# Patient Record
Sex: Female | Born: 1950 | Race: White | Hispanic: No | Marital: Married | Smoking: Never smoker
Health system: Southern US, Community
[De-identification: ages and names within clinical notes are randomized; demographics above are authoritative.]

## PROBLEM LIST (undated history)

## (undated) DIAGNOSIS — N1370 Vesicoureteral-reflux, unspecified: Secondary | ICD-10-CM

## (undated) DIAGNOSIS — N189 Chronic kidney disease, unspecified: Secondary | ICD-10-CM

## (undated) DIAGNOSIS — N12 Tubulo-interstitial nephritis, not specified as acute or chronic: Secondary | ICD-10-CM

## (undated) DIAGNOSIS — D473 Essential (hemorrhagic) thrombocythemia: Secondary | ICD-10-CM

## (undated) DIAGNOSIS — F419 Anxiety disorder, unspecified: Secondary | ICD-10-CM

## (undated) DIAGNOSIS — M8580 Other specified disorders of bone density and structure, unspecified site: Secondary | ICD-10-CM

## (undated) DIAGNOSIS — N2889 Other specified disorders of kidney and ureter: Secondary | ICD-10-CM

## (undated) DIAGNOSIS — D509 Iron deficiency anemia, unspecified: Secondary | ICD-10-CM

## (undated) DIAGNOSIS — G4730 Sleep apnea, unspecified: Secondary | ICD-10-CM

## (undated) DIAGNOSIS — M7541 Impingement syndrome of right shoulder: Secondary | ICD-10-CM

## (undated) DIAGNOSIS — D72829 Elevated white blood cell count, unspecified: Secondary | ICD-10-CM

## (undated) HISTORY — DX: Essential (hemorrhagic) thrombocythemia: D47.3

## (undated) HISTORY — PX: APPENDECTOMY: SHX54

## (undated) HISTORY — DX: Tubulo-interstitial nephritis, not specified as acute or chronic: N12

## (undated) HISTORY — DX: Other specified disorders of kidney and ureter: N28.89

## (undated) HISTORY — PX: TONSILLECTOMY: SUR1361

## (undated) HISTORY — DX: Vesicoureteral-reflux, unspecified: N13.70

## (undated) HISTORY — DX: Other specified disorders of bone density and structure, unspecified site: M85.80

## (undated) HISTORY — DX: Iron deficiency anemia, unspecified: D50.9

## (undated) HISTORY — DX: Elevated white blood cell count, unspecified: D72.829

## (undated) HISTORY — PX: KIDNEY SURGERY: SHX687

## (undated) HISTORY — PX: CHOLECYSTECTOMY: SHX55

## (undated) HISTORY — PX: ABDOMINAL HYSTERECTOMY: SHX81

## (undated) HISTORY — PX: KNEE SURGERY: SHX244

## (undated) HISTORY — PX: GASTRIC BYPASS: SHX52

---

## 1998-03-08 ENCOUNTER — Ambulatory Visit (HOSPITAL_COMMUNITY): Admission: RE | Admit: 1998-03-08 | Discharge: 1998-03-08 | Payer: Self-pay | Admitting: Specialist

## 1998-06-24 ENCOUNTER — Inpatient Hospital Stay (HOSPITAL_COMMUNITY): Admission: EM | Admit: 1998-06-24 | Discharge: 1998-06-29 | Payer: Self-pay | Admitting: Emergency Medicine

## 1998-06-24 ENCOUNTER — Encounter: Payer: Self-pay | Admitting: Emergency Medicine

## 1998-06-25 ENCOUNTER — Encounter: Payer: Self-pay | Admitting: Internal Medicine

## 1998-11-22 ENCOUNTER — Emergency Department (HOSPITAL_COMMUNITY): Admission: EM | Admit: 1998-11-22 | Discharge: 1998-11-22 | Payer: Self-pay | Admitting: Emergency Medicine

## 1998-11-22 ENCOUNTER — Encounter: Payer: Self-pay | Admitting: Emergency Medicine

## 2000-04-09 ENCOUNTER — Inpatient Hospital Stay (HOSPITAL_COMMUNITY): Admission: AD | Admit: 2000-04-09 | Discharge: 2000-04-09 | Payer: Self-pay | Admitting: Obstetrics

## 2000-04-11 ENCOUNTER — Inpatient Hospital Stay (HOSPITAL_COMMUNITY): Admission: AD | Admit: 2000-04-11 | Discharge: 2000-04-11 | Payer: Self-pay | Admitting: Obstetrics

## 2000-04-16 ENCOUNTER — Inpatient Hospital Stay (HOSPITAL_COMMUNITY): Admission: AD | Admit: 2000-04-16 | Discharge: 2000-04-16 | Payer: Self-pay | Admitting: Obstetrics

## 2000-04-16 ENCOUNTER — Encounter: Payer: Self-pay | Admitting: Obstetrics

## 2000-04-26 ENCOUNTER — Ambulatory Visit (HOSPITAL_COMMUNITY): Admission: RE | Admit: 2000-04-26 | Discharge: 2000-04-26 | Payer: Self-pay | Admitting: Urology

## 2000-04-26 ENCOUNTER — Encounter: Payer: Self-pay | Admitting: Urology

## 2001-01-07 ENCOUNTER — Inpatient Hospital Stay (HOSPITAL_COMMUNITY): Admission: AD | Admit: 2001-01-07 | Discharge: 2001-01-07 | Payer: Self-pay | Admitting: Urology

## 2001-12-02 ENCOUNTER — Emergency Department (HOSPITAL_COMMUNITY): Admission: EM | Admit: 2001-12-02 | Discharge: 2001-12-02 | Payer: Self-pay | Admitting: Emergency Medicine

## 2002-03-27 ENCOUNTER — Emergency Department (HOSPITAL_COMMUNITY): Admission: EM | Admit: 2002-03-27 | Discharge: 2002-03-27 | Payer: Self-pay | Admitting: Emergency Medicine

## 2003-07-11 ENCOUNTER — Emergency Department (HOSPITAL_COMMUNITY): Admission: EM | Admit: 2003-07-11 | Discharge: 2003-07-12 | Payer: Self-pay | Admitting: Emergency Medicine

## 2005-01-30 ENCOUNTER — Encounter: Admission: RE | Admit: 2005-01-30 | Discharge: 2005-04-02 | Payer: Self-pay

## 2005-08-07 ENCOUNTER — Encounter: Admission: RE | Admit: 2005-08-07 | Discharge: 2005-09-04 | Payer: Self-pay | Admitting: Psychiatry

## 2005-11-01 ENCOUNTER — Encounter: Admission: RE | Admit: 2005-11-01 | Discharge: 2005-11-01 | Payer: Self-pay | Admitting: Internal Medicine

## 2005-11-24 ENCOUNTER — Ambulatory Visit (HOSPITAL_COMMUNITY): Admission: RE | Admit: 2005-11-24 | Discharge: 2005-11-24 | Payer: Self-pay | Admitting: Gastroenterology

## 2005-11-24 ENCOUNTER — Encounter (INDEPENDENT_AMBULATORY_CARE_PROVIDER_SITE_OTHER): Payer: Self-pay | Admitting: *Deleted

## 2005-12-22 ENCOUNTER — Ambulatory Visit (HOSPITAL_COMMUNITY): Admission: RE | Admit: 2005-12-22 | Discharge: 2005-12-22 | Payer: Self-pay | Admitting: General Surgery

## 2005-12-27 ENCOUNTER — Ambulatory Visit (HOSPITAL_COMMUNITY): Admission: RE | Admit: 2005-12-27 | Discharge: 2005-12-27 | Payer: Self-pay | Admitting: General Surgery

## 2006-01-10 ENCOUNTER — Encounter: Admission: RE | Admit: 2006-01-10 | Discharge: 2006-04-10 | Payer: Self-pay | Admitting: General Surgery

## 2006-04-16 ENCOUNTER — Inpatient Hospital Stay (HOSPITAL_COMMUNITY): Admission: RE | Admit: 2006-04-16 | Discharge: 2006-04-18 | Payer: Self-pay | Admitting: General Surgery

## 2006-04-17 ENCOUNTER — Encounter: Payer: Self-pay | Admitting: Vascular Surgery

## 2006-04-23 ENCOUNTER — Encounter: Admission: RE | Admit: 2006-04-23 | Discharge: 2006-07-22 | Payer: Self-pay | Admitting: General Surgery

## 2006-07-07 ENCOUNTER — Emergency Department (HOSPITAL_COMMUNITY): Admission: EM | Admit: 2006-07-07 | Discharge: 2006-07-07 | Payer: Self-pay | Admitting: Emergency Medicine

## 2006-07-23 ENCOUNTER — Encounter: Admission: RE | Admit: 2006-07-23 | Discharge: 2006-10-21 | Payer: Self-pay | Admitting: General Surgery

## 2006-09-08 ENCOUNTER — Emergency Department (HOSPITAL_COMMUNITY): Admission: EM | Admit: 2006-09-08 | Discharge: 2006-09-08 | Payer: Self-pay | Admitting: Emergency Medicine

## 2007-02-14 ENCOUNTER — Encounter: Admission: RE | Admit: 2007-02-14 | Discharge: 2007-02-14 | Payer: Self-pay | Admitting: General Surgery

## 2007-05-16 ENCOUNTER — Encounter: Admission: RE | Admit: 2007-05-16 | Discharge: 2007-05-16 | Payer: Self-pay | Admitting: General Surgery

## 2010-08-19 NOTE — Op Note (Signed)
NAMEDECLYN, OFFIELD               ACCOUNT NO.:  [id number]   MEDICAL RECORD NO.:  [id number]          PATIENT TYPE:  INP   LOCATION:  0002                         FACILITY:  [facility]   PHYSICIAN:  Sharlet Salina T. Hoxworth, M.D.DATE OF BIRTH:  1950/12/23   DATE OF PROCEDURE:  04/16/2006  DATE OF DISCHARGE:                               OPERATIVE REPORT   PRE AND POSTOPERATIVE DIAGNOSIS:  Morbid obesity.   SURGICAL PROCEDURES:  Laparoscopic Roux-en-Y gastric bypass.   SURGEON:  Lorne Skeens. Hoxworth, M.D.   ASSISTANT:  Dr. Baruch Merl   ANESTHESIA:  General.   BRIEF HISTORY:  Selena May is a 60 year old female who presents with  longstanding morbid obesity unresponsive to medical management and  multiple comorbidities including sleep apnea and hypertension.  Following an extensive preoperative workup and discussion detailed  elsewhere, she is brought to the operating room for laparoscopic Roux-en-  Y gastric bypass.   DESCRIPTION OF OPERATION:  The patient brought to operating room and  placed in supine position on the operating table and general orotracheal  anesthesia was induced.  Foley catheter was placed.  She had received  preoperative IV antibiotics.  Lovenox of 40 mg had been given  subcutaneously.  The abdomen was widely sterilely prepped and draped.  Correct patient and procedure were verified.  The trocar sites were  infiltrated with local anesthesia prior to the incisions.  A 1 cm  incision was made in the left subcostal space and abdominal access  obtained with a 12 mm OptiVu trocar without difficulty and  pneumoperitoneum established.  The patient had previous open  cholecystectomy but there were no significant adhesions.  Under direct  vision, a 12 mm trocar was placed in the right subxiphoid area through  the falciform ligament another 12 mm trocar in the right upper abdomen.  A 12 mm trocar placed just to the left and above the umbilicus for the  camera port and  additional 5 mm trocar in the left flank.  The omentum  was elevated.  The transverse colon and mesocolon identified and the  ligament of Treitz clearly identified.  A 40-50 cm afferent limb was  carefully measured which allowed mesentery to be mobile enough for this  to go up over the transverse colon toward the edge of the liver.  The  small bowel was then divided at this point a single firing of the white  load 45 mm stapler.  The mesentery was mobilized a small distance  further with the harmonic scalpel.  A Penrose drain was sutured to the  end of the efferent limb for identification.  A 100 cm efferent limb was  then carefully measured.  At this point the jejunojejunostomy was  performed in side-to-side fashion with single firing of the Endo GIA  white load stapler through two enterotomies created with the harmonic  scalpel.  The staple line was inspected was intact without bleeding.  The common enterotomy was then closed from either end with running 2-0  Vicryl.  The mesenteric defect was then closed with a running 2-0 silk  suture.  The sutures and  staple lines were coated with Tisseel tissue  sealant.  The patient was then placed in reverse Trendelenburg and left  lobe of liver retracted through a 5 mm subxiphoid site using the  Nathanson's retractor with excellent exposure of the hiatus and stomach.  The angle of His was mobilized down along the left crus using the  harmonic scalpel.  A point along the lesser curve was then chosen for a  4-5 cm pouch.  The mesentery was dissected away from the lesser curve  using the harmonic scalpel and dissection carried at right angles to the  stomach toward the lesser sac.  We dissected for several centimeters  along the gastric wall without entering the free lesser sac and at this  point an Echelon 60 mm gold stapler was used to fire at right angles to  the lesser curve.  Little further dissection up beyond the staple line  did enter the  free lesser sac and the finger tractor was used to dissect  through the previously dissected area of the angle of His completely  freeing this.  Following this, two further firings of the Echelon 60 mm  blue load stapler were fired up through the previously dissected area of  the angle of His creating a nice small tubular pouch.  The last firing  was performed with the Ewald tube through the EG junction and was seen  not to impinge upon this.  The gastric remnant staple line was oversewn  with 2-0 silk.  The Roux limb was then brought up in anticolic fashion  and the candy cane facing to the patient's left was seen to come up to  the pouch without undue tension.  Then the anastomosis and created with  initial running posterior row of 2-0 Vicryl.  Following this,  enterotomies were made in the pouch and the jejunal limb with harmonic  scalpel and an approximately 2 cm anastomosis was created with the 45 mm  blue load stapler.  Staple lines were seen to be intact without  bleeding.  The common enterotomy was then closed with running 2-0 Vicryl  beginning at either end of the enterotomy and tied centrally.  The  anastomosis was then completed with an anterior row of seromuscular  running 2-0 Vicryl.  This last row was placed with the Ewald tube  advanced down through the anastomosis.  Following this, Peterson's  defect was closed suturing the edges of the Roux limb mesentery to the  transverse mesocolon and up over the transverse colon toward the gastric  remnant with 2-0 silk.  The abdomen was carefully inspected for  hemostasis and irrigated.  The Roux limb was clamped just distal to the  gastric pouch and the patient flattened and endoscopy was performed by  Dr. Colin Benton as dictated separately.  With the gastric pouch tightly  distended with air in the pouch under saline irrigation, there was no  evidence of leak.  The air was suctioned and saline suctioned. Hemostasis assured.  Tisseel was  used to coat the suture staple lines of  the gastrojejunostomy.  The Nathanson's retractor was carefully removed  all CO2 evacuated.  Trocars removed.  Sponge, needle and instrument  counts were correct.  Skin incisions were closed with staples.  Dry  sterile dressings were applied and the patient taken to recovery in good  condition.      Lorne Skeens. Hoxworth, M.D.  Electronically Signed     BTH/MEDQ  D:  04/16/2006  T:  04/16/2006  Job:  [id number]

## 2011-01-19 LAB — CBC
Hemoglobin: 12.6
RBC: 4.64
RDW: 15.1 — ABNORMAL HIGH
WBC: 11.8 — ABNORMAL HIGH

## 2011-01-19 LAB — DIFFERENTIAL
Basophils Absolute: 0.1
Lymphocytes Relative: 28
Lymphs Abs: 3.3
Monocytes Absolute: 0.7
Neutro Abs: 7.7

## 2011-11-30 ENCOUNTER — Encounter: Payer: Self-pay | Admitting: Family Medicine

## 2011-11-30 ENCOUNTER — Ambulatory Visit (INDEPENDENT_AMBULATORY_CARE_PROVIDER_SITE_OTHER): Payer: Managed Care, Other (non HMO) | Admitting: Family Medicine

## 2011-11-30 VITALS — BP 120/68 | HR 87 | Temp 98.4°F | Resp 17 | Ht 60.75 in | Wt 208.0 lb

## 2011-11-30 DIAGNOSIS — R0602 Shortness of breath: Secondary | ICD-10-CM

## 2011-11-30 DIAGNOSIS — Z0000 Encounter for general adult medical examination without abnormal findings: Secondary | ICD-10-CM

## 2011-11-30 DIAGNOSIS — N1370 Vesicoureteral-reflux, unspecified: Secondary | ICD-10-CM | POA: Insufficient documentation

## 2011-11-30 DIAGNOSIS — N2889 Other specified disorders of kidney and ureter: Secondary | ICD-10-CM

## 2011-11-30 DIAGNOSIS — R002 Palpitations: Secondary | ICD-10-CM

## 2011-11-30 HISTORY — DX: Other specified disorders of kidney and ureter: N28.89

## 2011-11-30 HISTORY — DX: Vesicoureteral-reflux, unspecified: N13.70

## 2011-11-30 NOTE — Addendum Note (Signed)
Addended by: Laren Boom on: 11/30/2011 03:55 PM   Modules accepted: Orders

## 2011-11-30 NOTE — Progress Notes (Signed)
CC: Selena May is a 61 y.o. female is here for Establish Care   Subjective: HPI:  Pleasant 61 year old who is here to establish care, has not had primary care physician for many years.  Acute complaint of a chest vibration sensation has been present for years however is been more noticeable and going for the last few weeks. It occurs every few minutes regardless of what she is doing, it can occur if she's active or just as frequently if she's resting. There has never been pain associated with it, she denies any motor or sensory disturbances when it is occurring, and denies shortness of breath surrounding these episodes. She denies any cardiac history other than being told she has a murmur. There's been no workup for this prior. Her caffeine consumption is mild to moderate. She's unsure about orthopnea she sits up in a chair because it helps her sleep for psychological reasons, regarding the passing of her husband years ago. She denies peripheral edema. She does endorse shortness of breath when walking distances such as the distance from store to her car. She also endorses what she believes to be chest tightness rarely interesting this can occur both at rest and with exertion. Denies fevers, chills, cough, wheezing, back pain, abdominal pain, GERD symptoms, bowel irregularities, nor hot flashes.  Most of her medical care has been provided by Dr. Lindley Magnus who is her nephrologist in [facility]. She tells me that there is a "spot" on her kidney it sounds like she may be admitted to the day hospital next week to have what I presume is a cystoscopy and possible biopsy. She tells me she carries a long-standing history of recurrent urinary tract infections.  History of what sounds to the gastric bypass surgery and she still is frustrated with inability to lose weight but there is no formal exercise routine.   Review Of Systems Outlined In HPI  Past Medical History  Diagnosis Date  . Renal mass 11/30/2011   . VUR (vesicoureteric reflux) 11/30/2011     Family History  Problem Relation Age of Onset  . Alzheimer's disease Mother   . COPD Mother   . Hypertension Mother   . Heart attack Mother   . Cancer Father     Lung  . Heart attack Sister   . Diabetes Sister   . Heart failure Sister   . Heart attack Brother   . Cancer Brother     Lung     History  Substance Use Topics  . Smoking status: Never Smoker   . Smokeless tobacco: Never Used  . Alcohol Use: No     Objective: Filed Vitals:   11/30/11 1505  BP: 120/68  Pulse: 87  Temp: 98.4 F (36.9 C)  Resp: 17    General: Alert and Oriented, No Acute Distress HEENT: Pupils equal, round, reactive to light. Conjunctivae clear.  External ears unremarkable, canals clear with intact TMs with appropriate landmarks.  Middle ear appears open without effusion. Pink inferior turbinates.  Moist mucous membranes, pharynx without inflammation nor lesions.  Neck supple without palpable lymphadenopathy nor abnormal masses. Lungs: Clear to auscultation bilaterally, no wheezing/ronchi/rales.  Comfortable work of breathing. Good air movement. Cardiac: Regular rate and rhythm. Normal S1/S2.  No murmurs, rubs, nor gallops.  Chest symptoms occurred while palpating radial pulse and no abnormal rhythm was noted. Bowel sounds noted while listening to her left second intercostal space just the sternum. Abdomen: soft and non tender without palpable masses. Extremities: No peripheral edema.  Strong peripheral pulses.  Mental Status: No depression, anxiety, nor agitation. Skin: Warm and dry.  Assessment & Plan: Selena May was seen today for establish care.  Diagnoses and associated orders for this visit:  Shortness of breath - CBC - B Nat Peptide  Palpitations - TSH - CBC - Basic Metabolic Panel (BMET) - B Nat Peptide - EKG 12-Lead  Analysis thyroid dysfunction, anemia, congestive heart failure, electrolyte dysfunction with the above labs. EKG was  performed interpreted by myself showing normal sinus rhythm normal rate a left axis normal intervals no pathologic Q waves or ST segment elevation or depression. Of note patient was experiencing vibration symptoms during the EKG tracing. Patient recognized the need for complete physical exam prefers to have blood work performed just prior to that as she is due for a lipid screening. Feel that this is appropriate given her normal EKG. She reports her last mammogram in 2005 she reports her last Pap smear 2005 both of which had no abnormalities for what she knows. Keep in mind possibility of hiatal hernia causing chest symptoms.   Return for CPE 2-4 weeks.  Requested Prescriptions    No prescriptions requested or ordered in this encounter

## 2011-12-06 ENCOUNTER — Telehealth: Payer: Self-pay | Admitting: *Deleted

## 2011-12-06 DIAGNOSIS — R002 Palpitations: Secondary | ICD-10-CM

## 2011-12-06 DIAGNOSIS — R0602 Shortness of breath: Secondary | ICD-10-CM

## 2011-12-15 ENCOUNTER — Telehealth: Payer: Self-pay | Admitting: Family Medicine

## 2011-12-15 ENCOUNTER — Encounter: Payer: Self-pay | Admitting: Family Medicine

## 2011-12-15 DIAGNOSIS — D473 Essential (hemorrhagic) thrombocythemia: Secondary | ICD-10-CM

## 2011-12-15 DIAGNOSIS — D509 Iron deficiency anemia, unspecified: Secondary | ICD-10-CM

## 2011-12-15 DIAGNOSIS — D75839 Thrombocytosis, unspecified: Secondary | ICD-10-CM | POA: Insufficient documentation

## 2011-12-15 DIAGNOSIS — D72829 Elevated white blood cell count, unspecified: Secondary | ICD-10-CM

## 2011-12-15 HISTORY — DX: Elevated white blood cell count, unspecified: D72.829

## 2011-12-15 HISTORY — DX: Iron deficiency anemia, unspecified: D50.9

## 2011-12-15 HISTORY — DX: Thrombocytosis, unspecified: D75.839

## 2011-12-15 LAB — BASIC METABOLIC PANEL
CO2: 23 mEq/L (ref 19–32)
Calcium: 9.1 mg/dL (ref 8.4–10.5)
Glucose, Bld: 90 mg/dL (ref 70–99)
Potassium: 5 mEq/L (ref 3.5–5.3)
Sodium: 141 mEq/L (ref 135–145)

## 2011-12-15 LAB — LIPID PANEL
HDL: 60 mg/dL (ref >39)
LDL Cholesterol: 83 mg/dL (ref 0–99)
Total CHOL/HDL Ratio: 2.6 Ratio

## 2011-12-15 LAB — CBC
Hemoglobin: 8.6 g/dL — ABNORMAL LOW (ref 12.0–15.0)
MCH: 20.1 pg — ABNORMAL LOW (ref 26.0–34.0)
RBC: 4.28 MIL/uL (ref 3.87–5.11)

## 2011-12-15 MED ORDER — FERROUS SULFATE 325 (65 FE) MG PO TABS: 325.0000 mg | ORAL_TABLET | Freq: Two times a day (BID) | ORAL | Status: DC

## 2011-12-15 NOTE — Telephone Encounter (Signed)
Discussed anemia with patient, MCV suggestive of iron deficiency. Review patient's colonoscopy history, 2007 without any suspicious lesions. Patient denies any uterine bleeding or rectal bleeding. Does admit to shortness of breath and sluggishness and palpitations. Starting iron supplementation and will repeat blood work in about 1 month we'll likely need differential at that visit.

## 2011-12-18 ENCOUNTER — Encounter: Payer: Managed Care, Other (non HMO) | Admitting: Family Medicine

## 2011-12-21 ENCOUNTER — Encounter: Payer: Self-pay | Admitting: Family Medicine

## 2011-12-21 ENCOUNTER — Ambulatory Visit (INDEPENDENT_AMBULATORY_CARE_PROVIDER_SITE_OTHER): Payer: Managed Care, Other (non HMO) | Admitting: Family Medicine

## 2011-12-21 VITALS — BP 141/79 | HR 69 | Temp 98.6°F | Wt 211.0 lb

## 2011-12-21 DIAGNOSIS — D72829 Elevated white blood cell count, unspecified: Secondary | ICD-10-CM

## 2011-12-21 DIAGNOSIS — Z0000 Encounter for general adult medical examination without abnormal findings: Secondary | ICD-10-CM

## 2011-12-21 DIAGNOSIS — D473 Essential (hemorrhagic) thrombocythemia: Secondary | ICD-10-CM

## 2011-12-21 DIAGNOSIS — Z1231 Encounter for screening mammogram for malignant neoplasm of breast: Secondary | ICD-10-CM

## 2011-12-21 DIAGNOSIS — D509 Iron deficiency anemia, unspecified: Secondary | ICD-10-CM

## 2011-12-21 DIAGNOSIS — E669 Obesity, unspecified: Secondary | ICD-10-CM

## 2011-12-21 MED ORDER — PHENTERMINE-TOPIRAMATE ER 3.75-23 MG PO CP24: 1.0000 | ORAL_CAPSULE | Freq: Every day | ORAL | Status: DC

## 2011-12-21 MED ORDER — PHENTERMINE-TOPIRAMATE ER 7.5-46 MG PO CP24: 1.0000 | ORAL_CAPSULE | Freq: Every day | ORAL | Status: DC

## 2011-12-21 NOTE — Progress Notes (Addendum)
CC: Selena May is a 61 y.o. female is here for Follow-up   Subjective: HPI:  Patient presents after missing an apartment earlier this week for CPE would like to spend time today discussing specifics from lab results and also weight loss medication.  Results obtained earlier this month were significant for a leukocytosis, anemia, and thrombocytosis. I suspect that she has an iron deficiency based on her MCV and her report that she's had high deficiency anemia in the past ever since bariatric surgery. She admits that she stopped taking iron pills approximately 3-4 months ago ever since and has felt fatigued and somewhat short of breath. She's back on taking iron 325 mg 3-4 times a day. She denies any history of bleeding, easy bruisibility, swollen lymph nodes, rectal bleeding. Her last colonoscopy was in 2005 she tells me that was normal she was given a 10 year for health. She denies feeling ill from any viral illnesses when she had her last CBC drawn with me.  She stresses interest in weight loss medication. She tries to exercise but feels that her motivation is lacking in his not optimistic that an exercise would ever be enticing. She tries to watch her she eats but has never been successful in cutting back on calories in versus calories spent. She's never been on weight loss medication before. Has no personal history of cardiac disease. She denies chest pain with exertion, palpitations(resolved since last visit), abdominal pain, nausea, vomiting, diarrhea, constipation, motor sensory disturbances, nor history of syncope.   Review Of Systems Outlined In HPI  Past Medical History  Diagnosis Date  . 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 VUR (vesicoureteric reflux) 11/30/2011  . Iron deficiency anemia 12/15/2011  . 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 Leukocytosis 12/15/2011     Family History  Problem Relation Age of Onset  . Alzheimer's disease Mother   . COPD Mother   . Hypertension  Mother   . Heart attack Mother   . Cancer Father     Lung  . Heart attack Sister   . Diabetes Sister   . Heart failure Sister   . Heart attack Brother   . Cancer Brother     Lung     History  Substance Use Topics  . Smoking status: Never Smoker   . Smokeless tobacco: Never Used  . Alcohol Use: No     Objective: Filed Vitals:   12/21/11 1458  BP: 141/79  Pulse: 69  Temp: 98.6 F (37 C)    General: Alert and Oriented, No Acute Distress HEENT: Pupils equal, round, reactive to light. Conjunctivae clear.  Moist mucous membranes, pharynx without inflammation nor lesions.  Neck supple without palpable lymphadenopathy nor abnormal masses. Lungs: Clear to auscultation bilaterally, no wheezing/ronchi/rales.  Comfortable work of breathing. Good air movement. Cardiac: Regular rate and rhythm. Normal S1/S2.  No murmurs, rubs, nor gallops.   Abdomen: Obese soft nontender Extremities: No peripheral edema.  Strong peripheral pulses.  Mental Status: No depression, anxiety, nor agitation. Skin: Warm and dry.  Assessment & Plan: Lyan was seen today for follow-up.  Diagnoses and associated orders for this visit:  Thrombocytosis (? 2/2 fe deficiency) - CBC w/Diff  Iron deficiency anemia - Iron Binding Cap (TIBC) - Ferritin - Iron  Leukocytosis - CBC w/Diff  Obesity - Phentermine-Topiramate (QSYMIA) 3.75-23 MG CP24; Take 1 capsule by mouth daily. One daily for the first 14 days. - Phentermine-Topiramate (QSYMIA) 7.5-46 MG CP24; Take 1 capsule by mouth daily.  Routine health maintenance - MM Digital Screening; Future  Other screening mammogram    I like to confirm her from her psychosis and get a better idea of what can shooting to her leukocytosis with a repeat CBC with differential add. We'll do iron studies to confirm that her anemia is secondary to an iron deficiency. She'll continue her current iron supplementation should drink more juice with this to help absorption.  We discussed the use of phentermine, topiramate and combination formulation the use of weight loss, given her BMI over 30 she's a candidate for qsymia. She's given information how to get financial [employer] medication and directed to their website. If she is tostart  this medication should back in 3 months to check her response. Regardless return within 3 months for a complete physical exam, she is due for a Pap smear, and to recheck her hemoglobin. In order for mammogram for she's not had one in decades.  Return in about 3 months (around 03/21/2012) for CPE.  Requested Prescriptions   Signed Prescriptions Disp Refills  . Phentermine-Topiramate (QSYMIA) 3.75-23 MG CP24 14 capsule 0    Sig: Take 1 capsule by mouth daily. One daily for the first 14 days.  . Phentermine-Topiramate (QSYMIA) 7.5-46 MG CP24 30 capsule 2    Sig: Take 1 capsule by mouth daily.

## 2011-12-22 ENCOUNTER — Encounter: Payer: Self-pay | Admitting: Family Medicine

## 2011-12-22 LAB — CBC WITH DIFFERENTIAL/PLATELET
Basophils Relative: 0 % (ref 0–1)
Eosinophils Absolute: 0.1 10*3/uL (ref 0.0–0.7)
HCT: 26 % — ABNORMAL LOW (ref 36.0–46.0)
Hemoglobin: 7.6 g/dL — ABNORMAL LOW (ref 12.0–15.0)
MCH: 20.1 pg — ABNORMAL LOW (ref 26.0–34.0)
MCHC: 29.2 g/dL — ABNORMAL LOW (ref 30.0–36.0)
MCV: 68.8 fL — ABNORMAL LOW (ref 78.0–100.0)
Monocytes Absolute: 0.5 10*3/uL (ref 0.1–1.0)
Monocytes Relative: 4 % (ref 3–12)

## 2011-12-22 LAB — IRON AND TIBC: Iron: 39 ug/dL — ABNORMAL LOW (ref 42–145)

## 2011-12-26 ENCOUNTER — Ambulatory Visit (INDEPENDENT_AMBULATORY_CARE_PROVIDER_SITE_OTHER): Payer: Managed Care, Other (non HMO)

## 2011-12-26 DIAGNOSIS — Z0000 Encounter for general adult medical examination without abnormal findings: Secondary | ICD-10-CM

## 2011-12-26 DIAGNOSIS — Z1231 Encounter for screening mammogram for malignant neoplasm of breast: Secondary | ICD-10-CM

## 2012-01-08 NOTE — Progress Notes (Signed)
Correction, patient needs to be seen 3-4 weeks after starting Qsymia.  This was clarified with her on 01/08/2012

## 2012-01-10 ENCOUNTER — Encounter: Payer: Self-pay | Admitting: Family Medicine

## 2012-01-10 ENCOUNTER — Ambulatory Visit (INDEPENDENT_AMBULATORY_CARE_PROVIDER_SITE_OTHER): Payer: Managed Care, Other (non HMO) | Admitting: Family Medicine

## 2012-01-10 VITALS — BP 109/63 | HR 84 | Wt 209.0 lb

## 2012-01-10 DIAGNOSIS — E669 Obesity, unspecified: Secondary | ICD-10-CM

## 2012-01-10 DIAGNOSIS — F329 Major depressive disorder, single episode, unspecified: Secondary | ICD-10-CM

## 2012-01-10 DIAGNOSIS — F325 Major depressive disorder, single episode, in full remission: Secondary | ICD-10-CM | POA: Insufficient documentation

## 2012-01-10 DIAGNOSIS — H00019 Hordeolum externum unspecified eye, unspecified eyelid: Secondary | ICD-10-CM

## 2012-01-10 MED ORDER — SERTRALINE HCL 50 MG PO TABS: 50.0000 mg | ORAL_TABLET | Freq: Every day | ORAL | Status: DC

## 2012-01-10 MED ORDER — ERYTHROMYCIN 5 MG/GM OP OINT: TOPICAL_OINTMENT | OPHTHALMIC | Status: DC

## 2012-01-10 NOTE — Progress Notes (Signed)
CC: Selena May is a 61 y.o. female is here for Follow-up   Subjective: HPI:  Patient presents for followup of weight loss treatment for her obesity. Approximately 3 weeks ago she started Qsymia has noticed a decrease desire for snacks between meals and notes that her cravings for junk food have been curbed. She denies any irregular heartbeat, palpitations, rapid heart beat, shortness of breath, peripheral edema, anxiety, paranoia, nor change in sleep habits. Unfortunately she is having today $75 each month even with some insurance coverage.  Soon encounter she begins crying stating that for the past 4-6 weeks she's had waxing and waning sadness to the point where there are days when she doesn't leave the house.  She treats these feelings to the anniversary of her sister's death, husband's death, and mother's death. Additionally she's recently informed that her daughter will be moving a hour a way. She denies suicidal thoughts or wanting to harm herself or others. She endorses a inability to fall asleep until early hours of the morning and often only sleeps for brief hours at a time. She also notes that she feels psychologically fatigued and overall just feels down. She denies periods of mania nor hallucinations. She tells me that she was on Zoloft years ago and it helped tremendously with the symptoms that she's experiencing now.  She also points out a small bump on her right lower eyelid is been present for 3 days. She's been treating this with warm compresses 4 times a day without much improvement. It is itchy and somewhat painful. She denies loss of vision nor eye pain. She denies fevers or chills but has been battling nasal and sinus congestion for 2-3 days. She denies wheezing, cough, shortness of breath. .  Review Of Systems Outlined In HPI  Past Medical History  Diagnosis Date  . 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 VUR (vesicoureteric reflux) 11/30/2011  . Iron deficiency anemia 12/15/2011  .  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 Leukocytosis 12/15/2011     Family History  Problem Relation Age of Onset  . Alzheimer's disease Mother   . COPD Mother   . Hypertension Mother   . Heart attack Mother   . Cancer Father     Lung  . Heart attack Sister   . Diabetes Sister   . Heart failure Sister   . Heart attack Brother   . Cancer Brother     Lung     History  Substance Use Topics  . Smoking status: Never Smoker   . Smokeless tobacco: Never Used  . Alcohol Use: No     Objective: Filed Vitals:   01/10/12 1453  BP: 109/63  Pulse: 84    General: Alert and Oriented, No Acute Distress HEENT: Pupils equal, round, reactive to light. Conjunctivae clear.  External ears unremarkable, canals clear with intact TMs with appropriate landmarks.  Middle ear appears open without effusion. Pink inferior turbinates.  Moist mucous membranes, pharynx without inflammation nor lesions.  Neck supple without palpable lymphadenopathy nor abnormal masses. 2 mm diameter white pustule on the right lower eyelid lid just slightly proximal to the line of eyelash follicles. Lungs: Clear to auscultation bilaterally, no wheezing/ronchi/rales.  Comfortable work of breathing. Good air movement. Cardiac: Regular rate and rhythm. Normal S1/S2.  No murmurs, rubs, nor gallops.   Extremities: No peripheral edema.  Strong peripheral pulses.    Assessment & Plan: Ally was seen today for follow-up.  Diagnoses and associated orders for this visit:  Stye - erythromycin ophthalmic ointment; Apply to lower lid up to six times a day for 10 days.  Depression  Obesity  Other Orders - sertraline (ZOLOFT) 50 MG tablet; Take 1 tablet (50 mg total) by mouth daily.    Patient contracted for safety and would like to restart Zoloft for mood stabilization. I've asked her to stay social and take advantage of the psychological support provided by her daughter and son. She'll return in 2 weeks if she's  not on any benefit from the Zoloft. She'll return sooner if symptoms deteriorate. She'll continue on Qsymia as showing no ill effects from it has even lost weight. I have asked her to return in 4 weeks for nurse visit to recheck vitals. As the stye does not get any better with warm compresses she'll continue to now and erythromycin ointment.  Will need to recheck her hemoglobin at her upcoming CPE in December she still using iron twice a day  25 minutes spent in face-to-face visit today of which 100 % was counseling or coordinating care.   Return in about 4 weeks (around 02/07/2012) for nurse visit BP/Weight/HTN.

## 2012-02-07 ENCOUNTER — Ambulatory Visit: Payer: Managed Care, Other (non HMO)

## 2012-03-20 ENCOUNTER — Encounter: Payer: Managed Care, Other (non HMO) | Admitting: Family Medicine

## 2012-03-20 DIAGNOSIS — Z0289 Encounter for other administrative examinations: Secondary | ICD-10-CM

## 2012-03-21 ENCOUNTER — Encounter: Payer: Managed Care, Other (non HMO) | Admitting: Family Medicine

## 2013-02-26 ENCOUNTER — Ambulatory Visit (INDEPENDENT_AMBULATORY_CARE_PROVIDER_SITE_OTHER): Payer: Managed Care, Other (non HMO) | Admitting: Family Medicine

## 2013-02-26 ENCOUNTER — Encounter: Payer: Self-pay | Admitting: Family Medicine

## 2013-02-26 VITALS — BP 134/68 | HR 70 | Wt 210.0 lb

## 2013-02-26 DIAGNOSIS — F329 Major depressive disorder, single episode, unspecified: Secondary | ICD-10-CM

## 2013-02-26 DIAGNOSIS — M7631 Iliotibial band syndrome, right leg: Secondary | ICD-10-CM

## 2013-02-26 DIAGNOSIS — M629 Disorder of muscle, unspecified: Secondary | ICD-10-CM

## 2013-02-26 MED ORDER — MELOXICAM 15 MG PO TABS: 15.0000 mg | ORAL_TABLET | Freq: Every day | ORAL | Status: DC | PRN

## 2013-02-26 MED ORDER — PREDNISONE 20 MG PO TABS: ORAL_TABLET | ORAL | Status: AC

## 2013-02-26 MED ORDER — SERTRALINE HCL 50 MG PO TABS: 50.0000 mg | ORAL_TABLET | Freq: Every day | ORAL | Status: DC

## 2013-02-26 NOTE — Progress Notes (Signed)
CC: Selena May is a 62 y.o. female is here for right hip pain   Subjective: HPI:  Right hip pain localized on the lateral hip radiating down to the lateral right knee. Symptoms have been present for 4 weeks on a daily basis not getting better or worsens onset. No improvement with Motrin no other intervention. Symptoms are worse with walking improves with lying down. Symptoms overall are moderate in severity a absent when sleeping she has never awoken because of discomfort. Present on a daily basis can occur any hour of the day, described only has pain. Denies weakness, motor or sensory disturbances in either lower extremity nor low back pain. She's never had this before. She reports an electric sensation that radiates from her right posterior pelvis down the lateral thigh stopping at the knee if she bends forward  Requesting refills on Zoloft. Since starting this year ago she reports drastic improvement of subjective depression, and mood swings. She stopped taking this over the summer and had return of both of the above symptoms to a moderate degree on a daily basis. Currently this is getting in the way of her quality of life. Denies anxiety  or any other  other mental disturbance   Review Of Systems Outlined In HPI  Past Medical History  Diagnosis Date  . 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 VUR (vesicoureteric reflux) 11/30/2011  . Iron deficiency anemia 12/15/2011  . 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 Leukocytosis 12/15/2011     Family History  Problem Relation Age of Onset  . Alzheimer's disease Mother   . COPD Mother   . Hypertension Mother   . Heart attack Mother   . Cancer Father     Lung  . Heart attack Sister   . Diabetes Sister   . Heart failure Sister   . Heart attack Brother   . Cancer Brother     Lung     History  Substance Use Topics  . Smoking status: Never Smoker   . Smokeless tobacco: Never Used  . Alcohol Use: No     Objective: Filed Vitals:   02/26/13 1404  BP: 134/68  Pulse: 70    General: Alert and Oriented, No Acute Distress HEENT: Pupils equal, round, reactive to light. Conjunctivae clear.   moist mucous membranes  Lungs: Clear to auscultation bilaterally, no wheezing/ronchi/rales.  Comfortable work of breathing. Good air movement. Cardiac: Regular rate and rhythm. Normal S1/S2.  No murmurs, rubs, nor gallops.   Abdomen: soft nontender  Extremities: No peripheral edema.  Strong peripheral pulses.  full range of motion strength in both lower extremities with L4 and S1 DTRs two over four bilaterally. Exam of the right leg shows negative straight leg raise, negative log roll, FABER reproduces "tightness" over greater trochanter, no SI joint tenderness with palpation on the right. Pain is completely reproduced with palpation of right greater trochanter and or distal right femoral condyle Back: No midline spinous process tenderness to palpation  Mental Status: No depression, anxiety, nor agitation. Skin: Warm and dry.  Assessment & Plan: Selena May was seen today for right hip pain.  Diagnoses and associated orders for this visit:  Iliotibial band syndrome, right - predniSONE (DELTASONE) 20 MG tablet; Three tabs at once daily for five days. - meloxicam (MOBIC) 15 MG tablet; Take 1 tablet (15 mg total) by mouth daily as needed for pain.  Depression - sertraline (ZOLOFT) 50 MG tablet; Take 1 tablet (50 mg total) by mouth daily.  iliotibial band syndrome: Patient would prefer to avoid injection therefore will try prednisone burst and meloxicam. Given handout on iliotibial band home exercise regimen to be performed on a daily basis for the next 3 weeks. If absolutely no improvement in one week return to see Dr. Karie Schwalbe. for sports medicine referral for confirmation of diagnosis Depression: Uncontrolled chronic condition due to stopping Zoloft, restart Zoloft return in 4 weeks for reassessment of depression   Return in about 4 weeks  (around 03/26/2013).

## 2013-04-13 DIAGNOSIS — R55 Syncope and collapse: Secondary | ICD-10-CM | POA: Insufficient documentation

## 2013-04-18 ENCOUNTER — Encounter: Payer: Self-pay | Admitting: Family Medicine

## 2013-04-18 ENCOUNTER — Ambulatory Visit (INDEPENDENT_AMBULATORY_CARE_PROVIDER_SITE_OTHER): Payer: Managed Care, Other (non HMO) | Admitting: Family Medicine

## 2013-04-18 VITALS — BP 121/72 | HR 77 | Wt 204.0 lb

## 2013-04-18 DIAGNOSIS — Z4802 Encounter for removal of sutures: Secondary | ICD-10-CM

## 2013-04-18 DIAGNOSIS — R0789 Other chest pain: Secondary | ICD-10-CM

## 2013-04-18 DIAGNOSIS — R071 Chest pain on breathing: Secondary | ICD-10-CM

## 2013-04-18 DIAGNOSIS — G4700 Insomnia, unspecified: Secondary | ICD-10-CM

## 2013-04-18 DIAGNOSIS — R55 Syncope and collapse: Secondary | ICD-10-CM

## 2013-04-18 MED ORDER — ESZOPICLONE 2 MG PO TABS: 2.0000 mg | ORAL_TABLET | Freq: Every evening | ORAL | Status: DC | PRN

## 2013-04-18 MED ORDER — CYCLOBENZAPRINE HCL 10 MG PO TABS: ORAL_TABLET | ORAL | Status: DC

## 2013-04-18 MED ORDER — TRAMADOL HCL 50 MG PO TABS: 50.0000 mg | ORAL_TABLET | Freq: Four times a day (QID) | ORAL | Status: DC | PRN

## 2013-04-18 NOTE — Progress Notes (Signed)
CC: Selena May is a 63 y.o. female is here for Suture / Staple Removal   Subjective: HPI:  Patient presents for hospital followup last week she had episode where she was taking her trash out and the next thing she knew she was sitting in her living room with blood coming down her right forehead. On the porch outside there was a small puddle of blood, she's not sure, time passed between taking up the trash and when she came to in her living room. She was admitted at [facility] on review of her workup she had a normal CT of the brain, normal CT of the facial bones, unremarkable chest x-ray with rib views on the right, a normal EEG, unremarkable echocardiogram, negative serial troponins and no significant abnormalities. There is no conclusion as to what caused her amnesia.  She was left with 4 small sutures above her right eye that had been there for 6 days without any pain or discharge at the site of the laceration.  She denies any lightheadedness, presyncopal episodes, nor amnesia ever since discharge earlier this week.  She complains today of right chest wall pain that is described as moderate pain that radiates from the right axilla radiating into the right breast that occurs with coughing or any sudden movement of her right upper extremity. She's tried ibuprofen without much benefit no other interventions as of yet. Denies any breast complaints with respect to anatomy architecture or overlying skin changes.  She also states that her sleep pattern has been disrupted ever since being discharged she has been taking leftover Lunesta that was prescribed many months ago by another provider. Provided she takes this medication before she goes to bed she reports sleep disturbance as well managed.   Review Of Systems Outlined In HPI  Past Medical History  Diagnosis Date  . 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 VUR (vesicoureteric reflux) 11/30/2011  . Iron deficiency anemia 12/15/2011  .  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 Leukocytosis 12/15/2011     Family History  Problem Relation Age of Onset  . Alzheimer's disease Mother   . COPD Mother   . Hypertension Mother   . Heart attack Mother   . Cancer Father     Lung  . Heart attack Sister   . Diabetes Sister   . Heart failure Sister   . Heart attack Brother   . Cancer Brother     Lung     History  Substance Use Topics  . Smoking status: Never Smoker   . Smokeless tobacco: Never Used  . Alcohol Use: No     Objective: Filed Vitals:   04/18/13 0908  BP: 121/72  Pulse: 77    General: Alert and Oriented, No Acute Distress HEENT: Pupils equal, round, reactive to light. Conjunctivae clear.  External ears unremarkable, canals clear with intact TMs with appropriate landmarks.  Middle ear appears open without effusion. Pink inferior turbinates.  Moist mucous membranes, pharynx without inflammation nor lesions.  Neck supple without palpable lymphadenopathy nor abnormal masses. Mild ecchymosis underneath the right eye overlying the maxilla just below the lateral eyebrow there is a well-healed well approximated laceration with 4 sutures Lungs: Clear to auscultation bilaterally, no wheezing/ronchi/rales.  Comfortable work of breathing. Good air movement. Cardiac: Regular rate and rhythm. Normal S1/S2.  No murmurs, rubs, nor gallops.   MSK: Pain is reproduced with activation of right pectoralis, right shoulder exam shows a negative Hawkins/Neer/crossarm/empty can Extremities: No peripheral edema.  Strong peripheral pulses.  Mental Status: No depression, anxiety, nor agitation. Skin: Warm and dry.  Assessment & Plan: Tyera was seen today for suture / staple removal.  Diagnoses and associated orders for this visit:  Chest wall pain - cyclobenzaprine (FLEXERIL) 10 MG tablet; Take a half to a full tab every 8-12 hours only as needed for chest discomfort, may cause sedation. - traMADol (ULTRAM) 50 MG tablet;  Take 1 tablet (50 mg total) by mouth every 6 (six) hours as needed for moderate pain.  Visit for suture removal  Syncope  Insomnia - eszopiclone (LUNESTA) 2 MG TABS tablet; Take 1 tablet (2 mg total) by mouth at bedtime as needed for sleep. Take immediately before bedtime    Chest wall pain: Discussed the patient I suspect this is due to pectoralis strain thankfully an x-ray has already been obtained which was unremarkable Suture removal: 4 sutures beneath the right eyebrow were fully removed without difficulty Syncope: The only thing I can add which she will consider would be a Holter monitor however suspect low yield on this since she was on telemetry without any reported abnormalities Insomnia: Uncontrolled likely due to recent inpatient stay, refilled as needed Lunesta  40 minutes spent face-to-face during visit today of which at least 50% was counseling or coordinating care regarding: 1. Chest wall pain   2. Visit for suture removal   3. Syncope   4. Insomnia      Return if symptoms worsen or fail to improve.

## 2013-07-14 ENCOUNTER — Ambulatory Visit (INDEPENDENT_AMBULATORY_CARE_PROVIDER_SITE_OTHER): Payer: Managed Care, Other (non HMO) | Admitting: Family Medicine

## 2013-07-14 ENCOUNTER — Encounter: Payer: Self-pay | Admitting: Family Medicine

## 2013-07-14 VITALS — BP 118/67 | HR 73 | Temp 98.1°F | Wt 206.0 lb

## 2013-07-14 DIAGNOSIS — L309 Dermatitis, unspecified: Secondary | ICD-10-CM

## 2013-07-14 DIAGNOSIS — L259 Unspecified contact dermatitis, unspecified cause: Secondary | ICD-10-CM

## 2013-07-14 MED ORDER — HYDROXYZINE HCL 50 MG PO TABS: 50.0000 mg | ORAL_TABLET | Freq: Three times a day (TID) | ORAL | Status: DC | PRN

## 2013-07-14 MED ORDER — METHYLPREDNISOLONE SODIUM SUCC 125 MG IJ SOLR
125.0000 mg | Freq: Once | INTRAMUSCULAR | Status: AC
  Administered 2013-07-14: 125 mg via INTRAMUSCULAR

## 2013-07-14 MED ORDER — PREDNISONE 20 MG PO TABS: ORAL_TABLET | ORAL | Status: AC

## 2013-07-14 NOTE — Progress Notes (Signed)
CC: Selena May is a 63 y.o. female is here for Rash   Subjective: HPI:  Patient complains of a rash that began on her arms about a week ago that has now been spreading to involve her trunk, back, scalp, ears, and proximal legs. It is moderately itchy, slightly improved with antihistamines. No other interventions as of yet. No new personal care products, exposure to new environments, nor has she noticed anybody else in the home with a similar rash.  Has had similar rash over 10 years ago. She denies any new medications, fevers, chills, flushing, shortness of breath, GI disturbance.  Review Of Systems Outlined In HPI  Past Medical History  Diagnosis Date  . 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 VUR (vesicoureteric reflux) 11/30/2011  . Iron deficiency anemia 12/15/2011  . 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 Leukocytosis 12/15/2011    Past Surgical History  Procedure Laterality Date  . Cholecystectomy    . Abdominal hysterectomy    . Appendectomy    . Tonsillectomy    . Gastric bypass    . Knee surgery      left x 2 right x 1  . Kidney surgery     Family History  Problem Relation Age of Onset  . Alzheimer's disease Mother   . COPD Mother   . Hypertension Mother   . Heart attack Mother   . Cancer Father     Lung  . Heart attack Sister   . Diabetes Sister   . Heart failure Sister   . Heart attack Brother   . Cancer Brother     Lung    History   Social History  . Marital Status: Widowed    Spouse Name: N/A    Number of Children: N/A  . Years of Education: N/A   Occupational History  . Not on file.   Social History Main Topics  . Smoking status: Never Smoker   . Smokeless tobacco: Never Used  . Alcohol Use: No  . Drug Use: No  . Sexual Activity: No   Other Topics Concern  . Not on file   Social History Narrative  . No narrative on file     Objective: BP 118/67  Pulse 73  Temp(Src) 98.1 F (36.7 C) (Oral)  Wt 206 lb (93.441 kg)  General: Alert and  Oriented, No Acute Distress HEENT: Pupils equal, round, reactive to light. Conjunctivae clear.  Moist membranes aren't unremarkable Lungs: Clear to auscultation bilaterally, no wheezing/ronchi/rales.  Comfortable work of breathing. Good air movement. Cardiac: Regular rate and rhythm. Normal S1/S2.  No murmurs, rubs, nor gallops.   Extremities: No peripheral edema.  Strong peripheral pulses.  Mental Status: No depression, anxiety, nor agitation. Skin: Warm and dry. Involving the trunk, axilla, scalp, external ears, back and mildly on the proximal forearms there is a erythematous macular papular rash with overlying scaling with mild excoriations.  Assessment & Plan: Selena May was seen today for rash.  Diagnoses and associated orders for this visit:  Dermatitis - predniSONE (DELTASONE) 20 MG tablet; Three tabs daily days 1-3, two tabs daily days 4-6, one tab daily days 7-9, half tab daily days 10-13. - hydrOXYzine (ATARAX/VISTARIL) 50 MG tablet; Take 1 tablet (50 mg total) by mouth 3 (three) times daily as needed for itching.    Dermatitis: Suspect psoriasis, start prednisone taper and as needed hydroxyzine she was given 125 mg of Solu-Medrol here in the office  Return if symptoms worsen or fail to improve.

## 2013-07-14 NOTE — Addendum Note (Signed)
Addended by: Terance Hart on: 07/14/2013 04:09 PM   Modules accepted: Orders

## 2013-07-22 ENCOUNTER — Telehealth: Payer: Self-pay | Admitting: *Deleted

## 2013-07-22 DIAGNOSIS — L309 Dermatitis, unspecified: Secondary | ICD-10-CM

## 2013-07-22 MED ORDER — PREDNISONE 20 MG PO TABS: ORAL_TABLET | ORAL | Status: AC

## 2013-07-22 MED ORDER — TRIAMCINOLONE ACETONIDE 0.1 % EX CREA: TOPICAL_CREAM | CUTANEOUS | Status: DC

## 2013-07-22 NOTE — Telephone Encounter (Signed)
Selena May, I'd recommend she restart a prednisone taper and use topical triamcinolone cream as well only on affected areas avoiding the face.  At this point it warrants a referral to dermatology so I've put in a urgent referral for further management.  Please let me know if not contacted about an appointment by the end of this week.  (Rx sent to her pharm)

## 2013-07-22 NOTE — Telephone Encounter (Signed)
LMOM to return call. Clemetine Marker, LPN

## 2013-07-22 NOTE — Telephone Encounter (Signed)
Pt notified of instructions. Clemetine Marker, LPN

## 2013-07-22 NOTE — Telephone Encounter (Signed)
Patient calls and states that the rash she had is still there and did not get much better with the medicine.  Wants to know if you can give her a different medication to help rash go away?

## 2013-08-01 ENCOUNTER — Encounter: Payer: Self-pay | Admitting: Family Medicine

## 2013-08-01 ENCOUNTER — Ambulatory Visit (INDEPENDENT_AMBULATORY_CARE_PROVIDER_SITE_OTHER): Payer: Managed Care, Other (non HMO) | Admitting: Family Medicine

## 2013-08-01 VITALS — BP 113/61 | HR 60 | Temp 97.5°F | Wt 208.0 lb

## 2013-08-01 DIAGNOSIS — J189 Pneumonia, unspecified organism: Secondary | ICD-10-CM

## 2013-08-01 MED ORDER — AMOXICILLIN 500 MG PO TABS: 1000.0000 mg | ORAL_TABLET | Freq: Three times a day (TID) | ORAL | Status: DC

## 2013-08-01 MED ORDER — AZITHROMYCIN 250 MG PO TABS: ORAL_TABLET | ORAL | Status: AC

## 2013-08-01 NOTE — Progress Notes (Signed)
CC: IMBERLY TROXLER is a 63 y.o. female is here for hospital f/u   Subjective: HPI:  Patient complains of fatigue, fever with maximum temperature 103.0, productive cough, shortness of breath with rest and exertion that has been present for the past week. She was seen at a local emergency room on Monday given a gram of Rocephin and started on clindamycin however she doesn't feel like she is improved whatsoever. She had a white count that was mildly elevated but chest x-ray, troponins, metabolic panel, EKG was all normal. She reports chest pressure that has been present for the past week but no other chest pain. Review of systems is positive for chills, decreased appetite. Nothing particularly makes above symptoms worse other than exertional shortness of breath. Symptoms are present all hours of the day and interfere with sleep. She does describe symptoms of moderate in severity.  Denies confusion, vomiting, nausea, diarrhea, abdominal pain, nor new skin complaints. No genitourinary complaints   Review Of Systems Outlined In HPI  Past Medical History  Diagnosis Date  . 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 VUR (vesicoureteric reflux) 11/30/2011  . Iron deficiency anemia 12/15/2011  . 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 Leukocytosis 12/15/2011    Past Surgical History  Procedure Laterality Date  . Cholecystectomy    . Abdominal hysterectomy    . Appendectomy    . Tonsillectomy    . Gastric bypass    . Knee surgery      left x 2 right x 1  . Kidney surgery     Family History  Problem Relation Age of Onset  . Alzheimer's disease Mother   . COPD Mother   . Hypertension Mother   . Heart attack Mother   . Cancer Father     Lung  . Heart attack Sister   . Diabetes Sister   . Heart failure Sister   . Heart attack Brother   . Cancer Brother     Lung    History   Social History  . Marital Status: Widowed    Spouse Name: N/A    Number of Children: N/A  . Years of Education: N/A    Occupational History  . Not on file.   Social History Main Topics  . Smoking status: Never Smoker   . Smokeless tobacco: Never Used  . Alcohol Use: No  . Drug Use: No  . Sexual Activity: No   Other Topics Concern  . Not on file   Social History Narrative  . No narrative on file     Objective: BP 113/61  Pulse 60  Temp(Src) 97.5 F (36.4 C) (Oral)  Wt 208 lb (94.348 kg)  SpO2 98%  General: Alert and Oriented, No Acute Distress however appears mildly fatigued HEENT: Pupils equal, round, reactive to light. Conjunctivae clear.  External ears unremarkable, canals clear with intact TMs with appropriate landmarks.  Middle ear appears open without effusion. Pink inferior turbinates.  Moist mucous membranes, pharynx without inflammation nor lesions.  Neck supple without palpable lymphadenopathy nor abnormal masses. Lungs: Comfortable work of breathing with Rales in left lower lung lobe, no rhonchi or wheeze. Cardiac: Regular rate and rhythm. Normal S1/S2.  No murmurs, rubs, nor gallops.   Extremities: No peripheral edema.  Strong peripheral pulses.  Mental Status: No depression, anxiety, nor agitation. Skin: Warm and dry.  Assessment & Plan: Selena May was seen today for hospital f/u.  Diagnoses and associated orders for this visit:  CAP (community acquired pneumonia) -  azithromycin (ZITHROMAX) 250 MG tablet; Take two tabs at once on day 1, then one tab daily on days 2-5. - amoxicillin (AMOXIL) 500 MG tablet; Take 2 tablets (1,000 mg total) by mouth 3 (three) times daily.    Highly suspect community-acquired pneumonia, she has seizures with Cipro therefore instead of levofloxacin using the above combination. 1 g of Rocephin given today an office. Contact me if no better by Monday. Signs and symptoms requring emergent/urgent reevaluation were discussed with the patient.   Return if symptoms worsen or fail to improve.

## 2013-09-04 ENCOUNTER — Ambulatory Visit: Payer: Managed Care, Other (non HMO) | Admitting: Family Medicine

## 2013-09-04 ENCOUNTER — Telehealth: Payer: Self-pay | Admitting: Family Medicine

## 2013-09-04 DIAGNOSIS — Z0289 Encounter for other administrative examinations: Secondary | ICD-10-CM

## 2013-09-04 NOTE — Telephone Encounter (Addendum)
Mardene Celeste, Can you please ask the patient if she is interested in re-scheduling her missed visit on Thursday?    Patient has been deemed a "no-show" for today's scheduled appointment.  Based on chief complaint and my chart review:  -Follow-up advised.  Contacting patient and urged to schedule visit in 1-2 weeks PRN.  If necessary this was sent to my support staff to be communicated to the patient via phone or mail on: 7:50am @TODAY @

## 2013-09-05 ENCOUNTER — Other Ambulatory Visit: Payer: Self-pay | Admitting: Family Medicine

## 2013-09-05 NOTE — Telephone Encounter (Signed)
Left vm for pt

## 2013-09-09 NOTE — Telephone Encounter (Signed)
Left vm msg

## 2013-09-11 NOTE — Telephone Encounter (Signed)
So f

## 2013-09-15 ENCOUNTER — Encounter: Payer: Self-pay | Admitting: Family Medicine

## 2013-09-15 ENCOUNTER — Ambulatory Visit (INDEPENDENT_AMBULATORY_CARE_PROVIDER_SITE_OTHER): Payer: Managed Care, Other (non HMO)

## 2013-09-15 ENCOUNTER — Ambulatory Visit (INDEPENDENT_AMBULATORY_CARE_PROVIDER_SITE_OTHER): Payer: Managed Care, Other (non HMO) | Admitting: Family Medicine

## 2013-09-15 VITALS — BP 117/60 | HR 65 | Wt 207.0 lb

## 2013-09-15 DIAGNOSIS — M25561 Pain in right knee: Secondary | ICD-10-CM

## 2013-09-15 DIAGNOSIS — M25569 Pain in unspecified knee: Secondary | ICD-10-CM

## 2013-09-15 DIAGNOSIS — G4700 Insomnia, unspecified: Secondary | ICD-10-CM

## 2013-09-15 MED ORDER — MELOXICAM 15 MG PO TABS: 15.0000 mg | ORAL_TABLET | Freq: Every day | ORAL | Status: DC

## 2013-09-15 MED ORDER — ESZOPICLONE 2 MG PO TABS: 2.0000 mg | ORAL_TABLET | Freq: Every evening | ORAL | Status: DC | PRN

## 2013-09-15 NOTE — Progress Notes (Signed)
CC: Selena May is a 63 y.o. female is here for Knee Pain right   Subjective: HPI:  Right knee pain that has been present on a daily basis for the past month. Worse first thing in the morning and after periods of inactivity. Swelling is present when pain is present and also occurs the longer she uses it while walking or standing for long periods of time. Pain is localized anterior and medially with radiation into the back of the knee. It has locked up on her multiple times while going downstairs. She denies giving way. Swelling has been present and described as moderate in severity. Symptoms above do improve with Aleve, when not taking Aleve symptoms are moderate in severity. She denies any recent or remote trauma to this joint. Denies fevers, chills, swelling elsewhere in the body, hip or ankle pain.   She requests a refill on Lunesta. She's got a few weeks of not taking the medication to see if she still needs and has difficulty with falling asleep and difficulty staying asleep. Symptoms are moderate in severity she often will not go up also to call for in the morning. Nothing particularly keeps her awake at she denies anxiety, pain, mental disturbance.   Review Of Systems Outlined In HPI  Past Medical History  Diagnosis Date  . 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 VUR (vesicoureteric reflux) 11/30/2011  . Iron deficiency anemia 12/15/2011  . 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 Leukocytosis 12/15/2011    Past Surgical History  Procedure Laterality Date  . Cholecystectomy    . Abdominal hysterectomy    . Appendectomy    . Tonsillectomy    . Gastric bypass    . Knee surgery      left x 2 right x 1  . Kidney surgery     Family History  Problem Relation Age of Onset  . Alzheimer's disease Mother   . COPD Mother   . Hypertension Mother   . Heart attack Mother   . Cancer Father     Lung  . Heart attack Sister   . Diabetes Sister   . Heart failure Sister   . Heart attack  Brother   . Cancer Brother     Lung    History   Social History  . Marital Status: Widowed    Spouse Name: N/A    Number of Children: N/A  . Years of Education: N/A   Occupational History  . Not on file.   Social History Main Topics  . Smoking status: Never Smoker   . Smokeless tobacco: Never Used  . Alcohol Use: No  . Drug Use: No  . Sexual Activity: No   Other Topics Concern  . Not on file   Social History Narrative  . No narrative on file     Objective: BP 117/60  Pulse 65  Wt 207 lb (93.895 kg)  General: Alert and Oriented, No Acute Distress HEENT: Pupils equal, round, reactive to light. Conjunctivae clear.  Moist membranes pharynx unremarkable Lungs: Clear comfortable work of breathing Cardiac: Regular rate and rhythm. Extremities: No peripheral edema.  Strong peripheral pulses. Right knee exam shows full-strength and range of motion. There is mild medial anterior swelling but no redness, nor warmth overlying the knee.  No patellar crepitus. No patellar apprehension. No pain with palpation of the inferior patellar pole.  No  laxity with valgus nor varus stress however pain is reproduced with valgus stress. Anterior drawer is negative. McMurray's negative.  Moderate medial popliteal space tenderness to outpatient without palpable mass. No medial or lateral joint line tenderness to palpation. Mental Status: No depression, anxiety, nor agitation. Skin: Warm and dry.  Assessment & Plan: Selena May was seen today for knee pain right.  Diagnoses and associated orders for this visit:  Right knee pain - DG Knee Complete 4 Views Right; Future - meloxicam (MOBIC) 15 MG tablet; Take 1 tablet (15 mg total) by mouth daily. For knee pain.  Insomnia - eszopiclone (LUNESTA) 2 MG TABS tablet; Take 1 tablet (2 mg total) by mouth at bedtime as needed for sleep. Take immediately before bedtime     Insomnia: Uncontrolled restart Lunesta Right knee pain: Personal interpretation  shows mild osteoarthritis of the patella but otherwise joint spaces do not show degree of degeneration that would be consistent with the degree of her pain. I suspect that this is coming from a meniscal tear. We will treat conservatively with home exercises/stretches, meloxicam, and followup with Dr. Darene Lamer. in sports medicine if no better after 3-4 weeks.  Return if symptoms worsen or fail to improve.

## 2013-10-12 ENCOUNTER — Other Ambulatory Visit: Payer: Self-pay | Admitting: Family Medicine

## 2013-11-13 ENCOUNTER — Other Ambulatory Visit: Payer: Self-pay | Admitting: Family Medicine

## 2013-12-07 ENCOUNTER — Other Ambulatory Visit: Payer: Self-pay | Admitting: Family Medicine

## 2014-01-05 ENCOUNTER — Encounter: Payer: Self-pay | Admitting: Family Medicine

## 2014-01-05 ENCOUNTER — Ambulatory Visit (INDEPENDENT_AMBULATORY_CARE_PROVIDER_SITE_OTHER): Payer: Managed Care, Other (non HMO) | Admitting: Family Medicine

## 2014-01-05 VITALS — BP 138/72 | HR 75 | Ht 60.0 in | Wt 210.0 lb

## 2014-01-05 DIAGNOSIS — F32A Depression, unspecified: Secondary | ICD-10-CM

## 2014-01-05 DIAGNOSIS — N3001 Acute cystitis with hematuria: Secondary | ICD-10-CM

## 2014-01-05 DIAGNOSIS — M25552 Pain in left hip: Secondary | ICD-10-CM

## 2014-01-05 DIAGNOSIS — F329 Major depressive disorder, single episode, unspecified: Secondary | ICD-10-CM

## 2014-01-05 LAB — POCT URINALYSIS DIPSTICK
BILIRUBIN UA: NEGATIVE
GLUCOSE UA: NEGATIVE
KETONES UA: NEGATIVE
Nitrite, UA: NEGATIVE
Protein, UA: 100
Spec Grav, UA: 1.02
Urobilinogen, UA: 0.2
pH, UA: 5.5

## 2014-01-05 MED ORDER — TRAMADOL HCL 50 MG PO TABS: 50.0000 mg | ORAL_TABLET | Freq: Three times a day (TID) | ORAL | Status: DC | PRN

## 2014-01-05 MED ORDER — NITROFURANTOIN MONOHYD MACRO 100 MG PO CAPS: 100.0000 mg | ORAL_CAPSULE | Freq: Two times a day (BID) | ORAL | Status: AC

## 2014-01-05 MED ORDER — SERTRALINE HCL 50 MG PO TABS: ORAL_TABLET | ORAL | Status: DC

## 2014-01-05 NOTE — Progress Notes (Signed)
CC: Selena May is a 63 y.o. female is here for Urinary Tract Infection   Subjective: HPI:  2 days of dysuria described as burning in the bladder when urinating. Accompanied by cloudy urine.  No interventions as of yet. Symptoms are moderate in severity worse in a daily basis. She denies flank pain, fevers, chills, blood in urine or any other genitourinary complaints  Requesting refill on Zoloft for which she's been taking for irritability and depression. She ran out of this medication a few weeks ago and others began to notice that she was much more irritable and she believes that others were getting on her nerves to a degree that was interfering with quality of life. She denies any thoughts or wanting to harm herself or others while taking Zoloft.  Complains of continued left hip pain localized on the lateral aspect of the hip that radiates down the lateral aspect of the leg into the knee. She can go for 2-3 weeks without any pain and then have stabbing pain for 2-3 days that resolved without any intervention. When pain is present it is moderate to severe in severity and she pretty much just sits around the house during these days. No benefit from Aleve or ibuprofen. She tells me the character and severity of the pain has not changed for the past one or 2 years. Denies groin pain.   Review Of Systems Outlined In HPI  Past Medical History  Diagnosis Date  . 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 VUR (vesicoureteric reflux) 11/30/2011  . Iron deficiency anemia 12/15/2011  . 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 Leukocytosis 12/15/2011    Past Surgical History  Procedure Laterality Date  . Cholecystectomy    . Abdominal hysterectomy    . Appendectomy    . Tonsillectomy    . Gastric bypass    . Knee surgery      left x 2 right x 1  . Kidney surgery     Family History  Problem Relation Age of Onset  . Alzheimer's disease Mother   . COPD Mother   . Hypertension Mother   . Heart  attack Mother   . Cancer Father     Lung  . Heart attack Sister   . Diabetes Sister   . Heart failure Sister   . Heart attack Brother   . Cancer Brother     Lung    History   Social History  . Marital Status: Widowed    Spouse Name: N/A    Number of Children: N/A  . Years of Education: N/A   Occupational History  . Not on file.   Social History Main Topics  . Smoking status: Never Smoker   . Smokeless tobacco: Never Used  . Alcohol Use: No  . Drug Use: No  . Sexual Activity: No   Other Topics Concern  . Not on file   Social History Narrative  . No narrative on file     Objective: BP 138/72  Pulse 75  Ht 5' (1.524 m)  Wt 210 lb (95.255 kg)  BMI 41.01 kg/m2  Vital signs reviewed. General: Alert and Oriented, No Acute Distress HEENT: Pupils equal, round, reactive to light. Conjunctivae clear.  External ears unremarkable.  Moist mucous membranes. Lungs: Clear and comfortable work of breathing, speaking in full sentences without accessory muscle use. Cardiac: Regular rate and rhythm.  Neuro: CN II-XII grossly intact, gait normal. Extremities: No peripheral edema.  Strong peripheral pulses. Full range of  motion and strength of the left lower extremity Mental Status: No depression, anxiety, nor agitation. Logical though process. Skin: Warm and dry.  Assessment & Plan: Adely was seen today for urinary tract infection.  Diagnoses and associated orders for this visit:  Acute cystitis with hematuria - POCT urinalysis dipstick - Urine Culture - nitrofurantoin, macrocrystal-monohydrate, (MACROBID) 100 MG capsule; Take 1 capsule (100 mg total) by mouth 2 (two) times daily.  Left hip pain - traMADol (ULTRAM) 50 MG tablet; Take 1 tablet (50 mg total) by mouth every 8 (eight) hours as needed for moderate pain.  Depression - sertraline (ZOLOFT) 50 MG tablet; TAKE 1 TABLET BY MOUTH DAILY.    Acute cystitis: UA suggestive of urinary tract infection, Start Macrobid, I  will follow her culture Left hip pain: Provided with tramadol to help mask the pain, asked her to followup with me or Dr. Darene Lamer. in sports medicine for it as a dedicated to this since she was scheduled for a UTI visit today only Depression: Uncontrolled while off of the Zoloft, restart her former dose of 50 mg daily   Return in about 6 months (around 07/07/2014) for depression.

## 2014-01-08 LAB — URINE CULTURE

## 2014-05-07 ENCOUNTER — Other Ambulatory Visit: Payer: Self-pay | Admitting: Family Medicine

## 2014-08-03 ENCOUNTER — Encounter: Payer: Self-pay | Admitting: Family Medicine

## 2014-08-03 ENCOUNTER — Ambulatory Visit (INDEPENDENT_AMBULATORY_CARE_PROVIDER_SITE_OTHER): Payer: Managed Care, Other (non HMO) | Admitting: Family Medicine

## 2014-08-03 ENCOUNTER — Ambulatory Visit (INDEPENDENT_AMBULATORY_CARE_PROVIDER_SITE_OTHER): Payer: Managed Care, Other (non HMO)

## 2014-08-03 VITALS — BP 142/70 | HR 77 | Ht 69.0 in | Wt 212.0 lb

## 2014-08-03 DIAGNOSIS — M25571 Pain in right ankle and joints of right foot: Secondary | ICD-10-CM

## 2014-08-03 DIAGNOSIS — R252 Cramp and spasm: Secondary | ICD-10-CM

## 2014-08-03 NOTE — Progress Notes (Signed)
CC: Selena May is a 64 y.o. female is here for left leg pain and right foot cramp and give way   Subjective: HPI:  Posterior ankle pain that has been present for 2-3 weeks on a daily basis. Slowly worsening. It's worse with walking or dancing. It improves if she is wearing a Data processing manager. She tells me that it's a sense of instability in the back of the ankle and occasionally goes out. She has difficulty describing"giving out" further. 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 Pain is moderate in severity no interventions as of yet. Denies any overlying skin changes nor joint pain elsewhere. Denies swelling redness or warmth of the foot.  Complains of cramping in the left thigh. It's been present on a daily basis off and on for the last few months. Symptoms are mild in severity. It improves with massaging. No other interventions as of yet. Nothing provokes this that she knows of. It has not been accompanied by any other motor or sensory disturbances. Denies swelling of either lower extremity, denies any decreased range of motion. Denies any limb claudication  Review Of Systems Outlined In HPI  Past Medical History  Diagnosis Date  . 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 VUR (vesicoureteric reflux) 11/30/2011  . Iron deficiency anemia 12/15/2011  . 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 Leukocytosis 12/15/2011    Past Surgical History  Procedure Laterality Date  . Cholecystectomy    . Abdominal hysterectomy    . Appendectomy    . Tonsillectomy    . Gastric bypass    . Knee surgery      left x 2 right x 1  . Kidney surgery     Family History  Problem Relation Age of Onset  . Alzheimer's disease Mother   . COPD Mother   . Hypertension Mother   . Heart attack Mother   . Cancer Father     Lung  . Heart attack Sister   . Diabetes Sister   . Heart failure Sister   . Heart attack Brother   . Cancer Brother    Lung    History   Social History  . Marital Status: Widowed    Spouse Name: N/A  . Number of Children: N/A  . Years of Education: N/A   Occupational History  . Not on file.   Social History Main Topics  . Smoking status: Never Smoker   . Smokeless tobacco: Never Used  . Alcohol Use: No  . Drug Use: No  . Sexual Activity: No   Other Topics Concern  . Not on file   Social History Narrative     Objective: BP 142/70 mmHg  Pulse 77  Ht 5\' 9"  (1.753 m)  Wt 212 lb (96.163 kg)  BMI 31.29 kg/m2  General: Alert and Oriented, No Acute Distress HEENT: Pupils equal, round, reactive to light. Conjunctivae clear. Wiseman's membranes  Lungs:Clear comfortable work of breathing  Cardiac: Regular rate and rhythm.  Extremities: No peripheral edema.  Strong peripheral pulses. Current emotional strength in both lower extreme venous.  Right ankle exam: Pain reproduced with anterior drawer, passive inversion, resisted plantar flexion. Pain is reproduced with palpation of the posterior calcaneus however radiates on the bottom of the calcaneus. No overlying skin changes of the ankle. No pain at medial lateral malleoli nor base of the fifth metatarsal  Mental Status: No depression, anxiety, nor  agitation. Skin: Warm and dry.  Assessment & Plan: Selena May was seen today for left leg pain and right foot cramp and give way.  Diagnoses and all orders for this visit:  Muscle cramp  Right ankle pain Orders: -     DG Ankle Complete Right; Future  Other orders -     Cancel: Tdap vaccine greater than or equal to 7yo IM   Muscle cramping: Discussed starting over-the-counter magnesium supplementation at 400 mg daily and staying well hydrated. Ankle pain: Obtain x-ray to rule out stress fracture to get further clarification on the hardware she had placed 12 years ago. Ultimate plan will be determined based on the above results   Return if symptoms worsen or fail to improve.

## 2014-08-11 ENCOUNTER — Ambulatory Visit: Payer: Managed Care, Other (non HMO)

## 2014-10-22 ENCOUNTER — Encounter: Payer: Self-pay | Admitting: Family Medicine

## 2014-10-22 ENCOUNTER — Ambulatory Visit (INDEPENDENT_AMBULATORY_CARE_PROVIDER_SITE_OTHER): Payer: Managed Care, Other (non HMO) | Admitting: Family Medicine

## 2014-10-22 VITALS — BP 172/85 | HR 72 | Temp 97.6°F | Wt 218.0 lb

## 2014-10-22 DIAGNOSIS — R079 Chest pain, unspecified: Secondary | ICD-10-CM | POA: Diagnosis not present

## 2014-10-22 DIAGNOSIS — N3001 Acute cystitis with hematuria: Secondary | ICD-10-CM

## 2014-10-22 DIAGNOSIS — N390 Urinary tract infection, site not specified: Secondary | ICD-10-CM | POA: Insufficient documentation

## 2014-10-22 LAB — POCT URINALYSIS DIPSTICK
Bilirubin, UA: NEGATIVE
Glucose, UA: NEGATIVE
KETONES UA: NEGATIVE
Nitrite, UA: NEGATIVE
Protein, UA: 100
UROBILINOGEN UA: 0.2
pH, UA: 6

## 2014-10-22 MED ORDER — CEPHALEXIN 500 MG PO CAPS: 500.0000 mg | ORAL_CAPSULE | Freq: Two times a day (BID) | ORAL | Status: DC

## 2014-10-22 NOTE — Progress Notes (Signed)
Selena May is a 64 y.o. female who presents to [facility]  today for UTI and also chest pain. 1)  patient has a several-day history of mild pelvis discomfort urinary frequency urgency and dysuria. This is associated with mild low back pain. Symptoms are consistent with previous episodes of urinary tract infection. No fevers or chills and vomiting or diarrhea. She took some leftover antibiotics which did not help.   2) chest pain. Patient has a several-day history of chest pain. She notes moderate central chest pain radiating to the shoulders and neck. The pain is worse when she lies flat at night. She has to sleep propped up because of shortness of breath. The pain is associated with shortness of breath and palpitations. The pain and shortness of breath are both exertional and nature. She denies any cough or fevers or chills.    Past Medical History  Diagnosis Date  . 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 VUR (vesicoureteric reflux) 11/30/2011  . Iron deficiency anemia 12/15/2011  . 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 Leukocytosis 12/15/2011   Past Surgical History  Procedure Laterality Date  . Cholecystectomy    . Abdominal hysterectomy    . Appendectomy    . Tonsillectomy    . Gastric bypass    . Knee surgery      left x 2 right x 1  . Kidney surgery     History  Substance Use Topics  . Smoking status: Never Smoker   . Smokeless tobacco: Never Used  . Alcohol Use: No   ROS as above Medications: Current Outpatient Prescriptions  Medication Sig Dispense Refill  . calcium carbonate (TUMS - DOSED IN MG ELEMENTAL CALCIUM) 500 MG chewable tablet Chew 1 tablet by mouth daily.    . Cholecalciferol (VITAMIN D3) 2000 UNITS TABS Take by mouth.    . Cyanocobalamin (VITAMIN B-12 ER PO) Take by mouth.    . IRON PO Take by mouth.    . Magnesium 400 MG TABS Take by mouth.    . meloxicam (MOBIC) 15 MG tablet Take 15 mg by mouth daily.    . minocycline  (MINOCIN) 100 MG capsule Take 100 mg by mouth 2 (two) times daily.    . Misc Natural Products (LUTEIN 20 PO) Take by mouth.    . Multiple Vitamin (MULTIVITAMIN) tablet Take 1 tablet by mouth daily.    . sertraline (ZOLOFT) 50 MG tablet Take 50 mg by mouth daily.    . ferrous sulfate 325 (65 FE) MG tablet Take 1 tablet (325 mg total) by mouth 2 (two) times daily. 30 tablet 3   No current facility-administered medications for this visit.   Allergies  Allergen Reactions  . Ciprofloxacin   . Sulfa Antibiotics     seizures     Exam:  BP 172/85 mmHg  Pulse 72  Temp(Src) 97.6 F (36.4 C) (Oral)  Wt 218 lb (98.884 kg) Gen: Well NAD HEENT: EOMI,  MMM Lungs: Normal work of breathing. CTABL Heart: RRR no MRG Abd: NABS, Soft. Nondistended, Nontender Exts: Brisk capillary refill, warm and well perfused.  no edema   Twelve-lead EKG: Heart rate 64 bpm normal sinus rhythm. No ST segment elevation or depression. Normal intervals. Normal EKG  Point-of-care urinalysis: Moderate blood negative nitrates small leukocyte esterase  No results found for this or any previous visit (from the past 24 hour(s)). No results found.   Please see individual assessment and plan sections.

## 2014-10-22 NOTE — Assessment & Plan Note (Signed)
Unclear etiology of chest pain however the symptoms are concerning sounding. She has central radiating exertional chest pain with shortness of breath and palpitations. EKG at this time is normal. Her son will drive her to the emergency room for evaluation and management.

## 2014-10-22 NOTE — Assessment & Plan Note (Signed)
Likely urinary tract infection. Culture pending. We'll call in Keflex. F/u after ED PRN.

## 2014-10-25 NOTE — Progress Notes (Signed)
Quick Note:  Normal, no changes. ______ 

## 2014-10-26 LAB — URINE CULTURE

## 2014-10-27 ENCOUNTER — Telehealth: Payer: Self-pay | Admitting: Family Medicine

## 2014-10-27 MED ORDER — CEFDINIR 300 MG PO CAPS: 300.0000 mg | ORAL_CAPSULE | Freq: Two times a day (BID) | ORAL | Status: DC

## 2014-10-27 NOTE — Telephone Encounter (Signed)
Left detailed VM with results and recommendations. Requested a call back with any questions.

## 2014-10-27 NOTE — Telephone Encounter (Signed)
Urine culture shows Klebsiella resistant to the antibiotic I picked. Will switch to omnicef. Omnicef sent in. CMA will contact the patient.

## 2014-12-10 ENCOUNTER — Other Ambulatory Visit: Payer: Self-pay | Admitting: Family Medicine

## 2014-12-14 ENCOUNTER — Ambulatory Visit (INDEPENDENT_AMBULATORY_CARE_PROVIDER_SITE_OTHER): Payer: Managed Care, Other (non HMO) | Admitting: Family Medicine

## 2014-12-14 ENCOUNTER — Encounter: Payer: Self-pay | Admitting: Family Medicine

## 2014-12-14 VITALS — BP 142/68 | HR 69 | Temp 98.2°F | Wt 212.0 lb

## 2014-12-14 DIAGNOSIS — R300 Dysuria: Secondary | ICD-10-CM | POA: Diagnosis not present

## 2014-12-14 DIAGNOSIS — E669 Obesity, unspecified: Secondary | ICD-10-CM | POA: Diagnosis not present

## 2014-12-14 LAB — POCT URINALYSIS DIPSTICK
BILIRUBIN UA: NEGATIVE
GLUCOSE UA: NEGATIVE
KETONES UA: NEGATIVE
Nitrite, UA: NEGATIVE
Spec Grav, UA: 1.02
Urobilinogen, UA: 0.2
pH, UA: 5.5

## 2014-12-14 MED ORDER — PHENTERMINE HCL 37.5 MG PO TABS: 37.5000 mg | ORAL_TABLET | Freq: Every day | ORAL | Status: DC

## 2014-12-14 MED ORDER — CEFPODOXIME PROXETIL 200 MG PO TABS: 200.0000 mg | ORAL_TABLET | Freq: Two times a day (BID) | ORAL | Status: DC

## 2014-12-14 NOTE — Progress Notes (Signed)
CC: Selena May is a 64 y.o. female is here for Urinary Tract Infection   Subjective: HPI:  3 days of dysuria and urinary frequency. Symptoms are moderate in severity and slowly worsening on daily basis. They occur both during the day and also wakes her up at night. She's noticing her urine is a little bit more cloudy. She denies any blood in her urine. She denies nausea, vomiting, back pain, flank pain, abdominal pain nor any gastrointestinal complaints. No interventions as of yet  Over the past month she's lost 5 pounds intentionally by trying to focus on portion control. She feels like she's plateaued and her biggest obstacle is trying to keep portions to a minimum. She would like to know if there is a medicine that she can take to help as an appetite suppressant. She is active on the weekends and tries to find time during the weekdays to stay active.   Review Of Systems Outlined In HPI  Past Medical History  Diagnosis Date  . 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 VUR (vesicoureteric reflux) 11/30/2011  . Iron deficiency anemia 12/15/2011  . 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 Leukocytosis 12/15/2011    Past Surgical History  Procedure Laterality Date  . Cholecystectomy    . Abdominal hysterectomy    . Appendectomy    . Tonsillectomy    . Gastric bypass    . Knee surgery      left x 2 right x 1  . Kidney surgery     Family History  Problem Relation Age of Onset  . Alzheimer's disease Mother   . COPD Mother   . Hypertension Mother   . Heart attack Mother   . Cancer Father     Lung  . Heart attack Sister   . Diabetes Sister   . Heart failure Sister   . Heart attack Brother   . Cancer Brother     Lung    Social History   Social History  . Marital Status: Widowed    Spouse Name: N/A  . Number of Children: N/A  . Years of Education: N/A   Occupational History  . Not on file.   Social History Main Topics  . Smoking status: Never Smoker   . Smokeless tobacco:  Never Used  . Alcohol Use: No  . Drug Use: No  . Sexual Activity: No   Other Topics Concern  . Not on file   Social History Narrative     Objective: BP 142/68 mmHg  Pulse 69  Temp(Src) 98.2 F (36.8 C) (Oral)  Wt 212 lb (96.163 kg)  Vital signs reviewed. General: Alert and Oriented, No Acute Distress HEENT: Pupils equal, round, reactive to light. Conjunctivae clear.  External ears unremarkable.  Moist mucous membranes. Lungs: Clear and comfortable work of breathing, speaking in full sentences without accessory muscle use. Cardiac: Regular rate and rhythm.  Neuro: CN II-XII grossly intact, gait normal. Extremities: No peripheral edema.  Strong peripheral pulses.  Mental Status: No depression, anxiety, nor agitation. Logical though process. Skin: Warm and dry.  Assessment & Plan: Selena May was seen today for urinary tract infection.  Diagnoses and all orders for this visit:  Dysuria -     Urinalysis Dipstick -     Urine Culture -     cefpodoxime (VANTIN) 200 MG tablet; Take 1 tablet (200 mg total) by mouth 2 (two) times daily.  Obesity -     phentermine (ADIPEX-P) 37.5 MG tablet; Take 1 tablet (  37.5 mg total) by mouth daily before breakfast.   Dysuria: Urinalysis suspicious for UTI therefore start Vantin based on prior sensitivities and her allergies. Obesity: Discussed benefits of exercising on a daily basis and that phentermine as an option for appetite suppression but it will become ineffective after a few months and she should be prepared for this.  Return in about 4 weeks (around 01/11/2015) for BP Weight Check.

## 2014-12-16 LAB — URINE CULTURE: Colony Count: 9000

## 2015-01-11 ENCOUNTER — Ambulatory Visit (INDEPENDENT_AMBULATORY_CARE_PROVIDER_SITE_OTHER): Payer: Managed Care, Other (non HMO) | Admitting: Family Medicine

## 2015-01-11 ENCOUNTER — Encounter: Payer: Self-pay | Admitting: Family Medicine

## 2015-01-11 VITALS — BP 125/78 | HR 100 | Resp 16 | Wt 200.0 lb

## 2015-01-11 DIAGNOSIS — N3001 Acute cystitis with hematuria: Secondary | ICD-10-CM | POA: Diagnosis not present

## 2015-01-11 DIAGNOSIS — N760 Acute vaginitis: Secondary | ICD-10-CM | POA: Diagnosis not present

## 2015-01-11 DIAGNOSIS — E669 Obesity, unspecified: Secondary | ICD-10-CM | POA: Diagnosis not present

## 2015-01-11 LAB — POCT URINALYSIS DIPSTICK
BILIRUBIN UA: NEGATIVE
GLUCOSE UA: NEGATIVE
Nitrite, UA: POSITIVE
Protein, UA: 100
Spec Grav, UA: 1.025
Urobilinogen, UA: 0.2
pH, UA: 5.5

## 2015-01-11 MED ORDER — FLUCONAZOLE 150 MG PO TABS: 150.0000 mg | ORAL_TABLET | Freq: Once | ORAL | Status: DC

## 2015-01-11 MED ORDER — PHENTERMINE HCL 37.5 MG PO TABS
37.5000 mg | ORAL_TABLET | Freq: Every day | ORAL | Status: DC
Start: 2015-01-11 — End: 2015-02-08

## 2015-01-11 MED ORDER — NITROFURANTOIN MONOHYD MACRO 100 MG PO CAPS: 100.0000 mg | ORAL_CAPSULE | Freq: Two times a day (BID) | ORAL | Status: DC

## 2015-01-11 NOTE — Assessment & Plan Note (Signed)
Wet prep Pending.  Treatment with fluconazole

## 2015-01-11 NOTE — Patient Instructions (Signed)
Thank you for coming in today. Call or go to the emergency room if you get worse, have trouble breathing, have chest pains, or palpitations.  If your belly pain worsens, or you have high fever, bad vomiting, blood in your stool or black tarry stool go to the Emergency Room.  Take the macrobid and fluconazole.   Follow up with Dr. Lemmie Evens in 1 month.

## 2015-01-11 NOTE — Assessment & Plan Note (Signed)
Culture pending. Empiric treatment with Macrobid

## 2015-01-11 NOTE — Progress Notes (Signed)
Selena May is a 64 y.o. female who presents to [facility]  today for weight check and dysuria.  She was originally scheduled for a nurse visit weight check. However she noted continued dysuria and vaginal itching. She was seen by her PCP about a month ago for the same problem and prescribed Cefpodoxime. This helped temporarily however symptoms returned. She notes vaginal itching as well as pain with urination and urinary frequency and urgency.  Additionally patient notes that she is doing very well with phentermine and losing weight. No chest pains or palpitations or shortness of breath.   Past Medical History  Diagnosis Date  . 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 VUR (vesicoureteric reflux) 11/30/2011  . Iron deficiency anemia 12/15/2011  . 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 Leukocytosis 12/15/2011   Past Surgical History  Procedure Laterality Date  . Cholecystectomy    . Abdominal hysterectomy    . Appendectomy    . Tonsillectomy    . Gastric bypass    . Knee surgery      left x 2 right x 1  . Kidney surgery     Social History  Substance Use Topics  . Smoking status: Never Smoker   . Smokeless tobacco: Never Used  . Alcohol Use: No   family history includes Alzheimer's disease in her mother; COPD in her mother; Cancer in her brother and father; Diabetes in her sister; Heart attack in her brother, mother, and sister; Heart failure in her sister; Hypertension in her mother.  ROS as above Medications: Current Outpatient Prescriptions  Medication Sig Dispense Refill  . calcium carbonate (TUMS - DOSED IN MG ELEMENTAL CALCIUM) 500 MG chewable tablet Chew 1 tablet by mouth daily.    . cefpodoxime (VANTIN) 200 MG tablet Take 1 tablet (200 mg total) by mouth 2 (two) times daily. 14 tablet 0  . Cholecalciferol (VITAMIN D3) 2000 UNITS TABS Take by mouth.    . Cyanocobalamin (VITAMIN B-12 ER PO) Take by mouth.    . IRON PO Take by mouth.     . Magnesium 400 MG TABS Take by mouth.    . meloxicam (MOBIC) 15 MG tablet Take 15 mg by mouth daily.    . Misc Natural Products (LUTEIN 20 PO) Take by mouth.    . Multiple Vitamin (MULTIVITAMIN) tablet Take 1 tablet by mouth daily.    . phentermine (ADIPEX-P) 37.5 MG tablet Take 1 tablet (37.5 mg total) by mouth daily before breakfast. 30 tablet 0  . sertraline (ZOLOFT) 50 MG tablet Take 50 mg by mouth daily.    . ferrous sulfate 325 (65 FE) MG tablet Take 1 tablet (325 mg total) by mouth 2 (two) times daily. 30 tablet 3  . fluconazole (DIFLUCAN) 150 MG tablet Take 1 tablet (150 mg total) by mouth once. 1 tablet 1  . nitrofurantoin, macrocrystal-monohydrate, (MACROBID) 100 MG capsule Take 1 capsule (100 mg total) by mouth 2 (two) times daily. 14 capsule 0   No current facility-administered medications for this visit.   Allergies  Allergen Reactions  . Ciprofloxacin   . Sulfa Antibiotics     seizures     Exam:  BP 125/78 mmHg  Pulse 100  Resp 16  Wt 200 lb (90.719 kg) Gen: Well NAD HEENT: EOMI,  MMM Lungs: Normal work of breathing. CTABL Heart: RRR no MRG Abd: NABS, Soft. Nondistended, Nontender Exts: Brisk capillary refill, warm and well perfused.  Genitals: Normal external vaginal exam with  no lesions. Small amount of whitish discharge present at the introitus.  Blind swab for wet prep obtained  Results for orders placed or performed in visit on 01/11/15 (from the past 24 hour(s))  POCT Urinalysis Dipstick     Status: Abnormal   Collection Time: 01/11/15  2:10 PM  Result Value Ref Range   Color, UA yellow    Clarity, UA turbid    Glucose, UA neg    Bilirubin, UA neg    Ketones, UA trace    Spec Grav, UA 1.025    Blood, UA mod    pH, UA 5.5    Protein, UA 100    Urobilinogen, UA 0.2    Nitrite, UA pos    Leukocytes, UA large (3+) (A) Negative   No results found.   Please see individual assessment and plan sections.

## 2015-01-12 LAB — WET PREP, GENITAL
TRICH WET PREP: NONE SEEN
YEAST WET PREP: NONE SEEN

## 2015-01-12 MED ORDER — METRONIDAZOLE 500 MG PO TABS: 500.0000 mg | ORAL_TABLET | Freq: Two times a day (BID) | ORAL | Status: DC

## 2015-01-12 NOTE — Progress Notes (Signed)
Quick Note:  Wet prep shows BV. Treat with flagyl. Urine culture pending. Continue other antibiotics. ______

## 2015-01-12 NOTE — Addendum Note (Signed)
Addended by: Gregor Hams on: 01/12/2015 03:48 PM   Modules accepted: Orders, SmartSet

## 2015-01-14 LAB — URINE CULTURE

## 2015-01-15 NOTE — Progress Notes (Signed)
Quick Note:  Labs show the bacteria in the urine is Klebsiella. This is resistant to many antibiotics. The one I picked is intermediate resistant. If feeling better no treatment. If not better let us know and we will call in Augmentin which should work. ______

## 2015-01-20 ENCOUNTER — Other Ambulatory Visit: Payer: Self-pay | Admitting: Family Medicine

## 2015-01-20 NOTE — Telephone Encounter (Signed)
Refill is ok but can you ask her to follow up before December?

## 2015-01-20 NOTE — Telephone Encounter (Signed)
Patient advised.

## 2015-01-20 NOTE — Telephone Encounter (Signed)
Rx is being requested.  Pt does not have a f/u visit.

## 2015-01-20 NOTE — Addendum Note (Signed)
Addended by: Marcial Pacas on: 01/20/2015 01:08 PM   Modules accepted: Orders, Medications

## 2015-02-08 ENCOUNTER — Ambulatory Visit: Payer: Managed Care, Other (non HMO)

## 2015-02-08 ENCOUNTER — Ambulatory Visit (INDEPENDENT_AMBULATORY_CARE_PROVIDER_SITE_OTHER): Payer: Managed Care, Other (non HMO) | Admitting: Family Medicine

## 2015-02-08 ENCOUNTER — Encounter: Payer: Self-pay | Admitting: Family Medicine

## 2015-02-08 VITALS — BP 137/80 | HR 85 | Wt 198.0 lb

## 2015-02-08 DIAGNOSIS — H6982 Other specified disorders of Eustachian tube, left ear: Secondary | ICD-10-CM

## 2015-02-08 DIAGNOSIS — E669 Obesity, unspecified: Secondary | ICD-10-CM

## 2015-02-08 DIAGNOSIS — H6992 Unspecified Eustachian tube disorder, left ear: Secondary | ICD-10-CM | POA: Insufficient documentation

## 2015-02-08 MED ORDER — PHENTERMINE HCL 37.5 MG PO TABS: 37.5000 mg | ORAL_TABLET | Freq: Every day | ORAL | Status: DC

## 2015-02-08 NOTE — Progress Notes (Signed)
CC: Selena May is a 64 y.o. female is here for Medication Management   Subjective: HPI:  Follow-up obesity: No formal exercise routine, she's cut out the majority of starches in simple sugars in her diet. She is now eating a high-protein diet but has reduced portions overall ever since started on phentermine. She believes it's doing a good job with helping reduce her appetite. She denies any side effects. Denies anxiety or sleep disturbance.   Complains of left ear pain and occasional hearing loss. It comes and goes on a daily basis. It improved if she puts her hand up against her ear. Occasionally it'll make a popping sound and all of a sudden her symptoms are absent. Symptoms are mild in severity when present. She denies nasal congestion, ear drainage, headache, fevers, chills or sore throat   Review Of Systems Outlined In HPI  Past Medical History  Diagnosis Date  . 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 VUR (vesicoureteric reflux) 11/30/2011  . Iron deficiency anemia 12/15/2011  . 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 Leukocytosis 12/15/2011    Past Surgical History  Procedure Laterality Date  . Cholecystectomy    . Abdominal hysterectomy    . Appendectomy    . Tonsillectomy    . Gastric bypass    . Knee surgery      left x 2 right x 1  . Kidney surgery     Family History  Problem Relation Age of Onset  . Alzheimer's disease Mother   . COPD Mother   . Hypertension Mother   . Heart attack Mother   . Cancer Father     Lung  . Heart attack Sister   . Diabetes Sister   . Heart failure Sister   . Heart attack Brother   . Cancer Brother     Lung    Social History   Social History  . Marital Status: Widowed    Spouse Name: N/A  . Number of Children: N/A  . Years of Education: N/A   Occupational History  . Not on file.   Social History Main Topics  . Smoking status: Never Smoker   . Smokeless tobacco: Never Used  . Alcohol Use: No  . Drug Use: No  . Sexual  Activity: No   Other Topics Concern  . Not on file   Social History Narrative     Objective: BP 137/80 mmHg  Pulse 85  Wt 198 lb (89.812 kg)  General: Alert and Oriented, No Acute Distress HEENT: Pupils equal, round, reactive to light. Conjunctivae clear.  External ears unremarkable, canals clear with intact TMs with appropriate landmarks.  Middle ear appears open without effusion. Pink inferior turbinates.  Moist mucous membranes, pharynx without inflammation nor lesions.  Neck supple without palpable lymphadenopathy nor abnormal masses. Lungs: clear and comfortable work of breathing Cardiac: Regular rate and rhythm.  Extremities: No peripheral edema.  Strong peripheral pulses.  Mental Status: No depression, anxiety, nor agitation. Skin: Warm and dry.  Assessment & Plan: Lakeysha was seen today for medication management.  Diagnoses and all orders for this visit:  Obesity -     phentermine (ADIPEX-P) 37.5 MG tablet; Take 1 tablet (37.5 mg total) by mouth daily before breakfast.  Dysfunction of left Eustachian tube   Obesity: Improving, refilled phentermine, prepared her that this medication may lose its effectiveness in the next couple months. Eustachian tube dysfunction: Start Flonase or Nasacort which ever one is cheaper if this does not help with  hearing loss after 4 days call me so I can set up an audiology evaluation  Return in about 4 weeks (around 03/08/2015) for weight check.

## 2015-02-17 ENCOUNTER — Ambulatory Visit: Payer: Managed Care, Other (non HMO) | Admitting: Family Medicine

## 2015-03-09 ENCOUNTER — Ambulatory Visit (INDEPENDENT_AMBULATORY_CARE_PROVIDER_SITE_OTHER): Payer: Managed Care, Other (non HMO) | Admitting: Family Medicine

## 2015-03-09 ENCOUNTER — Encounter: Payer: Self-pay | Admitting: Family Medicine

## 2015-03-09 VITALS — BP 135/75 | HR 82 | Temp 97.9°F | Wt 198.0 lb

## 2015-03-09 DIAGNOSIS — R300 Dysuria: Secondary | ICD-10-CM | POA: Diagnosis not present

## 2015-03-09 LAB — POCT URINALYSIS DIPSTICK
BILIRUBIN UA: NEGATIVE
Glucose, UA: NEGATIVE
KETONES UA: NEGATIVE
Nitrite, UA: NEGATIVE
PH UA: 5.5
PROTEIN UA: 30
SPEC GRAV UA: 1.025
Urobilinogen, UA: 0.2

## 2015-03-09 MED ORDER — CEPHALEXIN 250 MG PO CAPS: 250.0000 mg | ORAL_CAPSULE | Freq: Every day | ORAL | Status: DC

## 2015-03-09 MED ORDER — CEPHALEXIN 500 MG PO CAPS: 500.0000 mg | ORAL_CAPSULE | Freq: Three times a day (TID) | ORAL | Status: DC

## 2015-03-09 NOTE — Progress Notes (Signed)
CC: Selena May is a 64 y.o. female is here for Urinary Tract Infection   Subjective: HPI:  Urinary frequency, dysuria and urgency that's been going on for the last 2 or 3 days. It's worsening on daily basis. Nothing seems to make it better or worse. She had some chills yesterday but denies fevers. She denies flank pain, nausea or confusion. Symptoms were absent after taking Macrobid for a few days last month. Symptoms are moderate in severity on a daily basis   Review Of Systems Outlined In HPI  Past Medical History  Diagnosis Date  . 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 VUR (vesicoureteric reflux) 11/30/2011  . Iron deficiency anemia 12/15/2011  . 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 Leukocytosis 12/15/2011    Past Surgical History  Procedure Laterality Date  . Cholecystectomy    . Abdominal hysterectomy    . Appendectomy    . Tonsillectomy    . Gastric bypass    . Knee surgery      left x 2 right x 1  . Kidney surgery     Family History  Problem Relation Age of Onset  . Alzheimer's disease Mother   . COPD Mother   . Hypertension Mother   . Heart attack Mother   . Cancer Father     Lung  . Heart attack Sister   . Diabetes Sister   . Heart failure Sister   . Heart attack Brother   . Cancer Brother     Lung    Social History   Social History  . Marital Status: Widowed    Spouse Name: N/A  . Number of Children: N/A  . Years of Education: N/A   Occupational History  . Not on file.   Social History Main Topics  . Smoking status: Never Smoker   . Smokeless tobacco: Never Used  . Alcohol Use: No  . Drug Use: No  . Sexual Activity: No   Other Topics Concern  . Not on file   Social History Narrative     Objective: BP 135/75 mmHg  Pulse 82  Temp(Src) 97.9 F (36.6 C) (Oral)  Wt 198 lb (89.812 kg)  Vital signs reviewed. General: Alert and Oriented, No Acute Distress HEENT: Pupils equal, round, reactive to light. Conjunctivae clear.  External ears  unremarkable.  Moist mucous membranes. Lungs: Clear and comfortable work of breathing, speaking in full sentences without accessory muscle use. Cardiac: Regular rate and rhythm.  Neuro: CN II-XII grossly intact, gait normal. Extremities: No peripheral edema.  Strong peripheral pulses.  Mental Status: No depression, anxiety, nor agitation. Logical though process. Skin: Warm and dry. No CVA tenderness  Assessment & Plan: Selena May was seen today for urinary tract infection.  Diagnoses and all orders for this visit:  Dysuria -     Urine culture -     POCT Urinalysis Dipstick -     cephALEXin (KEFLEX) 500 MG capsule; Take 1 capsule (500 mg total) by mouth 3 (three) times daily. -     cephALEXin (KEFLEX) 250 MG capsule; Take 1 capsule (250 mg total) by mouth daily. To prevent bladder infections   Urinalysis suspicious for UTI therefore start Keflex and if symptoms subside plan on taking a daily prophylactic dose of 250 mg daily. We'll follow culture to make sure this is a appropriate regimen.  Return if symptoms worsen or fail to improve.

## 2015-03-11 LAB — URINE CULTURE
COLONY COUNT: NO GROWTH
Organism ID, Bacteria: NO GROWTH

## 2015-03-18 ENCOUNTER — Encounter: Payer: Self-pay | Admitting: Family Medicine

## 2015-03-18 ENCOUNTER — Ambulatory Visit (INDEPENDENT_AMBULATORY_CARE_PROVIDER_SITE_OTHER): Payer: Managed Care, Other (non HMO) | Admitting: Family Medicine

## 2015-03-18 ENCOUNTER — Other Ambulatory Visit: Payer: Self-pay | Admitting: Family Medicine

## 2015-03-18 VITALS — BP 145/72 | HR 77 | Temp 97.3°F

## 2015-03-18 DIAGNOSIS — N760 Acute vaginitis: Secondary | ICD-10-CM | POA: Diagnosis not present

## 2015-03-18 DIAGNOSIS — R300 Dysuria: Secondary | ICD-10-CM | POA: Diagnosis not present

## 2015-03-18 DIAGNOSIS — N3001 Acute cystitis with hematuria: Secondary | ICD-10-CM | POA: Diagnosis not present

## 2015-03-18 LAB — POCT URINALYSIS DIPSTICK
BILIRUBIN UA: NEGATIVE
GLUCOSE UA: NEGATIVE
Ketones, UA: NEGATIVE
Nitrite, UA: NEGATIVE
SPEC GRAV UA: 1.025
Urobilinogen, UA: 0.2
pH, UA: 5.5

## 2015-03-18 LAB — WET PREP, GENITAL
Clue Cells Wet Prep HPF POC: NONE SEEN
Trich, Wet Prep: NONE SEEN
Yeast Wet Prep HPF POC: NONE SEEN

## 2015-03-18 MED ORDER — FLUCONAZOLE 150 MG PO TABS: 150.0000 mg | ORAL_TABLET | Freq: Once | ORAL | Status: DC

## 2015-03-18 MED ORDER — NITROFURANTOIN MONOHYD MACRO 100 MG PO CAPS: 100.0000 mg | ORAL_CAPSULE | Freq: Two times a day (BID) | ORAL | Status: DC

## 2015-03-18 MED ORDER — METRONIDAZOLE 500 MG PO TABS: 500.0000 mg | ORAL_TABLET | Freq: Two times a day (BID) | ORAL | Status: DC

## 2015-03-18 NOTE — Assessment & Plan Note (Addendum)
Acute. Clue cells found on wet prep two months ago. Repeat wet prep. Will treat with metronidazole Given time course of itching, could be due to candida. Will prescribe fluconazole. Could also be due to vulvar pathology like lichen sclerosis or lichen planus. Patient will return for [place] to perform pelvic exam with persistence of symptoms in 1 week.

## 2015-03-18 NOTE — Patient Instructions (Signed)
Thank you for coming in today. You were seen today for persistent UTI symptoms and itching. We think this is likely due to a yeast infection and inadequate treatment of the UTI.  We will prescribe a different antibiotic as well as an antifungal.  You also were found to have a vaginal infection called bacterial vaginosis. We will treat this with Flagyl (metronidazole)  Please ask your primary care doctor about topical estrogen for the recurrent UTIs.  Return in 1 week for persistent or worsening symptoms.

## 2015-03-18 NOTE — Progress Notes (Signed)
Selena May is a 63 y.o. female who presents to [place]: Primary Care today for persistent UTI, vaginal itching.  Patient presented with dysuria and frequency 10 days ago, given Keflex 500 tid. One week ago, she had itching that has progressively worsened. This has scratched it to the point of excoriation. She continues to note dysuria, 12x daily frequency and urgency, though this improved somewhat with the Keflex. She denies odor, discharge and notes her vagina is dry.    Of note, she was found 2 months ago to have clue cells in her wet prep and has not taken metronidazole.    Past Medical History  Diagnosis Date  . 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 VUR (vesicoureteric reflux) 11/30/2011  . Iron deficiency anemia 12/15/2011  . 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 Leukocytosis 12/15/2011   Past Surgical History  Procedure Laterality Date  . Cholecystectomy    . Abdominal hysterectomy    . Appendectomy    . Tonsillectomy    . Gastric bypass    . Knee surgery      left x 2 right x 1  . Kidney surgery     Social History  Substance Use Topics  . Smoking status: Never Smoker   . Smokeless tobacco: Never Used  . Alcohol Use: No   family history includes Alzheimer's disease in her mother; COPD in her mother; Cancer in her brother and father; Diabetes in her sister; Heart attack in her brother, mother, and sister; Heart failure in her sister; Hypertension in her mother.  ROS as above Medications: Current Outpatient Prescriptions  Medication Sig Dispense Refill  . calcium carbonate (TUMS - DOSED IN MG ELEMENTAL CALCIUM) 500 MG chewable tablet Chew 1 tablet by mouth daily.    . cephALEXin (KEFLEX) 500 MG capsule Take 1 capsule (500 mg total) by mouth 3 (three) times daily. 30 capsule 0  . Cholecalciferol (VITAMIN D3) 2000 UNITS TABS Take by mouth.    . Cyanocobalamin (VITAMIN B-12 ER  PO) Take by mouth.    . IRON PO Take by mouth.    . Magnesium 400 MG TABS Take by mouth.    . Misc Natural Products (LUTEIN 20 PO) Take by mouth.    . Multiple Vitamin (MULTIVITAMIN) tablet Take 1 tablet by mouth daily.    . phentermine (ADIPEX-P) 37.5 MG tablet Take 1 tablet (37.5 mg total) by mouth daily before breakfast. 30 tablet 0  . sertraline (ZOLOFT) 50 MG tablet TAKE 1 TABLET BY MOUTH DAILY 90 tablet 0  . cephALEXin (KEFLEX) 250 MG capsule Take 1 capsule (250 mg total) by mouth daily. To prevent bladder infections (Patient not taking: Reported on 03/18/2015) 90 capsule 3  . ferrous sulfate 325 (65 FE) MG tablet Take 1 tablet (325 mg total) by mouth 2 (two) times daily. 30 tablet 3  . fluconazole (DIFLUCAN) 150 MG tablet Take 1 tablet (150 mg total) by mouth once. 1 tablet 0  . meloxicam (MOBIC) 15 MG tablet Take 15 mg by mouth daily. Reported on 03/18/2015    . metroNIDAZOLE (FLAGYL) 500 MG tablet Take 1 tablet (500 mg total) by mouth 2 (two) times daily. 14 tablet 0  . minocycline (MINOCIN,DYNACIN) 100 MG capsule Take 100 mg by mouth 2 (two) times daily. Reported on 03/18/2015    . nitrofurantoin, macrocrystal-monohydrate, (MACROBID) 100 MG capsule Take 1 capsule (100 mg total) by mouth 2 (two) times daily. 14 capsule 0  No current facility-administered medications for this visit.   Allergies  Allergen Reactions  . Ciprofloxacin   . Sulfa Antibiotics     seizures     Exam:  BP 145/72 mmHg  Pulse 77  Temp(Src) 97.3 F (36.3 C) (Oral) Gen: Well NAD HEENT: EOMI,  MMM Lungs: Normal work of breathing. CTABL Heart: RRR no MRG Abd: NABS, Soft. Nondistended, Nontender. No CVA tenderness.  Exts: Brisk capillary refill, warm and well perfused.  GU: Pelvic exam deferred by patient   Results for orders placed or performed in visit on 03/18/15 (from the past 24 hour(s))  POCT Urinalysis Dipstick     Status: Abnormal   Collection Time: 03/18/15  2:35 PM  Result Value Ref Range     Color, UA yellow    Clarity, UA clear    Glucose, UA neg    Bilirubin, UA neg    Ketones, UA neg    Spec Grav, UA 1.025    Blood, UA small    pH, UA 5.5    Protein, UA trace    Urobilinogen, UA 0.2    Nitrite, UA neg    Leukocytes, UA small (1+) (A) Negative   No results found.   Please see individual assessment and plan sections.

## 2015-03-18 NOTE — Assessment & Plan Note (Signed)
Persistent in context of recurrent UTIs due to VUR. Given improvement with Keflex, could be inadequately treated. Will start [place]. Return to care with persistent symptoms.

## 2015-03-18 NOTE — Addendum Note (Signed)
Addended by: Gregor Hams on: 03/18/2015 11:01 PM   Modules accepted: Miquel Dunn

## 2015-03-18 NOTE — Progress Notes (Signed)
Quick Note:  Wet prep did not show BV or yeast ______

## 2015-03-21 LAB — URINE CULTURE: Colony Count: >=100000

## 2015-03-22 ENCOUNTER — Other Ambulatory Visit: Payer: Self-pay | Admitting: Family Medicine

## 2015-03-22 NOTE — Progress Notes (Signed)
Quick Note:  Urine culture shows Ecoli sensitive to the antibiotic we picked. ______

## 2015-04-07 ENCOUNTER — Ambulatory Visit (INDEPENDENT_AMBULATORY_CARE_PROVIDER_SITE_OTHER): Payer: Managed Care, Other (non HMO) | Admitting: Family Medicine

## 2015-04-07 ENCOUNTER — Encounter: Payer: Self-pay | Admitting: Family Medicine

## 2015-04-07 VITALS — BP 142/75 | HR 79 | Wt 198.0 lb

## 2015-04-07 DIAGNOSIS — H9202 Otalgia, left ear: Secondary | ICD-10-CM | POA: Diagnosis not present

## 2015-04-07 DIAGNOSIS — E669 Obesity, unspecified: Secondary | ICD-10-CM | POA: Diagnosis not present

## 2015-04-07 MED ORDER — PREDNISONE 20 MG PO TABS: ORAL_TABLET | ORAL | Status: DC

## 2015-04-07 MED ORDER — PHENTERMINE HCL 37.5 MG PO TABS: 37.5000 mg | ORAL_TABLET | Freq: Every day | ORAL | Status: DC

## 2015-04-07 NOTE — Progress Notes (Signed)
CC: Selena May is a 65 y.o. female is here for Left Ear Fullness   Subjective: HPI:  Back in November she began to feel some pain in her left ear with mild hearing loss described as "buzzing".  It was 100% relieved after taking a nasal steroid up until this weekend. Symptoms come and go throughout the day, nothing is helping or worsening it now. Denies any discharge from the ear. No nasal congestion or headaches.  Denies fever, chills, dizziness, nor confusion  Requesting refills on phentermine, ran out of Rx last month and the holidays made it hard to lose weight. No known side effects. No workout reigmen as of now.    Review Of Systems Outlined In HPI  Past Medical History  Diagnosis Date  . 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 VUR (vesicoureteric reflux) 11/30/2011  . Iron deficiency anemia 12/15/2011  . 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 Leukocytosis 12/15/2011    Past Surgical History  Procedure Laterality Date  . Cholecystectomy    . Abdominal hysterectomy    . Appendectomy    . Tonsillectomy    . Gastric bypass    . Knee surgery      left x 2 right x 1  . Kidney surgery     Family History  Problem Relation Age of Onset  . Alzheimer's disease Mother   . COPD Mother   . Hypertension Mother   . Heart attack Mother   . Cancer Father     Lung  . Heart attack Sister   . Diabetes Sister   . Heart failure Sister   . Heart attack Brother   . Cancer Brother     Lung    Social History   Social History  . Marital Status: Widowed    Spouse Name: N/A  . Number of Children: N/A  . Years of Education: N/A   Occupational History  . Not on file.   Social History Main Topics  . Smoking status: Never Smoker   . Smokeless tobacco: Never Used  . Alcohol Use: No  . Drug Use: No  . Sexual Activity: No   Other Topics Concern  . Not on file   Social History Narrative     Objective: BP 142/75 mmHg  Pulse 79  Wt 198 lb (89.812 kg)  General: Alert and  Oriented, No Acute Distress HEENT: Pupils equal, round, reactive to light. Conjunctivae clear.  Moist mucous membranes, trace serous effusion on the left. Lungs: Clear to auscultation bilaterally, no wheezing/ronchi/rales.  Comfortable work of breathing. Good air movement. Cardiac: Regular rate and rhythm. Normal S1/S2.  No murmurs, rubs, nor gallops.   Neuro: Rinne test normal on the right and conductive hearing loss present on the left, sensioneural appears unaffected except for Weber lateralizing to the right (louder) Extremities: No peripheral edema.  Strong peripheral pulses.  Mental Status: No depression, anxiety, nor agitation. Skin: Warm and dry.  Assessment & Plan: Selena May was seen today for left ear fullness.  Diagnoses and all orders for this visit:  Left ear pain -     predniSONE (DELTASONE) 20 MG tablet; Three tabs daily days 1-3, two tabs daily days 4-6, one tab daily days 7-9, half tab daily days 10-13.  Obesity -     phentermine (ADIPEX-P) 37.5 MG tablet; Take 1 tablet (37.5 mg total) by mouth daily before breakfast.   Left ear pain suspicious for was likely due to serous effusion on the left therefore start prednisone taper.  If no better by Monday and asked her to call me so I can get her formal audiology. Holding off on referral for now since symptoms come and go Which would be  less likely due to a neuroma or cholesteatoma  Obesity: Stable, improving when taking phentermine. Encouraged to exercise almost daily.  Return in about 4 weeks (around 05/05/2015).

## 2015-04-13 ENCOUNTER — Encounter: Payer: Self-pay | Admitting: Family Medicine

## 2015-04-13 ENCOUNTER — Ambulatory Visit (INDEPENDENT_AMBULATORY_CARE_PROVIDER_SITE_OTHER): Payer: Managed Care, Other (non HMO) | Admitting: Family Medicine

## 2015-04-13 VITALS — BP 121/75 | HR 87 | Wt 200.0 lb

## 2015-04-13 DIAGNOSIS — H9192 Unspecified hearing loss, left ear: Secondary | ICD-10-CM

## 2015-04-13 MED ORDER — AMOXICILLIN-POT CLAVULANATE 500-125 MG PO TABS: ORAL_TABLET | ORAL | Status: AC

## 2015-04-13 NOTE — Progress Notes (Signed)
CC: Selena May is a 65 y.o. female is here for Ear Fullness and Chest Pain   Subjective: HPI:  Complains of persistent hearing loss in the left ear now constant and no longer intermittent. Nothing seems to make it better or worse. Prednisone has not seemed to help much. No new interventions other than prednisone. She also complains of a dull headache in the forehead with nasal congestion and a cough when lying down. Symptoms are slowly getting worse. Slight improvement with taking Aleve PM. She denies fevers, chills, sore throat, vision loss nor any other motor or sensory disturbances.   Review Of Systems Outlined In HPI  Past Medical History  Diagnosis Date  . 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 VUR (vesicoureteric reflux) 11/30/2011  . Iron deficiency anemia 12/15/2011  . 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 Leukocytosis 12/15/2011    Past Surgical History  Procedure Laterality Date  . Cholecystectomy    . Abdominal hysterectomy    . Appendectomy    . Tonsillectomy    . Gastric bypass    . Knee surgery      left x 2 right x 1  . Kidney surgery     Family History  Problem Relation Age of Onset  . Alzheimer's disease Mother   . COPD Mother   . Hypertension Mother   . Heart attack Mother   . Cancer Father     Lung  . Heart attack Sister   . Diabetes Sister   . Heart failure Sister   . Heart attack Brother   . Cancer Brother     Lung    Social History   Social History  . Marital Status: Widowed    Spouse Name: N/A  . Number of Children: N/A  . Years of Education: N/A   Occupational History  . Not on file.   Social History Main Topics  . Smoking status: Never Smoker   . Smokeless tobacco: Never Used  . Alcohol Use: No  . Drug Use: No  . Sexual Activity: No   Other Topics Concern  . Not on file   Social History Narrative     Objective: BP 121/75 mmHg  Pulse 87  Wt 200 lb (90.719 kg)  SpO2 100%  General: Alert and Oriented, No Acute  Distress HEENT: Pupils equal, round, reactive to light. Conjunctivae clear.  External ears unremarkable, canals clear with intact TMs with appropriate landmarks.  Middle ear appears open without effusion. Pink inferior turbinates.  Moist mucous membranes, pharynx without inflammation nor lesions.  Neck supple without palpable lymphadenopathy nor abnormal masses. Lungs: Clear comfortable work of breathing Cardiac: Regular rate and rhythm.  Extremities: No peripheral edema.  Strong peripheral pulses.  Mental Status: No depression, anxiety, nor agitation. Skin: Warm and dry.  Assessment & Plan: Selena May was seen today for ear fullness and chest pain.  Diagnoses and all orders for this visit:  Hearing loss, left -     amoxicillin-clavulanate (AUGMENTIN) 500-125 MG tablet; Take one by mouth every 8 hours for ten total days. -     Ambulatory referral to Audiology    worsening hearing loss, start Augmentin for possibility of this still being eustachian tube dysfunction, will place urgent audiology referral to see if she's having any sensorineural hearing loss which would require an MRI of the eighth cranial nerve   Return if symptoms worsen or fail to improve.

## 2015-04-16 ENCOUNTER — Telehealth: Payer: Self-pay

## 2015-04-16 NOTE — Telephone Encounter (Signed)
I'd recommend she come to urgent care tonight to be seen to see if she needs to get a chest xray

## 2015-04-16 NOTE — Telephone Encounter (Signed)
Pt advised.

## 2015-04-16 NOTE — Telephone Encounter (Signed)
Pt reports that her ear is a little better but she is still having pain and she is still having chest pain she said it feels like an elephant is sitting on her chest.

## 2015-06-14 ENCOUNTER — Ambulatory Visit (INDEPENDENT_AMBULATORY_CARE_PROVIDER_SITE_OTHER): Payer: Managed Care, Other (non HMO) | Admitting: Family Medicine

## 2015-06-14 ENCOUNTER — Encounter: Payer: Self-pay | Admitting: Family Medicine

## 2015-06-14 VITALS — BP 134/76 | HR 82 | Temp 98.0°F | Wt 204.0 lb

## 2015-06-14 DIAGNOSIS — E669 Obesity, unspecified: Secondary | ICD-10-CM | POA: Diagnosis not present

## 2015-06-14 DIAGNOSIS — N390 Urinary tract infection, site not specified: Secondary | ICD-10-CM

## 2015-06-14 LAB — POCT URINALYSIS DIPSTICK
Bilirubin, UA: NEGATIVE
Glucose, UA: NEGATIVE
KETONES UA: NEGATIVE
NITRITE UA: NEGATIVE
PH UA: 5.5
SPEC GRAV UA: 1.025
Urobilinogen, UA: 0.2

## 2015-06-14 MED ORDER — CEPHALEXIN 500 MG PO CAPS: 500.0000 mg | ORAL_CAPSULE | Freq: Three times a day (TID) | ORAL | Status: DC

## 2015-06-14 MED ORDER — PHENTERMINE HCL 37.5 MG PO TABS: 37.5000 mg | ORAL_TABLET | Freq: Every day | ORAL | Status: DC

## 2015-06-14 NOTE — Progress Notes (Signed)
CC: Selena May is a 65 y.o. female is here for Urinary Tract Infection   Subjective: HPI:  Complaints of malodorous urine with mild dysuria present for the past 2 or 3 days and worsening. Also accompanied by midline back pain described as a cramping sensation. She's tried to hydrate better but no other interventions as of yet. Nothing seems to make symptoms better or worse other than that described above. She's had some nausea since yesterday and threw up once. She denies any nausea today or abdominal pain. Denies any gastrointestinal complaints other than the discomfort yesterday. Denies fevers, chills or confusion  Follow up obesity: She wants know she can restart phentermine. While she was taken she noticed that it helped control her appetite and also helped with weight loss. She stopped taking this a little over a month ago due to forgetfulness.   Review Of Systems Outlined In HPI  Past Medical History  Diagnosis Date  . 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 VUR (vesicoureteric reflux) 11/30/2011  . Iron deficiency anemia 12/15/2011  . 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 Leukocytosis 12/15/2011    Past Surgical History  Procedure Laterality Date  . Cholecystectomy    . Abdominal hysterectomy    . Appendectomy    . Tonsillectomy    . Gastric bypass    . Knee surgery      left x 2 right x 1  . Kidney surgery     Family History  Problem Relation Age of Onset  . Alzheimer's disease Mother   . COPD Mother   . Hypertension Mother   . Heart attack Mother   . Cancer Father     Lung  . Heart attack Sister   . Diabetes Sister   . Heart failure Sister   . Heart attack Brother   . Cancer Brother     Lung    Social History   Social History  . Marital Status: Widowed    Spouse Name: N/A  . Number of Children: N/A  . Years of Education: N/A   Occupational History  . Not on file.   Social History Main Topics  . Smoking status: Never Smoker   . Smokeless tobacco:  Never Used  . Alcohol Use: No  . Drug Use: No  . Sexual Activity: No   Other Topics Concern  . Not on file   Social History Narrative     Objective: BP 134/76 mmHg  Pulse 82  Temp(Src) 98 F (36.7 C) (Oral)  Wt 204 lb (92.534 kg)  Vital signs reviewed. General: Alert and Oriented, No Acute Distress HEENT: Pupils equal, round, reactive to light. Conjunctivae clear.  External ears unremarkable.  Moist mucous membranes. Lungs: Clear and comfortable work of breathing, speaking in full sentences without accessory muscle use. Cardiac: Regular rate and rhythm.  Neuro: CN II-XII grossly intact, gait normal. Extremities: No peripheral edema.  Strong peripheral pulses.  Mental Status: No depression, anxiety, nor agitation. Logical though process. Skin: Warm and dry.  Assessment & Plan: Katheine was seen today for urinary tract infection.  Diagnoses and all orders for this visit:  UTI (lower urinary tract infection) -     cephALEXin (KEFLEX) 500 MG capsule; Take 1 capsule (500 mg total) by mouth 3 (three) times daily. -     Urine culture -     POCT urinalysis dipstick  Obesity -     phentermine (ADIPEX-P) 37.5 MG tablet; Take 1 tablet (37.5 mg total) by mouth daily  before breakfast.   Urinalysis suggestive of UTI therefore start Keflex pending culture. Obesity: Restarting phentermine discussed that she'll have to lose weight in order to be considered for refills in the future,   Return if symptoms worsen or fail to improve.

## 2015-06-17 LAB — URINE CULTURE

## 2015-08-06 ENCOUNTER — Ambulatory Visit (INDEPENDENT_AMBULATORY_CARE_PROVIDER_SITE_OTHER): Payer: Managed Care, Other (non HMO) | Admitting: Family Medicine

## 2015-08-06 ENCOUNTER — Encounter: Payer: Self-pay | Admitting: Family Medicine

## 2015-08-06 VITALS — BP 104/67 | HR 80 | Wt 197.0 lb

## 2015-08-06 DIAGNOSIS — R300 Dysuria: Secondary | ICD-10-CM | POA: Diagnosis not present

## 2015-08-06 DIAGNOSIS — E669 Obesity, unspecified: Secondary | ICD-10-CM | POA: Diagnosis not present

## 2015-08-06 DIAGNOSIS — N390 Urinary tract infection, site not specified: Secondary | ICD-10-CM

## 2015-08-06 LAB — POCT URINALYSIS DIPSTICK
BILIRUBIN UA: NEGATIVE
GLUCOSE UA: NEGATIVE
KETONES UA: NEGATIVE
Nitrite, UA: NEGATIVE
PH UA: 5.5
Protein, UA: NEGATIVE
Spec Grav, UA: 1.02
Urobilinogen, UA: 0.2

## 2015-08-06 MED ORDER — PHENTERMINE HCL 37.5 MG PO TABS: 37.5000 mg | ORAL_TABLET | Freq: Every day | ORAL | Status: DC

## 2015-08-06 MED ORDER — CEFDINIR 300 MG PO CAPS: 300.0000 mg | ORAL_CAPSULE | Freq: Two times a day (BID) | ORAL | Status: AC

## 2015-08-06 MED ORDER — ESTRADIOL 0.1 MG/GM VA CREA: 1.0000 | TOPICAL_CREAM | VAGINAL | Status: DC

## 2015-08-06 NOTE — Addendum Note (Signed)
Addended by: Delrae Alfred on: 08/06/2015 04:00 PM   Modules accepted: Miquel Dunn

## 2015-08-06 NOTE — Progress Notes (Signed)
CC: Selena May is a 65 y.o. female is here for Dysuria; Urinary Frequency; and Urinary Urgency   Subjective: HPI:  Complains of complains of 2 days of dysuria and urinary frequency. She's been getting a urinary tract infection almost every other month for the past year. She denies fevers, chills, or blood in her urine but does have a foul odor and looks cloudy. No interventions as of yet. She felt like Keflex was incredibly effective at helping get rid of her prior urinary tract infection. She denies any flank pain, abdominal pain or genitourinary complaints other than that described above.  She believes she's lost weight since I saw her last in on smoking get a prescription for phentermine.   Review Of Systems Outlined In HPI  Past Medical History  Diagnosis Date  . 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 VUR (vesicoureteric reflux) 11/30/2011  . Iron deficiency anemia 12/15/2011  . 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 Leukocytosis 12/15/2011    Past Surgical History  Procedure Laterality Date  . Cholecystectomy    . Abdominal hysterectomy    . Appendectomy    . Tonsillectomy    . Gastric bypass    . Knee surgery      left x 2 right x 1  . Kidney surgery     Family History  Problem Relation Age of Onset  . Alzheimer's disease Mother   . COPD Mother   . Hypertension Mother   . Heart attack Mother   . Cancer Father     Lung  . Heart attack Sister   . Diabetes Sister   . Heart failure Sister   . Heart attack Brother   . Cancer Brother     Lung    Social History   Social History  . Marital Status: Widowed    Spouse Name: N/A  . Number of Children: N/A  . Years of Education: N/A   Occupational History  . Not on file.   Social History Main Topics  . Smoking status: Never Smoker   . Smokeless tobacco: Never Used  . Alcohol Use: No  . Drug Use: No  . Sexual Activity: No   Other Topics Concern  . Not on file   Social History Narrative     Objective: BP  104/67 mmHg  Pulse 80  Wt 197 lb (89.359 kg)  Vital signs reviewed. General: Alert and Oriented, No Acute Distress HEENT: Pupils equal, round, reactive to light. Conjunctivae clear.  External ears unremarkable.  Moist mucous membranes. Lungs: Clear and comfortable work of breathing, speaking in full sentences without accessory muscle use. Cardiac: Regular rate and rhythm.  Neuro: CN II-XII grossly intact, gait normal. Extremities: No peripheral edema.  Strong peripheral pulses.  Mental Status: No depression, anxiety, nor agitation. Logical though process. Skin: Warm and dry. Assessment & Plan: Meritt was seen today for dysuria, urinary frequency and urinary urgency.  Diagnoses and all orders for this visit:  Dysuria -     POCT Urinalysis Dipstick -     Urine culture; Standing -     Urine culture  Recurrent UTI -     estradiol (ESTRACE) 0.1 MG/GM vaginal cream; Place 1 Applicatorful vaginally once a week. -     cefdinir (OMNICEF) 300 MG capsule; Take 1 capsule (300 mg total) by mouth 2 (two) times daily.  Obesity -     phentermine (ADIPEX-P) 37.5 MG tablet; Take 1 tablet (37.5 mg total) by mouth daily before breakfast.  Urinalysis looks like prior urinalyses when she's had a urinary tract infection therefore starting Omnicef while culture is pending. She's been postmenopausal ever since her 36s, I'm suspicious that maybe her lack of estrogen is causing her to be susceptible for urinary tract infections therefore starting Estrace.   Return if symptoms worsen or fail to improve.

## 2015-08-09 ENCOUNTER — Telehealth: Payer: Self-pay | Admitting: *Deleted

## 2015-08-09 LAB — URINE CULTURE

## 2015-08-09 NOTE — Telephone Encounter (Signed)
PA started for phentermine

## 2015-08-09 NOTE — Telephone Encounter (Signed)
Outcome  Approvedtoday  [id number] Name:Weight loss drugs - benzphetamine (Didrex, Regimex), diethylpropion, phendimetrazine (Bontril PDM, Bontril), phentermine, (Adipex-P, Lomaira, Suprenza) - PA ESI;Status:Approved;Coverage Start Date:07/10/2015;Coverage End Date:08/08/2016;   Pharm and patient notified

## 2015-08-16 ENCOUNTER — Encounter: Payer: Self-pay | Admitting: Family Medicine

## 2015-08-16 DIAGNOSIS — E236 Other disorders of pituitary gland: Secondary | ICD-10-CM | POA: Insufficient documentation

## 2015-10-29 ENCOUNTER — Encounter: Payer: Self-pay | Admitting: Osteopathic Medicine

## 2015-10-29 ENCOUNTER — Ambulatory Visit (INDEPENDENT_AMBULATORY_CARE_PROVIDER_SITE_OTHER): Payer: Managed Care, Other (non HMO) | Admitting: Osteopathic Medicine

## 2015-10-29 VITALS — BP 106/67 | HR 83 | Ht 61.0 in | Wt 193.0 lb

## 2015-10-29 DIAGNOSIS — N3090 Cystitis, unspecified without hematuria: Secondary | ICD-10-CM

## 2015-10-29 DIAGNOSIS — N3946 Mixed incontinence: Secondary | ICD-10-CM

## 2015-10-29 DIAGNOSIS — N952 Postmenopausal atrophic vaginitis: Secondary | ICD-10-CM

## 2015-10-29 DIAGNOSIS — R300 Dysuria: Secondary | ICD-10-CM

## 2015-10-29 DIAGNOSIS — R8290 Unspecified abnormal findings in urine: Secondary | ICD-10-CM | POA: Diagnosis not present

## 2015-10-29 DIAGNOSIS — N390 Urinary tract infection, site not specified: Secondary | ICD-10-CM

## 2015-10-29 LAB — POCT URINALYSIS DIPSTICK
Glucose, UA: NEGATIVE
NITRITE UA: NEGATIVE
PH UA: 6
PROTEIN UA: 100
Spec Grav, UA: 1.02
Urobilinogen, UA: 1

## 2015-10-29 MED ORDER — CEPHALEXIN 500 MG PO CAPS: 500.0000 mg | ORAL_CAPSULE | Freq: Three times a day (TID) | ORAL | 0 refills | Status: AC

## 2015-10-29 MED ORDER — NITROFURANTOIN MACROCRYSTAL 100 MG PO CAPS: 100.0000 mg | ORAL_CAPSULE | Freq: Every day | ORAL | 6 refills | Status: DC

## 2015-10-29 NOTE — Progress Notes (Signed)
Chief Complaint: Possible UTI  History of Present Illness: Selena May is a 65 y.o. female who presents to [place]  today with concerns for Chief Complaint  Patient presents with  . Urinary Tract Infection    Onset: 5-7 days ago Quality: Burning/Urgency, odor  Associated Symptoms: see ROS below - (+) fever/chills  Context:  Previous UTI: 08/2015  Recurrent UTI (3 times/more annually): yes  Abx in past 3 months: yes   Past medical, social and family history reviewed: Past Medical History:  Diagnosis Date  . Iron deficiency anemia 12/15/2011  . Leukocytosis 12/15/2011  . 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 VUR (vesicoureteric reflux) 11/30/2011   Past Surgical History:  Procedure Laterality Date  . ABDOMINAL HYSTERECTOMY    . APPENDECTOMY    . CHOLECYSTECTOMY    . GASTRIC BYPASS    . KIDNEY SURGERY    . KNEE SURGERY     left x 2 right x 1  . TONSILLECTOMY     Social History  Substance Use Topics  . Smoking status: Never Smoker  . Smokeless tobacco: Never Used  . Alcohol use No   The patient has a family history of  Current Outpatient Prescriptions  Medication Sig Dispense Refill  . calcium carbonate (TUMS - DOSED IN MG ELEMENTAL CALCIUM) 500 MG chewable tablet Chew 1 tablet by mouth daily.    . Cholecalciferol (VITAMIN D3) 2000 UNITS TABS Take by mouth.    . Cyanocobalamin (VITAMIN B-12 ER PO) Take by mouth.    . estradiol (ESTRACE) 0.1 MG/GM vaginal cream Place 1 Applicatorful vaginally once a week. 42.5 g 12  . IRON PO Take by mouth.    . ISOtretinoin (CLARAVIS) 40 MG capsule Take 40 mg by mouth 2 (two) times daily.    . Magnesium 400 MG TABS Take by mouth.    . Misc Natural Products (LUTEIN 20 PO) Take by mouth.    . Multiple Vitamin (MULTIVITAMIN) tablet Take 1 tablet by mouth daily.    . phentermine (ADIPEX-P) 37.5 MG tablet Take 1 tablet (37.5 mg total) by mouth daily before  breakfast. 30 tablet 0  . sertraline (ZOLOFT) 50 MG tablet TAKE 1 TABLET BY MOUTH DAILY 90 tablet 0   No current facility-administered medications for this visit.    Allergies  Allergen Reactions  . Ciprofloxacin   . Sulfa Antibiotics     seizures     Review of Systems: CONSTITUTIONAL: Negative fever/chills CARDIAC: No chest pain/pressure/palpitations, no orthopnea RESPIRATORY: No cough/shortness of breath/wheeze GASTROINTESTINAL: No nausea/vomiting/abdominal pain/blood in stool/diarrhea/constipation MUSCULOSKELETAL: see below re: flank pain GENITOURINARY:    Frequency: yes  Hematuria: no  Odor: yes  Incontinence: yes  Flank Pain: no  Vaginal bleeding/discharge: no   Exam:  BP 106/67   Pulse 83   Ht 5\' 1"  (1.549 m)   Wt 193 lb (87.5 kg)   BMI 36.47 kg/m  Constitutional: VSS, see above. General Appearance: alert, well-developed, well-nourished, NAD  Musculoskeletal: Gait normal. No clubbing/cyanosis of digits. Lloyd sign Negative bilateral  Results for orders placed or performed in visit on 10/29/15 (from the past 24 hour(s))  POCT Urinalysis Dipstick     Status: Abnormal   Collection Time: 10/29/15 10:45 AM  Result Value Ref Range   Color, UA YELLOW    Clarity, UA CLEAR    Glucose, UA NEGATIVE    Bilirubin, UA SMALL    Ketones, UA TRACE  Spec Grav, UA 1.020    Blood, UA MODERATE    pH, UA 6.0    Protein, UA 100    Urobilinogen, UA 1.0    Nitrite, UA NEGATIVE    Leukocytes, UA large (3+) (A) Negative    Previous Culture Results: Positive Escherichia coli 08/2015   ASSESSMENT/PLAN:   Patient states that she did well on the Keflex as previously prescribed for UTI. We'll refill this today per patient request, given allergy intolerances to Cipro and Bactrim, Though ideally given patient's relatively late presentation to clinic as well as other history of urinary reflux issues, consider escalation of antibiotic therapy if patient is not doing better on few  days of the Keflex.  After completion of treatment for acute UTI, initiate prophylactic antibiotics daily with nitrofurantoin, again taken into consideration the patient's allergies/drug intolerances. Reevaluate need for continuation of therapy at 6 months. Sooner if symptoms recur/worsen.   ER precautions were reviewed with the patient with regards worsening symptoms over the weekend which may require ER visit/IV antibiotics.   Patient is already on Estrace vaginal cream. Also advised of timed voiding to minimize urinary incontinence, patient  Has had hysterectomy and bladder sling operations, consider repeat follow-up with urology if needed.  Cystitis - Plan: cephALEXin (KEFLEX) 500 MG capsule  Bad odor of urine - Plan: POCT Urinalysis Dipstick  Dysuria - Plan: Urine Culture  Recurrent UTI - Plan: nitrofurantoin (MACRODANTIN) 100 MG capsule  Mixed stress and urge urinary incontinence  Postmenopausal atrophic vaginitis   Patient advised we will call with urine culture results once available, depending on results may need to change therapy. Return in about 6 months (around 04/30/2016), or if symptoms worsen or fail to improve, and as directed by PCP for routine care, for FOLLOW-UP RECURRENT UTI / DICSUSS CONTINUATION OF [place].

## 2015-10-29 NOTE — Patient Instructions (Signed)
Plan:  Start Keflex now for acute urinary tract infection, continue/complete 10 day course  We will call you with results of your urine culture probably Monday or Tuesday, let you know if we need to change antibiotics based on culture results  If your symptoms get worse over the weekend, particularly if you develop fever, abdominal pain, nausea and/or vomiting, please go to closest emergency room as your infection may be severe enough to need IV antibiotics.   After you finish the Keflex, start the nitrofurantoin/Macrobid daily medication, will continue this for 6 months for urinary tract infection prophylaxis. We will reevaluate the need to continue this medicine in another 6 months.

## 2015-11-01 LAB — URINE CULTURE

## 2016-01-01 ENCOUNTER — Other Ambulatory Visit: Payer: Self-pay | Admitting: Family Medicine

## 2016-01-01 DIAGNOSIS — E669 Obesity, unspecified: Secondary | ICD-10-CM

## 2016-03-10 ENCOUNTER — Ambulatory Visit (INDEPENDENT_AMBULATORY_CARE_PROVIDER_SITE_OTHER): Payer: Managed Care, Other (non HMO) | Admitting: Family Medicine

## 2016-03-10 ENCOUNTER — Encounter: Payer: Self-pay | Admitting: Family Medicine

## 2016-03-10 VITALS — BP 132/73 | HR 87 | Temp 98.6°F

## 2016-03-10 DIAGNOSIS — N10 Acute tubulo-interstitial nephritis: Secondary | ICD-10-CM

## 2016-03-10 LAB — POCT URINALYSIS DIPSTICK
Bilirubin, UA: NEGATIVE
Glucose, UA: NEGATIVE
Ketones, UA: NEGATIVE
NITRITE UA: NEGATIVE
PH UA: 6
PROTEIN UA: 30
Spec Grav, UA: 1.02
Urobilinogen, UA: 0.2

## 2016-03-10 MED ORDER — CEFDINIR 300 MG PO CAPS: 300.0000 mg | ORAL_CAPSULE | Freq: Two times a day (BID) | ORAL | 0 refills | Status: DC

## 2016-03-10 NOTE — Progress Notes (Signed)
Selena May is a 65 y.o. female who presents to [facility] today for kidney infection. Patient has a three-day history of urinary frequency urgency dysuria and back pain. She has 12 hours of subjective chills. She denies any vomiting or diarrhea. She has a pertinent medical history for vesicoureteral reflux with recurrent urinary tract infections. She feels well otherwise. She has not tried any treatment yet.   Past Medical History:  Diagnosis Date  . Iron deficiency anemia 12/15/2011  . Leukocytosis 12/15/2011  . 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 VUR (vesicoureteric reflux) 11/30/2011   Past Surgical History:  Procedure Laterality Date  . ABDOMINAL HYSTERECTOMY    . APPENDECTOMY    . CHOLECYSTECTOMY    . GASTRIC BYPASS    . KIDNEY SURGERY    . KNEE SURGERY     left x 2 right x 1  . TONSILLECTOMY     Social History  Substance Use Topics  . Smoking status: Never Smoker  . Smokeless tobacco: Never Used  . Alcohol use No   family history includes Alzheimer's disease in her mother; COPD in her mother; Cancer in her brother and father; Diabetes in her sister; Heart attack in her brother, mother, and sister; Heart failure in her sister; Hypertension in her mother.  ROS as above:  Medications: Current Outpatient Prescriptions  Medication Sig Dispense Refill  . calcium carbonate (TUMS - DOSED IN MG ELEMENTAL CALCIUM) 500 MG chewable tablet Chew 1 tablet by mouth daily.    . cefdinir (OMNICEF) 300 MG capsule Take 1 capsule (300 mg total) by mouth 2 (two) times daily. 14 capsule 0  . Cholecalciferol (VITAMIN D3) 2000 UNITS TABS Take by mouth.    . Cyanocobalamin (VITAMIN B-12 ER PO) Take by mouth.    . estradiol (ESTRACE) 0.1 MG/GM vaginal cream Place 1 Applicatorful vaginally once a week. 42.5 g 12  . IRON PO Take by  mouth.    . ISOtretinoin (CLARAVIS) 40 MG capsule Take 40 mg by mouth 2 (two) times daily.    . Magnesium 400 MG TABS Take by mouth.    . Misc Natural Products (LUTEIN 20 PO) Take by mouth.    . Multiple Vitamin (MULTIVITAMIN) tablet Take 1 tablet by mouth daily.    . nitrofurantoin (MACRODANTIN) 100 MG capsule Take 1 capsule (100 mg total) by mouth at bedtime. 30 capsule 6  . phentermine (ADIPEX-P) 37.5 MG tablet Take 1 tablet (37.5 mg total) by mouth daily before breakfast. 30 tablet 0  . sertraline (ZOLOFT) 50 MG tablet TAKE 1 TABLET BY MOUTH DAILY 90 tablet 0   No current facility-administered medications for this visit.    Allergies  Allergen Reactions  . Ciprofloxacin     seizure  . Sulfa Antibiotics     seizures    Health Maintenance Health Maintenance  Topic Date Due  . Hepatitis C Screening  [birth year]  . HIV Screening  06/16/1966  . TETANUS/TDAP  06/16/1970  . PAP SMEAR  06/15/1972  . ZOSTAVAX  06/16/2011  . MAMMOGRAM  12/25/2013  . COLONOSCOPY  11/25/2015  . INFLUENZA VACCINE  03/31/2016 (Originally 11/02/2015)     Exam:  BP 132/73   Pulse 87   Temp 98.6 F (37 C) (Oral)   SpO2 99%  Gen: Well NAD Nontoxic appearing HEENT: EOMI,  MMM Lungs: Normal work of breathing. CTABL Heart: RRR no MRG Abd: NABS,  Soft. Nondistended, Nontender. Mild bilateral CVA angle tenderness to percussion Exts: Brisk capillary refill, warm and well perfused.    Results for orders placed or performed in visit on 03/10/16 (from the past 72 hour(s))  POCT Urinalysis Dipstick     Status: Abnormal   Collection Time: 03/10/16  1:08 PM  Result Value Ref Range   Color, UA yellow    Clarity, UA cloudy    Glucose, UA neg    Bilirubin, UA neg    Ketones, UA neg    Spec Grav, UA 1.020    Blood, UA mod    pH, UA 6.0    Protein, UA 30    Urobilinogen, UA 0.2    Nitrite, UA neg    Leukocytes, UA small (1+) (A) Negative   No results found.    Assessment and Plan: 65 y.o. female  with probable early pyelonephritis. Patient is afebrile but does have flank tenderness and subjective chills. Plan to obtain urine culture and treat with oral third-generation cephalosporins (Omnicef).   I have reviewed recent urine cultures showing Escherichia coli resistant to amoxicillin and Augmentin but otherwise pansensitive as well as reviewed labs from [facility] showing normal preserved kidney function January 2017   Orders Placed This Encounter  Procedures  . Urine culture  . POCT Urinalysis Dipstick    Discussed warning signs or symptoms. Please see discharge instructions. Patient expresses understanding.

## 2016-03-10 NOTE — Patient Instructions (Signed)
Thank you for coming in today. Take omnicef twice daily.  Use tylenol for pain and fever.  If your belly pain worsens, or you have high fever, bad vomiting, blood in your stool or black tarry stool go to the Emergency Room.    Pyelonephritis, Adult Introduction Pyelonephritis is a kidney infection. The kidneys are the organs that filter a person's blood and move waste out of the bloodstream and into the urine. Urine passes from the kidneys, through the ureters, and into the bladder. There are two main types of pyelonephritis:  Infections that come on quickly without any warning (acute pyelonephritis).  Infections that last for a long period of time (chronic pyelonephritis). In most cases, the infection clears up with treatment and does not cause further problems. More severe infections or chronic infections can sometimes spread to the bloodstream or lead to other problems with the kidneys. What are the causes? This condition is usually caused by:  Bacteria traveling from the bladder to the kidney through infected urine. The urine in the bladder can become infected with bacteria from:  Bladder infection (cystitis).  Inflammation of the prostate gland (prostatitis).  Sexual intercourse, in females.  Bacteria traveling from the bloodstream to the kidney. What increases the risk? This condition is more likely to develop in:  Pregnant women.  Older people.  People who have diabetes.  People who have kidney stones or bladder stones.  People who have other abnormalities of the kidney or ureter.  People who have a catheter placed in the bladder.  People who have cancer.  People who are sexually active.  Women who use spermicides.  People who have had a prior urinary tract infection. What are the signs or symptoms? Symptoms of this condition include:  Frequent urination.  Strong or persistent urge to urinate.  Burning or stinging when urinating.  Abdominal  pain.  Back pain.  Pain in the side or flank area.  Fever.  Chills.  Blood in the urine, or dark urine.  Nausea.  Vomiting. How is this diagnosed? This condition may be diagnosed based on:  Medical history and physical exam.  Urine tests.  Blood tests. You may also have imaging tests of the kidneys, such as an ultrasound or CT scan. How is this treated? Treatment for this condition may depend on the severity of the infection.  If the infection is mild and is found early, you may be treated with antibiotic medicines taken by mouth. You will need to drink fluids to remain hydrated.  If the infection is more severe, you may need to stay in the hospital and receive antibiotics given directly into a vein through an IV tube. You may also need to receive fluids through an IV tube if you are not able to remain hydrated. After your hospital stay, you may need to take oral antibiotics for a period of time. Other treatments may be required, depending on the cause of the infection. Follow these instructions at home: Medicines  Take over-the-counter and prescription medicines only as told by your health care provider.  If you were prescribed an antibiotic medicine, take it as told by your health care provider. Do not stop taking the antibiotic even if you start to feel better. General instructions  Drink enough fluid to keep your urine clear or pale yellow.  Avoid caffeine, tea, and carbonated beverages. They tend to irritate the bladder.  Urinate often. Avoid holding in urine for long periods of time.  Urinate before and after sex.  After a bowel movement, women should cleanse from front to back. Use each tissue only once.  Keep all follow-up visits as told by your health care provider. This is important. Contact a health care provider if:  Your symptoms do not get better after 2 days of treatment.  Your symptoms get worse.  You have a fever. Get help right away  if:  You are unable to take your antibiotics or fluids.  You have shaking chills.  You vomit.  You have severe flank or back pain.  You have extreme weakness or fainting. This information is not intended to replace advice given to you by your health care provider. Make sure you discuss any questions you have with your health care provider. Document Released: 03/20/2005 Document Revised: 08/26/2015 Document Reviewed: 07/13/2014  2017 [employer]

## 2016-03-12 LAB — URINE CULTURE

## 2016-03-24 ENCOUNTER — Encounter: Payer: Self-pay | Admitting: Osteopathic Medicine

## 2016-03-24 ENCOUNTER — Ambulatory Visit (INDEPENDENT_AMBULATORY_CARE_PROVIDER_SITE_OTHER): Payer: Managed Care, Other (non HMO) | Admitting: Osteopathic Medicine

## 2016-03-24 VITALS — BP 136/58 | HR 72 | Wt 198.0 lb

## 2016-03-24 DIAGNOSIS — R011 Cardiac murmur, unspecified: Secondary | ICD-10-CM | POA: Insufficient documentation

## 2016-03-24 DIAGNOSIS — R946 Abnormal results of thyroid function studies: Secondary | ICD-10-CM

## 2016-03-24 DIAGNOSIS — N10 Acute tubulo-interstitial nephritis: Secondary | ICD-10-CM

## 2016-03-24 DIAGNOSIS — R42 Dizziness and giddiness: Secondary | ICD-10-CM | POA: Insufficient documentation

## 2016-03-24 DIAGNOSIS — R7989 Other specified abnormal findings of blood chemistry: Secondary | ICD-10-CM

## 2016-03-24 DIAGNOSIS — N390 Urinary tract infection, site not specified: Secondary | ICD-10-CM

## 2016-03-24 DIAGNOSIS — D649 Anemia, unspecified: Secondary | ICD-10-CM

## 2016-03-24 DIAGNOSIS — I519 Heart disease, unspecified: Secondary | ICD-10-CM

## 2016-03-24 DIAGNOSIS — I5189 Other ill-defined heart diseases: Secondary | ICD-10-CM

## 2016-03-24 DIAGNOSIS — I951 Orthostatic hypotension: Secondary | ICD-10-CM | POA: Insufficient documentation

## 2016-03-24 LAB — RETICULOCYTES
ABS RETIC: 46530 {cells}/uL (ref 20000–80000)
RBC.: 4.23 MIL/uL (ref 3.80–5.10)
Retic Ct Pct: 1.1 %

## 2016-03-24 LAB — CBC WITH DIFFERENTIAL/PLATELET
BASOS ABS: 0 {cells}/uL (ref 0–200)
BASOS PCT: 0 %
EOS ABS: 94 {cells}/uL (ref 15–500)
Eosinophils Relative: 1 %
HEMATOCRIT: 36 % (ref 35.0–45.0)
HEMOGLOBIN: 11.6 g/dL — AB (ref 11.7–15.5)
Lymphocytes Relative: 31 %
Lymphs Abs: 2914 cells/uL (ref 850–3900)
MCH: 27.4 pg (ref 27.0–33.0)
MCHC: 32.2 g/dL (ref 32.0–36.0)
MCV: 85.1 fL (ref 80.0–100.0)
MONO ABS: 752 {cells}/uL (ref 200–950)
MPV: 11.3 fL (ref 7.5–12.5)
Monocytes Relative: 8 %
NEUTROS ABS: 5640 {cells}/uL (ref 1500–7800)
Neutrophils Relative %: 60 %
Platelets: 383 10*3/uL (ref 140–400)
RBC: 4.23 MIL/uL (ref 3.80–5.10)
RDW: 14.8 % (ref 11.0–15.0)
WBC: 9.4 10*3/uL (ref 3.8–10.8)

## 2016-03-24 LAB — IRON AND TIBC
%SAT: 14 % (ref 11–50)
IRON: 60 ug/dL (ref 45–160)
TIBC: 441 ug/dL (ref 250–450)
UIBC: 381 ug/dL (ref 125–400)

## 2016-03-24 LAB — POCT URINALYSIS DIPSTICK
Bilirubin, UA: NEGATIVE
GLUCOSE UA: NEGATIVE
KETONES UA: NEGATIVE
NITRITE UA: NEGATIVE
Spec Grav, UA: 1.02
Urobilinogen, UA: 0.2
pH, UA: 5.5

## 2016-03-24 LAB — FERRITIN: Ferritin: 16 ng/mL — ABNORMAL LOW (ref 20–288)

## 2016-03-24 LAB — TSH: TSH: 7.07 mIU/L — ABNORMAL HIGH

## 2016-03-24 NOTE — Addendum Note (Signed)
Addended by: Doree Albee on: 03/24/2016 11:52 AM   Modules accepted: Orders

## 2016-03-24 NOTE — Patient Instructions (Signed)
Orthostatic Hypotension Orthostatic hypotension is a sudden drop in blood pressure that happens when you quickly change positions, such as when you get up from a seated or lying position. Blood pressure is a measurement of how strongly, or weakly, your blood is pressing against the walls of your arteries. Arteries are blood vessels that carry blood from your heart throughout your body. When blood pressure is too low, you may not get enough blood to your brain or to the rest of your organs. This can cause weakness, light-headedness, rapid heartbeat, and fainting. This can last for just a few seconds or for up to a few minutes. Orthostatic hypotension is usually not a serious problem. However, if it happens frequently or gets worse, it may be a sign of something more serious. What are the causes? This condition may be caused by:  Sudden changes in posture, such as standing up quickly after you have been sitting or lying down.  Blood loss.  Loss of body fluids (dehydration).  Heart problems.  Hormone (endocrine) problems.  Pregnancy.  Severe infection.  Lack of certain nutrients.  Severe allergic reactions (anaphylaxis).  Certain medicines, such as blood pressure medicine or medicines that make the body lose excess fluids (diuretics). Sometimes, this condition can be caused by not taking medicine as directed, such as taking too much of a certain medicine. What increases the risk? Certain factors can make you more likely to develop orthostatic hypotension, including:  Age. Risk increases as you get older.  Conditions that affect the heart or the central nervous system.  Taking certain medicines, such as blood pressure medicine or diuretics.  Being pregnant. What are the signs or symptoms? Symptoms of this condition may include:  Weakness.  Light-headedness.  Dizziness.  Blurred vision.  Fatigue.  Rapid heartbeat.  Fainting, in severe cases. How is this diagnosed? This  condition is diagnosed based on:  Your medical history.  Your symptoms.  Your blood pressure measurement. Your health care provider will check your blood pressure when you are:  Lying down.  Sitting.  Standing. A blood pressure reading is recorded as two numbers, such as "120 over 80" (or 120/80). The first ("top") number is called the systolic pressure. It is a measure of the pressure in your arteries as your heart beats. The second ("bottom") number is called the diastolic pressure. It is a measure of the pressure in your arteries when your heart relaxes between beats. Blood pressure is measured in a unit called mm Hg. Healthy blood pressure for adults is 120/80. If your blood pressure is below 90/60, you may be diagnosed with hypotension. Other information or tests that may be used to diagnose orthostatic hypotension include:  Your other vital signs, such as your heart rate and temperature.  Blood tests.  Tilt table test. For this test, you will be safely secured to a table that moves you from a lying position to an upright position. Your heart rhythm and blood pressure will be monitored during the test. How is this treated? Treatment for this condition may include:  Changing your diet. This may involve eating more salt (sodium) or drinking more water.  Taking medicines to raise your blood pressure.  Changing the dosage of certain medicines you are taking that might be lowering your blood pressure.  Wearing compression stockings. These stockings help to prevent blood clots and reduce swelling in your legs. In some cases, you may need to go to the hospital for:  Fluid replacement. This means you will   receive fluids through an IV tube.  Blood replacement. This means you will receive donated blood through an IV tube (transfusion).  Treating an infection or heart problems, if this applies.  Monitoring. You may need to be monitored while medicines that you are taking wear  off. Follow these instructions at home: Eating and drinking    Drink enough fluid to keep your urine clear or pale yellow.  Eat a healthy diet and follow instructions from your health care provider about eating or drinking restrictions. A healthy diet includes:  Fresh fruits and vegetables.  Whole grains.  Lean meats.  Low-fat dairy products.  Eat extra salt only as directed. Do not add extra salt to your diet unless your health care provider told you to do that.  Eat frequent, small meals.  Avoid standing up suddenly after eating. Medicines   Take over-the-counter and prescription medicines only as told by your health care provider.  Follow instructions from your health care provider about changing the dosage of your current medicines, if this applies.  Do not stop or adjust any of your medicines on your own. General instructions   Wear compression stockings as told by your health care provider.  Get up slowly from lying down or sitting positions. This gives your blood pressure a chance to adjust.  Avoid hot showers and excessive heat as directed by your health care provider.  Return to your normal activities as told by your health care provider. Ask your health care provider what activities are safe for you.  Do not use any products that contain nicotine or tobacco, such as cigarettes and e-cigarettes. If you need help quitting, ask your health care provider.  Keep all follow-up visits as told by your health care provider. This is important. Contact a health care provider if:  You vomit.  You have diarrhea.  You have a fever for more than 2-3 days.  You feel more thirsty than usual.  You feel weak and tired. Get help right away if:  You have chest pain.  You have a fast or irregular heartbeat.  You develop numbness in any part of your body.  You cannot move your arms or your legs.  You have trouble speaking.  You become sweaty or feel  lightheaded.  You faint.  You feel short of breath.  You have trouble staying awake.  You feel confused. This information is not intended to replace advice given to you by your health care provider. Make sure you discuss any questions you have with your health care provider. Document Released: 03/10/2002 Document Revised: 12/07/2015 Document Reviewed: 09/10/2015 Elsevier Interactive Patient Education  2017 [employer]  

## 2016-03-24 NOTE — Progress Notes (Signed)
HPI: Selena May is a 65 y.o. female  who presents to [facility] today, 03/24/16,  for chief complaint of:  Chief Complaint  Patient presents with  . Hospitalization Follow-up  . Pyelonephritis  . Hypertension    Patient was recently in the hospital for treatment of pyelonephritis. Her initial chief complaint upon presentation to the ED was actually dizziness/vertigo, blood pressure was elevated, no EKG was done at that time, diagnosed with pyelonephritis and kept inpatient for 2 days. Had already been started on antibiotics after being seen by a colleague in this clinic for UTI/pyelonephritis. Prior to that, she was on suppressive antibiotics for me for recurrent UTI.   She has a follow-up in place with urology. See below for detailed hospital summary, in brief, some urinary tract abnormalities due to consultations from previous vaginal hysterectomy are present and may be contributing to recurrent UTI issues and left hydronephrosis, urology is planning for a cystoscopy. CT abdomen results reviewed  She complains that dizziness has been going on for the past several months, on and off, typically not associated with exertion, can happen at rest as well. No positional changes. 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 CT brain from 04/19/2015 was normal. MRI brain for vertigo in 03/2014 showed mild small vessel ischemic disease which was also present in 2012.    Hospital discharge summary:  "History of Present Illness: 65yo WF with history of  recurrent UTI presented To the emergency department after several days of dysuria and back pain. In the emergency department creatinine was 0.55. Urinalysis concerning for UTI. White count 13.4. Patient afebrile. 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 Also noted be an enhancing masslike lesion at the insertion of the ureter as well. Patient reports having complications following a vaginal hysterectomy which required reinsertion of ureter. States in the past she's been followed by urology for possible same enhancing masslike lesion but unsure. Patient admitted and hospital course is as follows:  Hospital Course:   *Pyelonephritis Patient admitted and placed on rocephin. Urology consulted. Cultures have remained negative. WBC improved to 8.4. Resume omnicef which had been prescribed a day earlier but treat for 14 days total. Nausea & vomiting Resolved. Tolerating diet. Bladder mass Urology consult as outpatient. Elevated bp No history of HTN. Followup PCP as outpatient to check bp 1 week."     Past medical, surgical, social and family history reviewed: Patient Active Problem List   Diagnosis Date Noted  . 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 Dysfunction of left eustachian tube 02/08/2015  . Vaginitis and vulvovaginitis 01/11/2015  . Chest pain 10/22/2014  . UTI (urinary tract infection) 10/22/2014  . Depression 01/10/2012  . Obesity 12/21/2011  . Iron deficiency anemia 12/15/2011  . 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 Leukocytosis 12/15/2011  . 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 VUR (vesicoureteric reflux) 11/30/2011   Past Surgical History:  Procedure Laterality Date  . ABDOMINAL HYSTERECTOMY    . APPENDECTOMY    .  CHOLECYSTECTOMY    . GASTRIC BYPASS    . KIDNEY SURGERY    . KNEE SURGERY     left x 2 right x 1  .  TONSILLECTOMY     Social History  Substance Use Topics  . Smoking status: Never Smoker  . Smokeless tobacco: Never Used  . Alcohol use No   Family History  Problem Relation Age of Onset  . Alzheimer's disease Mother   . COPD Mother   . Hypertension Mother   . Heart attack Mother   . Cancer Father     Lung  . Heart attack Sister   . Diabetes Sister   . Heart failure Sister   . Heart attack Brother   . Cancer Brother     Lung     Current medication list and allergy/intolerance information reviewed:   Current Outpatient Prescriptions on File Prior to Visit  Medication Sig Dispense Refill  . cefdinir (OMNICEF) 300 MG capsule Take 1 capsule (300 mg total) by mouth 2 (two) times daily. 14 capsule 0  . IRON PO Take by mouth.    . nitrofurantoin (MACRODANTIN) 100 MG capsule Take 1 capsule (100 mg total) by mouth at bedtime. 30 capsule 6  . sertraline (ZOLOFT) 50 MG tablet TAKE 1 TABLET BY MOUTH DAILY 90 tablet 0   No current facility-administered medications on file prior to visit.    Allergies  Allergen Reactions  . Ciprofloxacin     seizure  . Sulfa Antibiotics     seizures      Review of Systems:  Constitutional: No recent illness  HEENT: No  headache, no vision change  Cardiac: No  chest pain, No  pressure, No palpitations, no exertional CP/SOB/dizziness  Respiratory:  No  shortness of breath. No  Cough  Gastrointestinal: No  abdominal pain, no change on bowel habits  Musculoskeletal: No new myalgia/arthralgia  Skin: No  Rash  Hem/Onc: No  easy bruising/bleeding, +hx anemia  Neurologic: No  weakness, +Dizziness last week, none at this time  Psychiatric: No  concerns with depression, No  concerns with anxiety  Exam:  BP (!) 136/58   Pulse 72   Wt 198 lb (89.8 kg)   BMI 37.41 kg/m   Orthostatic VS for the past 24 hrs:  BP- Lying Pulse- Lying BP- Sitting Pulse- Sitting BP- Standing at 0 minutes Pulse- Standing at 0 minutes  03/24/16 0904 152/86 62  145/80 65 130/77 65    Constitutional: VS see above. General Appearance: alert, well-developed, well-nourished, NAD  Ears, Nose, Mouth, Throat: MMM, Normal external inspection ears/nares/mouth/lips/gums.  Neck: No masses, trachea midline.   Respiratory: Normal respiratory effort. no wheeze, no rhonchi, no rales  Cardiovascular: 99991111 normal, + systolic murmur, no rub/gallop auscultated. RRR.   Musculoskeletal: Gait normal. Symmetric and independent movement of all extremities  Neurological: Normal balance/coordination. No tremor. EOMI  Skin: warm, dry, intact.   Psychiatric: Normal judgment/insight. Normal mood and affect. Oriented x3.     EKG interpretation: Rate: 62 Rhythm: sinus No ST/T changes concerning for acute ischemia/infarct   Results for orders placed or performed in visit on 03/24/16 (from the past 72 hour(s))  POCT urinalysis dipstick     Status: Abnormal   Collection Time: 03/24/16  8:30 AM  Result Value Ref Range   Color, UA yellow    Clarity, UA clear    Glucose, UA negative    Bilirubin, UA negative    Ketones, UA negative    Spec Grav, UA 1.020  Blood, UA trace    pH, UA 5.5    Protein, UA trace    Urobilinogen, UA 0.2    Nitrite, UA negative    Leukocytes, UA moderate (2+) (A) Negative      ASSESSMENT/PLAN:   Advised to complete the course of antibiotics that the hospital discharged her on, at that point can restart prophylactic antibiotics for UTI and follow-up with urology. We'll send urine for culture, most recent culture grew multiple species, likely contamination. Leukocytes on dip today, will send for micro-, questionable contamination. Urology recs re: abx (allergies are an issue) and consider vaginal estrogen treatment. ?cystoscopy   EKG ok & orthostatics positive today for dizziness. Will get echo and carotid Dopplers as well. Info printed on orthostatic hypotension, labs pending.   Acute pyelonephritis - Plan: POCT urinalysis  dipstick, CULTURE, URINE COMPREHENSIVE, Urinalysis, microscopic only  Recurrent UTI - Plan: Urinalysis, microscopic only  Dizziness - Plan: ECHOCARDIOGRAM COMPLETE, US Carotid Duplex Bilateral  Anemia, unspecified type - Plan: CBC with Differential/Platelet, TSH, Reticulocytes, Iron and TIBC, Ferritin  Murmur, cardiac  Diastolic dysfunction without heart failure  Orthostatic hypotension      Visit summary with medication list and pertinent instructions was printed for patient to review. All questions at time of visit were answered - patient instructed to contact office with any additional concerns. ER/RTC precautions were reviewed with the patient. Follow-up plan: Return in about 4 weeks (around 04/21/2016) for recheck dizziness if still present, return sooner if needed.  Note: Total time spent 40 minutes, greater than 50% of the visit was spent face-to-face counseling and coordinating care for the following: The primary encounter diagnosis was Acute pyelonephritis. Diagnoses of Recurrent UTI, Dizziness, Anemia, unspecified type, Murmur, cardiac, Diastolic dysfunction without heart failure, and Orthostatic hypotension were also pertinent to this visit.Marland Kitchen

## 2016-03-25 LAB — URINALYSIS, MICROSCOPIC ONLY
BACTERIA UA: NONE SEEN [HPF]
CRYSTALS: NONE SEEN [HPF]
Casts: NONE SEEN [LPF]
WBC, UA: >60 WBC/HPF — AB (ref <=5)
YEAST: NONE SEEN [HPF]

## 2016-03-26 LAB — CULTURE, URINE COMPREHENSIVE: ORGANISM ID, BACTERIA: NO GROWTH

## 2016-03-29 NOTE — Addendum Note (Signed)
Addended by: Maryla Morrow on: 03/29/2016 08:42 AM   Modules accepted: Orders

## 2016-03-30 ENCOUNTER — Ambulatory Visit (HOSPITAL_BASED_OUTPATIENT_CLINIC_OR_DEPARTMENT_OTHER)
Admission: RE | Admit: 2016-03-30 | Discharge: 2016-03-30 | Disposition: A | Discharge status: Home / Self Care | Payer: Managed Care, Other (non HMO) | Source: Ambulatory Visit | Attending: Osteopathic Medicine | Admitting: Osteopathic Medicine

## 2016-03-30 ENCOUNTER — Ambulatory Visit: Payer: Managed Care, Other (non HMO) | Admitting: Osteopathic Medicine

## 2016-03-30 DIAGNOSIS — R42 Dizziness and giddiness: Secondary | ICD-10-CM | POA: Insufficient documentation

## 2016-04-05 ENCOUNTER — Encounter: Payer: Self-pay | Admitting: Osteopathic Medicine

## 2016-04-05 ENCOUNTER — Ambulatory Visit (HOSPITAL_BASED_OUTPATIENT_CLINIC_OR_DEPARTMENT_OTHER): Payer: Managed Care, Other (non HMO) | Attending: Osteopathic Medicine

## 2016-04-05 DIAGNOSIS — Z0131 Encounter for examination of blood pressure with abnormal findings: Secondary | ICD-10-CM | POA: Insufficient documentation

## 2016-04-18 ENCOUNTER — Encounter: Payer: Self-pay | Admitting: Osteopathic Medicine

## 2016-04-18 ENCOUNTER — Ambulatory Visit (INDEPENDENT_AMBULATORY_CARE_PROVIDER_SITE_OTHER): Payer: Managed Care, Other (non HMO) | Admitting: Osteopathic Medicine

## 2016-04-18 VITALS — BP 125/78 | HR 68 | Ht 61.0 in | Wt 198.0 lb

## 2016-04-18 DIAGNOSIS — G459 Transient cerebral ischemic attack, unspecified: Secondary | ICD-10-CM | POA: Diagnosis not present

## 2016-04-18 DIAGNOSIS — E236 Other disorders of pituitary gland: Secondary | ICD-10-CM

## 2016-04-18 DIAGNOSIS — E237 Disorder of pituitary gland, unspecified: Secondary | ICD-10-CM

## 2016-04-18 DIAGNOSIS — R42 Dizziness and giddiness: Secondary | ICD-10-CM | POA: Diagnosis not present

## 2016-04-18 MED ORDER — ASPIRIN EC 325 MG PO TBEC
325.0000 mg | DELAYED_RELEASE_TABLET | Freq: Every day | ORAL | 0 refills | Status: DC
Start: 2016-04-18 — End: 2017-03-28

## 2016-04-18 MED ORDER — ATORVASTATIN CALCIUM 40 MG PO TABS: 40.0000 mg | ORAL_TABLET | Freq: Every day | ORAL | 3 refills | Status: DC

## 2016-04-18 NOTE — Progress Notes (Signed)
HPI: Selena May is a 66 y.o. female  who presents to [facility] today, 04/18/16,  for chief complaint of:  Chief Complaint  Patient presents with  . Dizziness    3 days ago while shopping, episode occurred which worried the patient. Dizzy, nausea, disoriented, friend noted R sided facial droop, ?speaking difficulty - couldn't get the words out - declined ER (friend with her was a nurse) sat down for a bit outside and felt better. Laste about 5 minutes total. No associated chest pain. Both arms feel sore at this point but no unilateral weakness.    Past medical, surgical, social and family history reviewed: Patient Active Problem List   Diagnosis Date Noted  . Encounter for examination of blood pressure with abnormal findings 04/05/2016  . Orthostatic hypotension 03/24/2016  . 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 Murmur, cardiac 03/24/2016  . Anemia 03/24/2016  . Dizziness 03/24/2016  . 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 Dysfunction of left eustachian tube 02/08/2015  . Vaginitis and vulvovaginitis 01/11/2015  . Chest pain 10/22/2014  . UTI (urinary tract infection) 10/22/2014  . Depression 01/10/2012  . Obesity 12/21/2011  . Iron deficiency anemia 12/15/2011  . 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 Leukocytosis 12/15/2011  . 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 VUR (vesicoureteric reflux) 11/30/2011   Past Surgical History:  Procedure Laterality Date  . ABDOMINAL HYSTERECTOMY    . APPENDECTOMY    . CHOLECYSTECTOMY    . GASTRIC BYPASS    . KIDNEY SURGERY    . KNEE SURGERY     left x 2 right x 1  . TONSILLECTOMY     Social History  Substance Use Topics  . Smoking status: Never Smoker  . Smokeless tobacco: Never Used  . Alcohol use No   Family History  Problem Relation Age of Onset  . Alzheimer's disease Mother   . COPD Mother    . Hypertension Mother   . Heart attack Mother   . Cancer Father     Lung  . Heart attack Sister   . Diabetes Sister   . Heart failure Sister   . Heart attack Brother   . Cancer Brother     Lung     Current medication list and allergy/intolerance information reviewed:   Current Outpatient Prescriptions on File Prior to Visit  Medication Sig Dispense Refill  . IRON PO Take by mouth.    . sertraline (ZOLOFT) 50 MG tablet TAKE 1 TABLET BY MOUTH DAILY 90 tablet 0   No current facility-administered medications on file prior to visit.    Allergies  Allergen Reactions  . Ciprofloxacin     seizure  . Sulfa Antibiotics     seizures      Review of Systems:  Constitutional: No recent illness  HEENT: No  headache  Cardiac: No  chest pain, No  pressure, No palpitations  Respiratory:  No  shortness of breath. No  Cough  Gastrointestinal: No  abdominal pain  Musculoskeletal: No new myalgia/arthralgia  Skin: No  Rash  Hem/Onc: No  easy bruising/bleeding, No  abnormal lumps/bumps  Neurologic: No  weakness, No  Dizziness at this time, no meomry deficit or weakness, no facial droop or speech difficulty   Psychiatric: No  concerns with depression, No  concerns with anxiety  Exam:  BP (!) 145/53   Pulse 74   Ht 5' 1"  (  1.549 m)   Wt 198 lb (89.8 kg)   BMI 37.41 kg/m   Constitutional: VS see above. General Appearance: alert, well-developed, well-nourished, NAD  Eyes: Normal lids and conjunctive, non-icteric sclera  Ears, Nose, Mouth, Throat: MMM, Normal external inspection ears/nares/mouth/lips/gums.  Neck: No masses, trachea midline.   Respiratory: Normal respiratory effort. no wheeze, no rhonchi, no rales  Cardiovascular: S1/S2 normal, no murmur, no rub/gallop auscultated. RRR.   Musculoskeletal: Gait normal. Symmetric and independent movement of all extremities  Neurological: Normal balance/coordination. No tremor. EOMI no nystagmus. PERRLA. Strength 5/5 all  extremities. No cerebellar deficit. No cranial nerve deficit on limited exam.   Skin: warm, dry, intact.   Psychiatric: Normal judgment/insight. Normal mood and affect. Oriented x3.    Results for orders placed or performed in visit on 04/18/16 (from the past 72 hour(s))  CBC with Differential/Platelet     Status: Abnormal   Collection Time: 04/18/16  4:18 PM  Result Value Ref Range   WBC 10.6 3.8 - 10.8 K/uL   RBC 4.34 3.80 - 5.10 MIL/uL   Hemoglobin 11.8 11.7 - 15.5 g/dL   HCT 36.8 35.0 - 45.0 %   MCV 84.8 80.0 - 100.0 fL   MCH 27.2 27.0 - 33.0 pg   MCHC 32.1 32.0 - 36.0 g/dL   RDW 15.2 (H) 11.0 - 15.0 %   Platelets 391 140 - 400 K/uL   MPV 11.1 7.5 - 12.5 fL   Neutro Abs 6,466 1,500 - 7,800 cells/uL   Lymphs Abs 3,180 850 - 3,900 cells/uL   Monocytes Absolute 848 200 - 950 cells/uL   Eosinophils Absolute 106 15 - 500 cells/uL   Basophils Absolute 0 0 - 200 cells/uL   Neutrophils Relative % 61 %   Lymphocytes Relative 30 %   Monocytes Relative 8 %   Eosinophils Relative 1 %   Basophils Relative 0 %   Smear Review Criteria for review not met   COMPLETE METABOLIC PANEL WITH GFR     Status: Abnormal   Collection Time: 04/18/16  4:18 PM  Result Value Ref Range   Sodium 143 135 - 146 mmol/L   Potassium 3.8 3.5 - 5.3 mmol/L   Chloride 109 98 - 110 mmol/L   CO2 24 20 - 31 mmol/L   Glucose, Bld 42 (L) 65 - 99 mg/dL   BUN 15 7 - 25 mg/dL   Creat 0.69 0.50 - 0.99 mg/dL    Comment:   For patients > or = 65 years of age: The upper reference limit for Creatinine is approximately 13% higher for people identified as African-American.      Total Bilirubin 0.6 0.2 - 1.2 mg/dL   Alkaline Phosphatase 110 33 - 130 U/L   AST 17 10 - 35 U/L   ALT 10 6 - 29 U/L   Total Protein 6.7 6.1 - 8.1 g/dL   Albumin 3.7 3.6 - 5.1 g/dL   Calcium 8.5 (L) 8.6 - 10.4 mg/dL   GFR, Est African American >89 >=60 mL/min   GFR, Est Non African American >89 >=60 mL/min  Lipid panel     Status: None    Collection Time: 04/18/16  4:18 PM  Result Value Ref Range   Cholesterol 166 <200 mg/dL   Triglycerides 92 <150 mg/dL   HDL 63 >50 mg/dL   Total CHOL/HDL Ratio 2.6 <5.0 Ratio   VLDL 18 <30 mg/dL   LDL Cholesterol 85 <100 mg/dL  Thyroid Panel With TSH  Status: None   Collection Time: 04/18/16  4:18 PM  Result Value Ref Range   T4, Total 10.7 4.5 - 12.0 ug/dL   T3 Uptake 27 22 - 35 %   Free Thyroxine Index 2.9 1.4 - 3.8   TSH 3.58 mIU/L    Comment:   Reference Range   > or = 20 Years  0.40-4.50   Pregnancy Range First trimester  0.26-2.66 Second trimester 0.55-2.73 Third trimester  0.43-2.91       No results found.  EKG interpretation: Rate: 66 Rhythm: sinus No ST/T changes concerning for acute ischemia/infarct     Recent Carotid US showed no stenosis, not sure what to amek of the pressure variation in the brachial system.    ASSESSMENT/PLAN:  Transient neuro symptoms c/w TIA. Initiate ASA and statin therapy, will obtain MRI, consider neuro referral. Pt following with neurosurgery for posterior pituitary gland hypodensity felt to be cyst vs adenoma, will get MRI w/wo for further eval of this as well.   Transient cerebral ischemia, unspecified type - secondary prevention measures w/ BP control, statin, antithrombotic therapy in place. Await MRI - if larger artery stenosis, will initiate dual therapy - Plan: atorvastatin (LIPITOR) 40 MG tablet, aspirin EC 325 MG tablet, CBC with Differential/Platelet, COMPLETE METABOLIC PANEL WITH GFR, Thyroid Panel With TSH, MR MRA HEAD W WO CONTRAST, ECHOCARDIOGRAM COMPLETE  Dizziness - Plan: EKG 12-Lead, CBC with Differential/Platelet, COMPLETE METABOLIC PANEL WITH GFR, Lipid panel, Thyroid Panel With TSH, ECHOCARDIOGRAM COMPLETE  Pituitary mass [facility])    Patient Instructions  PLAN: Starting aspirin high dose and statin (cholesterol) medication for prevention of stroke/TIA Getting MRI and Echocardiogram for evaluation of possible  causes for your episode Plan to follow-up with me in one month, sooner if needed    Visit summary with medication list and pertinent instructions was printed for patient to review. All questions at time of visit were answered - patient instructed to contact office with any additional concerns. ER/RTC precautions were reviewed with the patient. Follow-up plan: Return in about 1 month (around 05/19/2016) for follow-up TIA.

## 2016-04-18 NOTE — Patient Instructions (Signed)
PLAN: Starting aspirin high dose and statin (cholesterol) medication for prevention of stroke/TIA Getting MRI and Echocardiogram for evaluation of possible causes for your episode Plan to follow-up with me in one month, sooner if needed

## 2016-04-19 LAB — CBC WITH DIFFERENTIAL/PLATELET
Basophils Absolute: 0 cells/uL (ref 0–200)
Basophils Relative: 0 %
EOS PCT: 1 %
Eosinophils Absolute: 106 cells/uL (ref 15–500)
HCT: 36.8 % (ref 35.0–45.0)
Hemoglobin: 11.8 g/dL (ref 11.7–15.5)
LYMPHS ABS: 3180 {cells}/uL (ref 850–3900)
Lymphocytes Relative: 30 %
MCH: 27.2 pg (ref 27.0–33.0)
MCHC: 32.1 g/dL (ref 32.0–36.0)
MCV: 84.8 fL (ref 80.0–100.0)
MONOS PCT: 8 %
MPV: 11.1 fL (ref 7.5–12.5)
Monocytes Absolute: 848 cells/uL (ref 200–950)
NEUTROS ABS: 6466 {cells}/uL (ref 1500–7800)
NEUTROS PCT: 61 %
PLATELETS: 391 10*3/uL (ref 140–400)
RBC: 4.34 MIL/uL (ref 3.80–5.10)
RDW: 15.2 % — ABNORMAL HIGH (ref 11.0–15.0)
WBC: 10.6 10*3/uL (ref 3.8–10.8)

## 2016-04-19 LAB — THYROID PANEL WITH TSH
Free Thyroxine Index: 2.9 (ref 1.4–3.8)
T3 UPTAKE: 27 % (ref 22–35)
T4 TOTAL: 10.7 ug/dL (ref 4.5–12.0)
TSH: 3.58 mIU/L

## 2016-04-19 LAB — COMPLETE METABOLIC PANEL WITH GFR
ALT: 10 U/L (ref 6–29)
AST: 17 U/L (ref 10–35)
Albumin: 3.7 g/dL (ref 3.6–5.1)
Alkaline Phosphatase: 110 U/L (ref 33–130)
BUN: 15 mg/dL (ref 7–25)
CO2: 24 mmol/L (ref 20–31)
Calcium: 8.5 mg/dL — ABNORMAL LOW (ref 8.6–10.4)
Chloride: 109 mmol/L (ref 98–110)
Creat: 0.69 mg/dL (ref 0.50–0.99)
GFR, Est African American: >89 mL/min (ref >=60)
GLUCOSE: 42 mg/dL — AB (ref 65–99)
POTASSIUM: 3.8 mmol/L (ref 3.5–5.3)
SODIUM: 143 mmol/L (ref 135–146)
Total Bilirubin: 0.6 mg/dL (ref 0.2–1.2)
Total Protein: 6.7 g/dL (ref 6.1–8.1)

## 2016-04-19 LAB — LIPID PANEL
CHOL/HDL RATIO: 2.6 ratio (ref <5.0)
Cholesterol: 166 mg/dL (ref <200)
HDL: 63 mg/dL (ref >50)
LDL CALC: 85 mg/dL (ref <100)
Triglycerides: 92 mg/dL (ref <150)
VLDL: 18 mg/dL (ref <30)

## 2016-04-21 ENCOUNTER — Other Ambulatory Visit: Payer: Self-pay | Admitting: Osteopathic Medicine

## 2016-04-21 DIAGNOSIS — G459 Transient cerebral ischemic attack, unspecified: Secondary | ICD-10-CM

## 2016-04-26 ENCOUNTER — Other Ambulatory Visit (HOSPITAL_COMMUNITY): Payer: Managed Care, Other (non HMO)

## 2016-05-01 ENCOUNTER — Ambulatory Visit (INDEPENDENT_AMBULATORY_CARE_PROVIDER_SITE_OTHER): Payer: Managed Care, Other (non HMO)

## 2016-05-01 DIAGNOSIS — Z8673 Personal history of transient ischemic attack (TIA), and cerebral infarction without residual deficits: Secondary | ICD-10-CM

## 2016-05-01 DIAGNOSIS — R42 Dizziness and giddiness: Secondary | ICD-10-CM | POA: Diagnosis not present

## 2016-05-01 DIAGNOSIS — G459 Transient cerebral ischemic attack, unspecified: Secondary | ICD-10-CM

## 2016-05-01 DIAGNOSIS — R29810 Facial weakness: Secondary | ICD-10-CM | POA: Diagnosis not present

## 2016-05-01 DIAGNOSIS — R51 Headache: Secondary | ICD-10-CM | POA: Diagnosis not present

## 2016-05-01 MED ORDER — GADOBENATE DIMEGLUMINE 529 MG/ML IV SOLN
18.0000 mL | Freq: Once | INTRAVENOUS | Status: AC | PRN
  Administered 2016-05-01: 18 mL via INTRAVENOUS

## 2016-05-03 ENCOUNTER — Ambulatory Visit (HOSPITAL_BASED_OUTPATIENT_CLINIC_OR_DEPARTMENT_OTHER)
Admission: RE | Admit: 2016-05-03 | Discharge: 2016-05-03 | Disposition: A | Discharge status: Home / Self Care | Payer: Managed Care, Other (non HMO) | Source: Ambulatory Visit | Attending: Osteopathic Medicine | Admitting: Osteopathic Medicine

## 2016-05-03 ENCOUNTER — Other Ambulatory Visit (HOSPITAL_BASED_OUTPATIENT_CLINIC_OR_DEPARTMENT_OTHER): Payer: Managed Care, Other (non HMO)

## 2016-05-03 DIAGNOSIS — I340 Nonrheumatic mitral (valve) insufficiency: Secondary | ICD-10-CM | POA: Diagnosis not present

## 2016-05-03 DIAGNOSIS — G459 Transient cerebral ischemic attack, unspecified: Secondary | ICD-10-CM | POA: Insufficient documentation

## 2016-05-03 DIAGNOSIS — E237 Disorder of pituitary gland, unspecified: Secondary | ICD-10-CM | POA: Diagnosis not present

## 2016-05-03 DIAGNOSIS — I351 Nonrheumatic aortic (valve) insufficiency: Secondary | ICD-10-CM | POA: Insufficient documentation

## 2016-05-03 DIAGNOSIS — I371 Nonrheumatic pulmonary valve insufficiency: Secondary | ICD-10-CM | POA: Insufficient documentation

## 2016-05-03 DIAGNOSIS — R42 Dizziness and giddiness: Secondary | ICD-10-CM | POA: Insufficient documentation

## 2016-05-03 DIAGNOSIS — I071 Rheumatic tricuspid insufficiency: Secondary | ICD-10-CM | POA: Insufficient documentation

## 2016-05-03 NOTE — Progress Notes (Signed)
  Echocardiogram 2D Echocardiogram has been performed.  Selena May 05/03/2016, 9:13 AM

## 2016-06-05 ENCOUNTER — Ambulatory Visit (INDEPENDENT_AMBULATORY_CARE_PROVIDER_SITE_OTHER): Payer: Private Health Insurance - Indemnity | Admitting: Osteopathic Medicine

## 2016-06-05 ENCOUNTER — Encounter: Payer: Self-pay | Admitting: Osteopathic Medicine

## 2016-06-05 VITALS — BP 120/79 | HR 84 | Ht 61.0 in | Wt 191.0 lb

## 2016-06-05 DIAGNOSIS — R42 Dizziness and giddiness: Secondary | ICD-10-CM

## 2016-06-05 DIAGNOSIS — N390 Urinary tract infection, site not specified: Secondary | ICD-10-CM | POA: Diagnosis not present

## 2016-06-05 DIAGNOSIS — N3090 Cystitis, unspecified without hematuria: Secondary | ICD-10-CM | POA: Diagnosis not present

## 2016-06-05 DIAGNOSIS — R300 Dysuria: Secondary | ICD-10-CM

## 2016-06-05 DIAGNOSIS — M549 Dorsalgia, unspecified: Secondary | ICD-10-CM | POA: Diagnosis not present

## 2016-06-05 DIAGNOSIS — Z881 Allergy status to other antibiotic agents status: Secondary | ICD-10-CM | POA: Diagnosis not present

## 2016-06-05 LAB — POCT URINALYSIS DIPSTICK
GLUCOSE UA: NEGATIVE
NITRITE UA: NEGATIVE
Spec Grav, UA: 1.02
UROBILINOGEN UA: 1
pH, UA: 6

## 2016-06-05 MED ORDER — NITROFURANTOIN MONOHYD MACRO 100 MG PO CAPS: 100.0000 mg | ORAL_CAPSULE | Freq: Two times a day (BID) | ORAL | 0 refills | Status: DC

## 2016-06-05 MED ORDER — NITROFURANTOIN MACROCRYSTAL 100 MG PO CAPS: 100.0000 mg | ORAL_CAPSULE | Freq: Every day | ORAL | 1 refills | Status: DC

## 2016-06-05 NOTE — Progress Notes (Signed)
HPI: Selena May is a 66 y.o. female  who presents to [facility] today, 06/05/16,  for chief complaint of:  Chief Complaint  Patient presents with  . Back Pain    UTI:  (+)Dysuria and frequency for at least a week. No fever/chills. (+)back pain. Frequent UTI, had previously been on nitrofurantoin from me for ppx which was helping for awhile but then the infections seemed to come back now.   Previous UCx available: 03/2016 no growth  10/2015 (+)Ecoli R ampicillin 08/2015 (+)Ecoli R ampicillin 06/2015 (+)Ecoli R ampicillin     Dizziness and headaches: Seems to only happen when the patient is at home, worse with waking up once she has been out of the house for that she feels dramatically better. Has some questions about possible CO2 poisoning and would like to be tested for this. Previous visit we were doing a bit of a workup for TIA, no significant concerns on MRI, patient did not follow-up after this to discuss results. Has not had any additional episodes of that severity. No chest pain or difficulty breathing   Past medical history, surgical history, social history and family history reviewed.  Patient Active Problem List   Diagnosis Date Noted  . Encounter for examination of blood pressure with abnormal findings 04/05/2016  . Orthostatic hypotension 03/24/2016  . 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 Murmur, cardiac 03/24/2016  . Anemia 03/24/2016  . Dizziness 03/24/2016  . 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 Dysfunction of left eustachian tube 02/08/2015  . Vaginitis and vulvovaginitis 01/11/2015  . Chest pain 10/22/2014  . UTI (urinary tract infection) 10/22/2014  . Depression 01/10/2012  . Obesity 12/21/2011  . Iron deficiency anemia 12/15/2011  . 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 Leukocytosis 12/15/2011  . 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 VUR (vesicoureteric reflux) 11/30/2011    Current medication list and allergy/intolerance information reviewed.   Current Outpatient Prescriptions on File Prior to Visit  Medication Sig Dispense Refill  . aspirin EC 325 MG tablet Take 1 tablet (325 mg total) by mouth daily. 30 tablet 0  . atorvastatin (LIPITOR) 40 MG tablet Take 1 tablet (40 mg total) by mouth daily. 90 tablet 3  . IRON PO Take by mouth.    . sertraline (ZOLOFT) 50 MG tablet TAKE 1 TABLET BY MOUTH DAILY 90 tablet 0   No current facility-administered medications on file prior to visit.    Allergies  Allergen Reactions  . Ciprofloxacin     seizure  . Sulfa Antibiotics     seizures      Review of Systems:  Constitutional: No recent illness   Cardiac: No  chest pain, No  pressure, No palpitations  Respiratory:  No  shortness of breath. No  Cough  Gastrointestinal: No  abdominal pain  Musculoskeletal: No new myalgia/arthralgia  Skin: No  Rash  Neurologic: +generalized weakness, +occasional dizziness/lightheaded  Psychiatric: No  concerns with depression, No  concerns with anxiety  Exam:  BP 120/79   Pulse 84   Ht 5\' 1"  (1.549 m)   Wt 191 lb (86.6 kg)   BMI 36.09 kg/m   Constitutional: VS see above. General Appearance: alert, well-developed, well-nourished, NAD  Eyes: Normal lids and conjunctive, non-icteric sclera  Ears, Nose, Mouth, Throat: MMM, Normal external inspection ears/nares/mouth/lips/gums.  Neck: No masses, trachea midline.   Respiratory: Normal respiratory effort. no wheeze, no rhonchi,  no rales  Cardiovascular: S1/S2 normal, no murmur, no rub/gallop auscultated. RRR.   Musculoskeletal: Gait normal. Symmetric and independent movement of all extremities  Neurological: Normal balance/coordination. No tremor.  Skin: warm, dry, intact.   Psychiatric: Normal judgment/insight. Normal mood and affect. Oriented x3.     ASSESSMENT/PLAN:   Cystitis - Plan:  nitrofurantoin (MACRODANTIN) 100 MG capsule  Dysuria - Plan: Urine culture, Ambulatory referral to Urology  Acute midline back pain, unspecified back location - Plan: POCT Urinalysis Dipstick  Dizziness, nonspecific - Advised patient come back first thing in the morning for testing, should have home tested if she is concerned about carbon monoxide. ER precautions. +/- neuro  - Plan: Blood Gas, Venous, Carboxyhemoglobin  Recurrent UTI - Initiate daily nitrofurantoin once treatment regimen is complete. Refer to urology - Plan: nitrofurantoin, macrocrystal-monohydrate, (MACROBID) 100 MG capsule, Ambulatory referral to Urology  Hx of antibiotic allergy - Plan: Ambulatory referral to Urology      Follow-up plan: Return in about 4 weeks (around 07/03/2016) for talk more about dizziness problem.  Visit summary with medication list and pertinent instructions was printed for patient to review, alert [place] if any changes needed. All questions at time of visit were answered - patient instructed to contact office with any additional concerns. ER/RTC precautions were reviewed with the patient and understanding verbalized.

## 2016-06-08 LAB — URINE CULTURE

## 2017-02-26 ENCOUNTER — Ambulatory Visit (INDEPENDENT_AMBULATORY_CARE_PROVIDER_SITE_OTHER): Payer: Private Health Insurance - Indemnity

## 2017-02-26 ENCOUNTER — Ambulatory Visit (INDEPENDENT_AMBULATORY_CARE_PROVIDER_SITE_OTHER): Payer: Private Health Insurance - Indemnity | Admitting: Family Medicine

## 2017-02-26 ENCOUNTER — Encounter: Payer: Self-pay | Admitting: Family Medicine

## 2017-02-26 VITALS — BP 150/82 | HR 90 | Wt 206.0 lb

## 2017-02-26 DIAGNOSIS — M545 Low back pain, unspecified: Secondary | ICD-10-CM

## 2017-02-26 DIAGNOSIS — M5116 Intervertebral disc disorders with radiculopathy, lumbar region: Secondary | ICD-10-CM

## 2017-02-26 MED ORDER — CYCLOBENZAPRINE HCL 5 MG PO TABS: 5.0000 mg | ORAL_TABLET | Freq: Every evening | ORAL | 1 refills | Status: DC | PRN

## 2017-02-26 NOTE — Patient Instructions (Signed)
Thank you for coming in today. Attend PT.  Use a heating pad.  Use a TENS unit.  Take aleve or tylenol for pain.  Use flexeril at bedtime as needed.  Come back or go to the emergency room if you notice new weakness new numbness problems walking or bowel or bladder problems.  Recheck if not better.   TENS UNIT: This is helpful for muscle pain and spasm.   Search and Purchase a TENS 7000 2nd edition at  www.tenspros.com or www.[employer].com It should be less than $30.     TENS unit instructions: Do not shower or bathe with the unit on Turn the unit off before removing electrodes or batteries If the electrodes lose stickiness add a drop of water to the electrodes after they are disconnected from the unit and place on plastic sheet. If you continued to have difficulty, call the TENS unit company to purchase more electrodes. Do not apply lotion on the skin area prior to use. Make sure the skin is clean and dry as this will help prolong the life of the electrodes. After use, always check skin for unusual red areas, rash or other skin difficulties. If there are any skin problems, does not apply electrodes to the same area. Never remove the electrodes from the unit by pulling the wires. Do not use the TENS unit or electrodes other than as directed. Do not change electrode placement without consultating your therapist or physician. Keep 2 fingers with between each electrode. Wear time ratio is 2:1, on to off times.    For example on for 30 minutes off for 15 minutes and then on for 30 minutes off for 15 minutes   Lumbosacral Strain Lumbosacral strain is an injury that causes pain in the lower back (lumbosacral spine). This injury usually occurs from overstretching the muscles or ligaments along your spine. A strain can affect one or more muscles or cord-like tissues that connect bones to other bones (ligaments). What are the causes? This condition may be caused by:  A hard, direct hit  (blow) to the back.  Excessive stretching of the lower back muscles. This may result from: ? A fall. ? Lifting something heavy. ? Repetitive movements such as bending or crouching.  What increases the risk? The following factors may increase your risk of getting this condition:  Participating in sports or activities that involve: ? A sudden twist of the back. ? Pushing or pulling motions.  Being overweight or obese.  Having poor strength and flexibility, especially tight hamstrings or weak muscles in the back or abdomen.  Having too much of a curve in the lower back.  Having a pelvis that is tilted forward.  What are the signs or symptoms? The main symptom of this condition is pain in the lower back, at the site of the strain. Pain may extend (radiate) down one or both legs. How is this diagnosed? This condition is diagnosed based on:  Your symptoms.  Your medical history.  A physical exam. ? Your health care provider may push on certain areas of your back to determine the source of your pain. ? You may be asked to bend forward, backward, and side to side to assess the severity of your pain and your range of motion.  Imaging tests, such as: ? X-rays. ? MRI.  How is this treated? Treatment for this condition may include:  Putting heat and cold on the affected area.  Medicines to help relieve pain and relax your muscles (  muscle relaxants).  NSAIDs to help reduce swelling and discomfort.  When your symptoms improve, it is important to gradually return to your normal routine as soon as possible to reduce pain, avoid stiffness, and avoid loss of muscle strength. Generally, symptoms should improve within 6 weeks of treatment. However, recovery time varies. Follow these instructions at home: Managing pain, stiffness, and swelling   If directed, put ice on the injured area during the first 24 hours after your strain. ? Put ice in a plastic bag. ? Place a towel between  your skin and the bag. ? Leave the ice on for 20 minutes, 2-3 times a day.  If directed, put heat on the affected area as often as told by your health care provider. Use the heat source that your health care provider recommends, such as a moist heat pack or a heating pad. ? Place a towel between your skin and the heat source. ? Leave the heat on for 20-30 minutes. ? Remove the heat if your skin turns bright red. This is especially important if you are unable to feel pain, heat, or cold. You may have a greater risk of getting burned. Activity  Rest and return to your normal activities as told by your health care provider. Ask your health care provider what activities are safe for you.  Avoid activities that take a lot of energy for as long as told by your health care provider. General instructions  Take over-the-counter and prescription medicines only as told by your health care provider.  Donot drive or use heavy machinery while taking prescription pain medicine.  Do not use any products that contain nicotine or tobacco, such as cigarettes and e-cigarettes. If you need help quitting, ask your health care provider.  Keep all follow-up visits as told by your health care provider. This is important. How is this prevented?  Use correct form when playing sports and lifting heavy objects.  Use good posture when sitting and standing.  Maintain a healthy weight.  Sleep on a mattress with medium firmness to support your back.  Be safe and responsible while being active to avoid falls.  Do at least 150 minutes of moderate-intensity exercise each week, such as brisk walking or water aerobics. Try a form of exercise that takes stress off your back, such as swimming or stationary cycling.  Maintain physical fitness, including: ? Strength. ? Flexibility. ? Cardiovascular fitness. ? Endurance. Contact a health care provider if:  Your back pain does not improve after 6 weeks of  treatment.  Your symptoms get worse. Get help right away if:  Your back pain is severe.  You cannot stand or walk.  You have difficulty controlling when you urinate or when you have a bowel movement.  You feel nauseous or you vomit.  Your feet get very cold.  You have numbness, tingling, weakness, or problems using your arms or legs.  You develop any of the following: ? Shortness of breath. ? Dizziness. ? Pain in your legs. ? Weakness in your buttocks or legs. ? Discoloration of the skin on your toes or legs. This information is not intended to replace advice given to you by your health care provider. Make sure you discuss any questions you have with your health care provider. Document Released: 12/28/2004 Document Revised: 10/08/2015 Document Reviewed: 08/22/2015 Elsevier Interactive Patient Education  2017 [employer].

## 2017-02-27 NOTE — Progress Notes (Signed)
Selena May is a 66 y.o. female who presents to [facility] today for back pain and hip pain. Uyen slipped and fell about a week ago landing on her buttocks. Since then she's had worsening pain in her low back and right lateral hip and buttocks. She denies significant pain immediately after her fall. She denies any head injury. She denies any radiating pain or bladder dysfunction. She notes the pain is worse with activity and better with rest over she does have pain when she lies on her right side. She has tried over-the-counter medicines for pain which have helped. She denies any vomiting diarrhea chest pain or palpitations.   Past Medical History:  Diagnosis Date  . Iron deficiency anemia 12/15/2011  . Leukocytosis 12/15/2011  . Pyelonephritis   . 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 VUR (vesicoureteric reflux) 11/30/2011   Past Surgical History:  Procedure Laterality Date  . ABDOMINAL HYSTERECTOMY    . APPENDECTOMY    . CHOLECYSTECTOMY    . GASTRIC BYPASS    . KIDNEY SURGERY    . KNEE SURGERY     left x 2 right x 1  . TONSILLECTOMY     Social History   Tobacco Use  . Smoking status: Never Smoker  . Smokeless tobacco: Never Used  Substance Use Topics  . Alcohol use: No     ROS:  As above   Medications: Current Outpatient Medications  Medication Sig Dispense Refill  . cefdinir (OMNICEF) 300 MG capsule Take 300 mg by mouth.    . IRON PO Take by mouth.    . sertraline (ZOLOFT) 50 MG tablet TAKE 1 TABLET BY MOUTH DAILY 90 tablet 0  . aspirin EC 325 MG tablet Take 1 tablet (325 mg total) by mouth daily. (Patient not taking: Reported on 02/26/2017) 30 tablet 0  . atorvastatin (LIPITOR) 40 MG tablet Take 1 tablet (40 mg total) by mouth daily. (Patient not taking: Reported on 02/26/2017) 90 tablet 3  . cyclobenzaprine (FLEXERIL) 5 MG tablet Take 1-2 tablets (5-10 mg total) by mouth at  bedtime as needed for muscle spasms. 30 tablet 1   No current facility-administered medications for this visit.    Allergies  Allergen Reactions  . Ciprofloxacin     seizure  . Sulfa Antibiotics     seizures     Exam:  BP (!) 150/82   Pulse 90   Wt 206 lb (93.4 kg)   BMI 38.92 kg/m  General: Well Developed, well nourished, and in no acute distress.  Neuro/Psych: Alert and oriented x3, extra-ocular muscles intact, able to move all 4 extremities, sensation grossly intact. Skin: Warm and dry, no rashes noted.  Respiratory: Not using accessory muscles, speaking in full sentences, trachea midline.  Cardiovascular: Pulses palpable, no extremity edema. Abdomen: Does not appear distended. MSK:  Mildly tender to spinal midline at the lower lumbar levels however no step-offs are palpated. She is also tender to palpation at the bilateral lumbar paraspinal muscle groups. Lumbar motion is limited by pain and flexion extension rotation and lateral flexion. Lurched strength is equal and normal throughout. Reflexes are equal throughout bilateral extremities. Right hip normal-appearing normal motion tender palpation greater trochanter. Pain with resisted hip abduction. Gait mildly antalgic  X-ray L-spine and sacrum showed degenerative changes with no obvious fractures. Awaiting formal radiology review     Assessment and Plan: 66 y.o. female with  lumbar pain and sacrum  pain and lateral hip pain following fall. Doubtful for fractures based on x-ray however patient does have myofascial disruption and dysfunction. We had a lengthy discussion about options. Plan for limited cyclobenzaprine at bedtime as she does have some trouble sleeping with this pain. Additionally we'll refer to physical therapy and using heating pads and tension. Continue intermittent Tylenol or NSAIDs as needed.    Orders Placed This Encounter  Procedures  . DG Lumbar Spine Complete    Standing Status:   Future     Number of Occurrences:   1    Standing Expiration Date:   04/28/2018    Order Specific Question:   Reason for Exam (SYMPTOM  OR DIAGNOSIS REQUIRED)    Answer:   fall pain coccyx and lumnbar midline    Order Specific Question:   Preferred imaging location?    Answer:   [facility]    Order Specific Question:   Radiology Contrast Protocol - do NOT remove file path    Answer:   [email or web address]  . DG Sacrum/Coccyx    Standing Status:   Future    Number of Occurrences:   1    Standing Expiration Date:   04/28/2018    Order Specific Question:   Reason for Exam (SYMPTOM  OR DIAGNOSIS REQUIRED)    Answer:   fall pain coccyx    Order Specific Question:   Preferred imaging location?    Answer:   [facility]    Order Specific Question:   Radiology Contrast Protocol - do NOT remove file path    Answer:   [email or web address]  . Ambulatory referral to Physical Therapy    Referral Priority:   Routine    Referral Type:   Physical Medicine    Referral Reason:   Specialty Services Required    Requested Specialty:   Physical Therapy   Meds ordered this encounter  Medications  . cyclobenzaprine (FLEXERIL) 5 MG tablet    Sig: Take 1-2 tablets (5-10 mg total) by mouth at bedtime as needed for muscle spasms.    Dispense:  30 tablet    Refill:  1    Discussed warning signs or symptoms. Please see discharge instructions. Patient expresses understanding.

## 2017-02-28 ENCOUNTER — Telehealth: Payer: Self-pay | Admitting: Family Medicine

## 2017-02-28 DIAGNOSIS — S322XXA Fracture of coccyx, initial encounter for closed fracture: Principal | ICD-10-CM

## 2017-02-28 DIAGNOSIS — S3210XA Unspecified fracture of sacrum, initial encounter for closed fracture: Secondary | ICD-10-CM

## 2017-02-28 NOTE — Telephone Encounter (Signed)
CT ordered to follow up possible sacrum fracture seen on xray

## 2017-03-01 ENCOUNTER — Ambulatory Visit (INDEPENDENT_AMBULATORY_CARE_PROVIDER_SITE_OTHER): Payer: Private Health Insurance - Indemnity

## 2017-03-01 DIAGNOSIS — M1288 Other specific arthropathies, not elsewhere classified, other specified site: Secondary | ICD-10-CM

## 2017-03-01 DIAGNOSIS — M160 Bilateral primary osteoarthritis of hip: Secondary | ICD-10-CM

## 2017-03-01 DIAGNOSIS — S3210XA Unspecified fracture of sacrum, initial encounter for closed fracture: Secondary | ICD-10-CM

## 2017-03-01 DIAGNOSIS — I700 Atherosclerosis of aorta: Secondary | ICD-10-CM

## 2017-03-01 DIAGNOSIS — S322XXA Fracture of coccyx, initial encounter for closed fracture: Principal | ICD-10-CM

## 2017-03-06 ENCOUNTER — Encounter: Payer: Self-pay | Admitting: Family Medicine

## 2017-03-06 DIAGNOSIS — I700 Atherosclerosis of aorta: Secondary | ICD-10-CM | POA: Insufficient documentation

## 2017-03-12 ENCOUNTER — Ambulatory Visit: Payer: Private Health Insurance - Indemnity | Admitting: Physical Therapy

## 2017-03-23 ENCOUNTER — Encounter: Payer: Self-pay | Admitting: Rehabilitative and Restorative Service Providers"

## 2017-03-23 ENCOUNTER — Ambulatory Visit (INDEPENDENT_AMBULATORY_CARE_PROVIDER_SITE_OTHER): Payer: Private Health Insurance - Indemnity | Admitting: Rehabilitative and Restorative Service Providers"

## 2017-03-23 DIAGNOSIS — M79604 Pain in right leg: Secondary | ICD-10-CM | POA: Diagnosis not present

## 2017-03-23 DIAGNOSIS — R29898 Other symptoms and signs involving the musculoskeletal system: Secondary | ICD-10-CM | POA: Diagnosis not present

## 2017-03-23 DIAGNOSIS — M545 Low back pain: Secondary | ICD-10-CM | POA: Diagnosis not present

## 2017-03-23 NOTE — Patient Instructions (Signed)
Abdominal Bracing With Pelvic Floor (Hook-Lying)    With neutral spine, tighten pelvic floor and abdominals sucking belly button to back bone; tighten muscles in low back at waist. Hold 10 sec  Repeat _10__ times. Do _several __ times a day. Work towards doing this in sitting; standing; walking and with all functional activities    HIP: Hamstrings - Supine  Place strap around foot. Raise leg up, keeping knee straight.  Bend opposite knee to protect back if indicated. Hold 30 seconds. 3 reps per set, 2-3 sets per day  Pull leg straight up like above - then pull leg out to the side  Hold 30 sec x 3 reps 2-3 times/day    Outer Hip Stretch: Reclined IT Band Stretch (Strap)   Strap around one foot, pull leg across body until you feel a pull or stretch in the outside of your hip, with shoulders on mat. Hold for 30 seconds. Repeat 3 times each leg. 2-3 times/day.   Quads / HF, Prone KNEE: Quadriceps - Prone    Place strap around ankle. Bring ankle toward buttocks. Press hip into surface. Hold 30 seconds. Repeat 3 times per session. Do 2-3 sessions per day.   TENS UNIT: This is helpful for muscle pain and spasm.   Search and Purchase a TENS 7000 2nd edition at www.tenspros.com. It should be less than $30.     TENS unit instructions: Do not shower or bathe with the unit on Turn the unit off before removing electrodes or batteries If the electrodes lose stickiness add a drop of water to the electrodes after they are disconnected from the unit and place on plastic sheet. If you continued to have difficulty, call the TENS unit company to purchase more electrodes. Do not apply lotion on the skin area prior to use. Make sure the skin is clean and dry as this will help prolong the life of the electrodes. After use, always check skin for unusual red areas, rash or other skin difficulties. If there are any skin problems, does not apply electrodes to the same area. Never remove the  electrodes from the unit by pulling the wires. Do not use the TENS unit or electrodes other than as directed. Do not change electrode placement without consultating your therapist or physician. Keep 2 fingers with between each electrode.  Sleeping on Back  Place pillow under knees. A pillow with cervical support and a roll around waist are also helpful. Copyright  VHI. All rights reserved.  Sleeping on Side Place pillow between knees. Use cervical support under neck and a roll around waist as needed. Copyright  VHI. All rights reserved.   Sleeping on Stomach   If this is the only desirable sleeping position, place pillow under lower legs, and under stomach or chest as needed.  Posture - Sitting   Sit upright, head facing forward. Try using a roll to support lower back. Keep shoulders relaxed, and avoid rounded back. Keep hips level with knees. Avoid crossing legs for long periods. Stand to Sit / Sit to Stand   To sit: Bend knees to lower self onto front edge of chair, then scoot back on seat. To stand: Reverse sequence by placing one foot forward, and scoot to front of seat. Use rocking motion to stand up.   Work Height and Reach  Ideal work height is no more than 2 to 4 inches below elbow level when standing, and at elbow level when sitting. Reaching should be limited to arm's length,  with elbows slightly bent.  Bending  Bend at hips and knees, not back. Keep feet shoulder-width apart.    Posture - Standing   Good posture is important. Avoid slouching and forward head thrust. Maintain curve in low back and align ears over shoul- ders, hips over ankles.  Alternating Positions   Alternate tasks and change positions frequently to reduce fatigue and muscle tension. Take rest breaks. Computer Work   Position work to Programmer, multimedia. Use proper work and seat height. Keep shoulders back and down, wrists straight, and elbows at right angles. Use chair that provides full back  support. Add footrest and lumbar roll as needed.  Getting Into / Out of Car  Lower self onto seat, scoot back, then bring in one leg at a time. Reverse sequence to get out.  Dressing  Lie on back to pull socks or slacks over feet, or sit and bend leg while keeping back straight.    Housework - Sink  Place one foot on ledge of cabinet under sink when standing at sink for prolonged periods.   Pushing / Pulling  Pushing is preferable to pulling. Keep back in proper alignment, and use leg muscles to do the work.  Deep Squat   Squat and lift with both arms held against upper trunk. Tighten stomach muscles without holding breath. Use smooth movements to avoid jerking.  Avoid Twisting   Avoid twisting or bending back. Pivot around using foot movements, and bend at knees if needed when reaching for articles.  Carrying Luggage   Distribute weight evenly on both sides. Use a cart whenever possible. Do not twist trunk. Move body as a unit.   Lifting Principles .Maintain proper posture and head alignment. .Slide object as close as possible before lifting. .Move obstacles out of the way. .Test before lifting; ask for help if too heavy. .Tighten stomach muscles without holding breath. .Use smooth movements; do not jerk. .Use legs to do the work, and pivot with feet. .Distribute the work load symmetrically and close to the center of trunk. .Push instead of pull whenever possible.   Ask For Help   Ask for help and delegate to others when possible. Coordinate your movements when lifting together, and maintain the low back curve.  Log Roll   Lying on back, bend left knee and place left arm across chest. Roll all in one movement to the right. Reverse to roll to the left. Always move as one unit. Housework - Sweeping  Use long-handled equipment to avoid stooping.   Housework - Wiping  Position yourself as close as possible to reach work surface. Avoid straining your  back.  Laundry - Unloading Wash   To unload small items at bottom of washer, lift leg opposite to arm being used to reach.  [place] close to area to be raked. Use arm movements to do the work. Keep back straight and avoid twisting.     Cart  When reaching into cart with one arm, lift opposite leg to keep back straight.   Getting Into / Out of Bed  Lower self to lie down on one side by raising legs and lowering head at the same time. Use arms to assist moving without twisting. Bend both knees to roll onto back if desired. To sit up, start from lying on side, and use same move-ments in reverse. Housework - Vacuuming  Hold the vacuum with arm held at side. Step back and forth to move it, keeping head  up. Avoid twisting.   Laundry - IT consultant so that bending and twisting can be avoided.   Laundry - Unloading Dryer  Squat down to reach into clothes dryer or use a reacher.  Gardening - Weeding / Probation officer or Kneel. Knee pads may be helpful.

## 2017-03-23 NOTE — Therapy (Signed)
[place] [facility], [place], [address] Phone: [phone]   Fax:  [phone]  Physical Therapy Evaluation  Patient Details  Name: Selena May MRN: [id number] Date of Birth: 21-Nov-1950 Referring Provider: Dr Lynne Leader    Encounter Date: 03/23/2017  PT End of Session - 03/23/17 1445    Visit Number  1    Number of Visits  12    Date for PT Re-Evaluation  05/04/17    PT Start Time  5102    PT Stop Time  1546    PT Time Calculation (min)  61 min    Activity Tolerance  Patient tolerated treatment well       Past Medical History:  Diagnosis Date  . Iron deficiency anemia 12/15/2011  . Leukocytosis 12/15/2011  . Pyelonephritis   . 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 VUR (vesicoureteric reflux) 11/30/2011    Past Surgical History:  Procedure Laterality Date  . ABDOMINAL HYSTERECTOMY    . APPENDECTOMY    . CHOLECYSTECTOMY    . GASTRIC BYPASS    . KIDNEY SURGERY    . KNEE SURGERY     left x 2 right x 1  . TONSILLECTOMY      There were no vitals filed for this visit.   Subjective Assessment - 03/23/17 1455    Subjective  Patient reports that she slipped and fell while out to eat ~ 5 weeks ago. She fell back landing on her buttocks and striking her head. She was seen by MD a few days later. She was treated with medication. She continues to have pain in the LB on a daily basis.     Pertinent History  multiple surgeries - none for back    How long can you sit comfortably?  not at all     How long can you stand comfortably?  not at all     How long can you walk comfortably?  2-3 min     Diagnostic tests  xrays MRI     Patient Stated Goals  get rid of the back pain     Currently in Pain?  Yes    Pain Score  6     Pain Location  Back    Pain Orientation  Mid;Lower    Pain Descriptors / Indicators  Sharp    Pain Type  Acute pain    Pain Radiating Towards  Rt  hip into the bend of the knee medial knee     Pain Onset  More than a month ago    Pain Frequency  Constant    Aggravating Factors   prolonged positions; moving     Pain Relieving Factors  heat; meds; massage; elevate Rt LE and bend knee with heating pad on it          [facility] PT Assessment - 03/23/17 0001      Assessment   Medical Diagnosis  LBP     Referring Provider  Dr Lynne Leader     Onset Date/Surgical Date  02/22/17    Hand Dominance  Right    Next MD Visit  03/28/17    Prior Therapy  none for back       Precautions   Precautions  None      Balance Screen   Has the patient fallen in the past 6 months  Yes    How many times?  1  Has the patient had a decrease in activity level because of a fear of falling?   No    Is the patient reluctant to leave their home because of a fear of falling?   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     Leisure  singing gospel and country; walking 1-2 times/wk 20-60 min prior to accident       Observation/Other Assessments   Focus on Therapeutic Outcomes (FOTO)   65% limitation       Sensation   Additional Comments  bilat toes intermittently feel numb       Posture/Postural Control   Posture Comments  head forward; shoulders rounded and elevated; sits with weight shifted to the Rt standing       AROM   Overall AROM Comments  LE mobility is limited by pain greatest in Rt hip and knee - tightness end ranges     AROM Assessment Site  -- discomfort with all trunk motions     Lumbar Flexion  50%    Lumbar Extension  30%    Lumbar - Right Side Bend  65%    Lumbar - Left Side Bend  65%    Lumbar - Right Rotation  45%    Lumbar - Left Rotation  45%      Strength   Overall Strength Comments  unable to assess with manual muscle testing - due to pain -at least  functional against gravity       Flexibility   Hamstrings  tight Rt ~ 60 deg; Lt 70 deg    Quadriceps  tight Rt >> Lt     ITB  tight Rt     Piriformis  tight Rt       Palpation   Spinal mobility  hypomobile and very tender to palpation - into sacral and coccyx area     SI assessment   tender to palpation bilat     Palpation comment  muscular tightness posterior and medial knee/hamstrings and adductors Rt LE; lumbar paraspinals to sacrum bilat; psoas Rt > Lt              Objective measurements completed on examination: See above findings.      [facility] Adult PT Treatment/Exercise - 03/23/17 0001      Lumbar Exercises: Stretches   Passive Hamstring Stretch  3 reps;30 seconds supine with strap     Quad Stretch  3 reps;30 seconds prone with strap     ITB Stretch  2 reps;30 seconds supine with strap     ITB Stretch Limitations  adductor stretch with strap 30 sec x 2       Lumbar Exercises: Supine   Other Supine Lumbar Exercises  3 part core 10 sec x 10       Moist Heat Therapy   Number Minutes Moist Heat  20 Minutes    Moist Heat Location  Lumbar Spine;Knee Rt knee       Electrical Stimulation   Electrical Stimulation Location  bilat sacral area; Rt knee     Electrical Stimulation Action  HVGS    Electrical Stimulation Parameters  to tolerance    Electrical Stimulation Goals  Pain;Tone  PT Education - 03/23/17 1525    Education provided  Yes    Education Details  HEP TENS back care     Person(s) Educated  Patient    Methods  Explanation;Demonstration;Tactile cues;Verbal cues;Handout    Comprehension  Verbalized understanding;Returned demonstration;Verbal cues required;Tactile cues required          PT Long Term Goals - 03/23/17 1608      PT LONG TERM GOAL #1   Title  Improve trunk and LE mobilty and ROM with patient to demonstrate ROM WFL's and pain free throughout to facilitate return to normal functional activities 05/04/17    Time  6     Period  Weeks    Status  New      PT LONG TERM GOAL #2   Title  Patient to report decresaed pain by 75-80% to sit; stand; walk for functional times of 0-15 min without pain 05/04/17    Time  6    Period  Weeks    Status  New      PT LONG TERM GOAL #3   Title  Functional strength to allow patient to return to normal functional activities without limitation 05/04/17    Time  6    Period  Weeks    Status  New      PT LONG TERM GOAL #4   Title  Independent in HEP 05/04/17    Time  6    Period  Weeks    Status  New      PT LONG TERM GOAL #5   Title  Improve FOTO to </= 44% limitation 05/04/17    Time  6    Period  Weeks    Status  New             Plan - 03/23/17 1603    Clinical Impression Statement  Mieshia presents s/p fall ~02/22/17 with persistent LBP/sacral/coccyx pain and Rt LE pain. She has limited trunk and LE ROM; significant tightness and tenderness/pain with palpation through the LB/sacrum/coccyx area; Rt hamstrings; medial and lateral Rt knee. She has abnormal posture and alignment; abnormal gait with limp Rt LE; pain limiting functional activities. Patient will benefirt fro mPT to address problems identified.     Clinical Presentation  Stable    Clinical Decision Making  Low    Rehab Potential  Good    PT Frequency  2x / week    PT Duration  6 weeks    PT Treatment/Interventions  Patient/family education;ADLs/Self Care Home Management;Cryotherapy;Electrical Stimulation;Iontophoresis 4mg /ml Dexamethasone;Moist Heat;Ultrasound;Dry needling;Manual techniques;Neuromuscular re-education;Therapeutic activities;Therapeutic exercise    PT Next Visit Plan  review HEP; progress with stretching and stabilization; manual work lumbar spine/Rt hamstrings; modalities as indicated; back care education     Consulted and Agree with Plan of Care  Patient       Patient will benefit from skilled therapeutic intervention in order to improve the following deficits and impairments:  Postural  dysfunction, Improper body mechanics, Pain, Increased fascial restricitons, Increased muscle spasms, Decreased mobility, Decreased range of motion, Decreased activity tolerance  Visit Diagnosis: Acute bilateral low back pain, with sciatica presence unspecified - Plan: PT plan of care cert/re-cert  Pain in right leg - Plan: PT plan of care cert/re-cert  Other symptoms and signs involving the musculoskeletal system - Plan: PT plan of care cert/re-cert     Problem List Patient Active Problem List   Diagnosis Date Noted  . Aortic atherosclerosis ([place]) 03/06/2017  . Encounter for examination of  blood pressure with abnormal findings 04/05/2016  . Orthostatic hypotension 03/24/2016  . 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 Murmur, cardiac 03/24/2016  . Anemia 03/24/2016  . Dizziness 03/24/2016  . 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 Dysfunction of left eustachian tube 02/08/2015  . Vaginitis and vulvovaginitis 01/11/2015  . Chest pain 10/22/2014  . Depression 01/10/2012  . Obesity 12/21/2011  . Iron deficiency anemia 12/15/2011  . 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 Leukocytosis 12/15/2011  . 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 VUR (vesicoureteric reflux) 11/30/2011    Lauri Till Nilda Simmer PT, MPH  03/23/2017, 4:14 PM  [facility] [place], [place], [address] Phone: [phone]   Fax:  [phone]  Name: MAIA HANDA MRN: [id number] Date of Birth: [birth year]

## 2017-03-28 ENCOUNTER — Ambulatory Visit (INDEPENDENT_AMBULATORY_CARE_PROVIDER_SITE_OTHER): Payer: Private Health Insurance - Indemnity

## 2017-03-28 ENCOUNTER — Encounter: Payer: Self-pay | Admitting: Osteopathic Medicine

## 2017-03-28 ENCOUNTER — Ambulatory Visit (INDEPENDENT_AMBULATORY_CARE_PROVIDER_SITE_OTHER): Payer: Private Health Insurance - Indemnity | Admitting: Osteopathic Medicine

## 2017-03-28 VITALS — BP 131/84 | HR 68 | Wt 200.8 lb

## 2017-03-28 DIAGNOSIS — M25561 Pain in right knee: Secondary | ICD-10-CM | POA: Diagnosis not present

## 2017-03-28 DIAGNOSIS — M25461 Effusion, right knee: Secondary | ICD-10-CM

## 2017-03-28 MED ORDER — MELOXICAM 15 MG PO TABS: 15.0000 mg | ORAL_TABLET | Freq: Every day | ORAL | 0 refills | Status: DC

## 2017-03-28 MED ORDER — DICLOFENAC SODIUM 1 % TD GEL: 2.0000 g | Freq: Four times a day (QID) | TRANSDERMAL | 11 refills | Status: DC

## 2017-03-28 NOTE — Progress Notes (Signed)
HPI: Selena May is a 66 y.o. female who  has a past medical history of Iron deficiency anemia (12/15/2011), Leukocytosis (12/15/2011), Pyelonephritis, Renal mass (11/30/2011), Thrombocytosis (? 2/2 Fe Deficiency) (12/15/2011), and VUR (vesicoureteric reflux) (11/30/2011).  she presents to [facility] today, 03/28/17,  for chief complaint of:  Chief Complaint  Patient presents with  . Injury    . Seen by Dr. Georgina Snell 02/26/2017 for low back pain and R hip pain after a fall about a week prior to that visit. Doubtful for fracture based on x-ray, however given myofascial disruption/dysfunction plan for limited Flexeril use at bedtime, heating pad, TENS unit, referral to physical therapy, intermittent Tylenol/NSAID.  Marland Kitchen Reports pain hasn't gotten better. Reports that the knee pain is the most bothersome for her, this was not addressed in great detail at her sports medicine visit due to multitude of other musculoskeletal issues and knee pain was not such a priority for her at that time. She states this has been addressed in physical therapy to some extent but is bothering her. Still having pain lower back and reports R leg/knee soreness she thinks d/t compensating with that leg more, though it was bothering her in the beginning of this issue, too.    Past medical, surgical, social and family history reviewed:  Patient Active Problem List   Diagnosis Date Noted  . Aortic atherosclerosis ([place]) 03/06/2017  . Encounter for examination of blood pressure with abnormal findings 04/05/2016  . Orthostatic hypotension 03/24/2016  . 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 Murmur, cardiac 03/24/2016  . Anemia 03/24/2016  . Dizziness 03/24/2016  . 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 Dysfunction of left eustachian tube 02/08/2015  . Vaginitis and vulvovaginitis 01/11/2015  . Chest  pain 10/22/2014  . Depression 01/10/2012  . Obesity 12/21/2011  . Iron deficiency anemia 12/15/2011  . 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 Leukocytosis 12/15/2011  . 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 VUR (vesicoureteric reflux) 11/30/2011    Past Surgical History:  Procedure Laterality Date  . ABDOMINAL HYSTERECTOMY    . APPENDECTOMY    . CHOLECYSTECTOMY    . GASTRIC BYPASS    . KIDNEY SURGERY    . KNEE SURGERY     left x 2 right x 1  . TONSILLECTOMY      Social History   Tobacco Use  . Smoking status: Never Smoker  . Smokeless tobacco: Never Used  Substance Use Topics  . Alcohol use: No    Family History  Problem Relation Age of Onset  . Alzheimer's disease Mother   . COPD Mother   . Hypertension Mother   . Heart attack Mother   . Cancer Father        Lung  . Heart attack Sister   . Diabetes Sister   . Heart failure Sister   . Heart attack Brother   . Cancer Brother        Lung     Current medication list and allergy/intolerance information reviewed:    Current Outpatient Medications  Medication Sig Dispense Refill  . aspirin EC 325 MG tablet Take 1 tablet (325 mg total) by mouth daily. 30 tablet 0  . atorvastatin (LIPITOR) 40 MG tablet Take 1 tablet (40 mg total) by mouth daily. 90 tablet 3  . cefdinir (OMNICEF) 300 MG capsule Take 300 mg by mouth.    . cyclobenzaprine (FLEXERIL) 5  MG tablet Take 1-2 tablets (5-10 mg total) by mouth at bedtime as needed for muscle spasms. 30 tablet 1  . IRON PO Take by mouth.    . sertraline (ZOLOFT) 50 MG tablet TAKE 1 TABLET BY MOUTH DAILY 90 tablet 0   No current facility-administered medications for this visit.     Allergies  Allergen Reactions  . Ciprofloxacin     seizure  . Sulfa Antibiotics     seizures      Review of Systems:  Constitutional:  No  fever, no chills  HEENT: No  headache  Cardiac: No  chest pain  Respiratory:  No  shortness of breath  Gastrointestinal: No  abdominal  pain  Musculoskeletal: +myalgia/arthralgia, persistent pain as per HPI  Genitourinary: No new incontinence  Skin: No  Rash  Neurologic: No  weakness, No  dizziness   Exam:  BP 131/84   Pulse 68   Wt 200 lb 12.8 oz (91.1 kg)   BMI 37.94 kg/m   Constitutional: VS see above. General Appearance: alert, well-developed, well-nourished, NAD  Neck: No masses, trachea midline  Respiratory: Normal respiratory effort.   Cardiovascular: S1/S2 normal, no murmur, no rub/gallop auscultated. RRR. No lower extremity edema.   Musculoskeletal: Gait favoring the right knee, no significant effusion is palpable to obesity limits exam somewhat, medial pain with McMurray's, normal anterior and posterior drawer  Neurological: Normal balance/coordination. No tremor.  Skin: warm, dry, intact   Psychiatric: Normal judgment/insight. Normal mood and affect.     Dg Knee Complete 4 Views Right  Result Date: 03/28/2017 CLINICAL DATA:  Right knee pain for the past 6 weeks following fall. History of right knee surgery. EXAM: RIGHT KNEE - COMPLETE 4+ VIEW COMPARISON:  None. FINDINGS: No fracture or dislocation. Suspected tiny knee joint effusion. 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 No evidence of chondrocalcinosis. There is minimal spurring the tibial spines. No radiopaque foreign body. IMPRESSION: 1. Tiny knee joint effusion.  Otherwise, no acute findings. 2. 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 Electronically Signed   By: Sandi Mariscal M.D.   On: 03/28/2017 13:46     ASSESSMENT/PLAN:   Right knee pain, unspecified chronicity - Plan: DG Knee Complete 4 Views Right   Meds ordered this encounter  Medications  . diclofenac sodium (VOLTAREN) 1 % GEL    Sig: Apply 2-4 g topically 4 (four) times daily.    Dispense:  10 g    Refill:  11  .  meloxicam (MOBIC) 15 MG tablet    Sig: Take 1 tablet (15 mg total) by mouth daily. Every day for one week, then as needed for pain    Dispense:  30 tablet    Refill:  0     Patient Instructions  Plan:  I suspect possible injury to the cartilage in the knee due to the fall. This may benefit from an injection, would have an appointment with Dr. Georgina Snell in the next 1-2 weeks if the medications and physical therapy are not making much difference. If it is not improving with conservative measures, may need to consider MRI for further evaluation.   For now, continue physical therapy. Will send a medication called Mobic/meloxicam to take once per day for the next week or two, then daily use as needed for pain after that. Can also use the Voltaren gel applied directly to the knee or other painful joints.  Visit summary with medication list and pertinent instructions was printed for patient to review. All questions at time of visit were answered - patient instructed to contact office with any additional concerns. ER/RTC precautions were reviewed with the patient.   Follow-up plan: Return for Recheck knee, back, hip with Dr. Georgina Snell in 1 to 2 weeks.  Note: Total time spent 25 minutes, greater than 50% of the visit was spent face-to-face counseling and coordinating care for the following: The encounter diagnosis was Right knee pain, unspecified chronicity..  Please note: voice recognition software was used to produce this document, and typos may escape review. Please contact Dr. Sheppard Coil for any needed clarifications.

## 2017-03-28 NOTE — Patient Instructions (Signed)
Plan:  I suspect possible injury to the cartilage in the knee due to the fall. This may benefit from an injection, would have an appointment with Dr. Georgina Snell in the next 1-2 weeks if the medications and physical therapy are not making much difference. If it is not improving with conservative measures, may need to consider MRI for further evaluation.   For now, continue physical therapy. Will send a medication called Mobic/meloxicam to take once per day for the next week or two, then daily use as needed for pain after that. Can also use the Voltaren gel applied directly to the knee or other painful joints.

## 2017-03-30 ENCOUNTER — Encounter: Payer: Self-pay | Admitting: Osteopathic Medicine

## 2017-04-04 ENCOUNTER — Encounter: Payer: Self-pay | Admitting: Rehabilitative and Restorative Service Providers"

## 2017-04-04 ENCOUNTER — Ambulatory Visit (INDEPENDENT_AMBULATORY_CARE_PROVIDER_SITE_OTHER): Payer: Private Health Insurance - Indemnity | Admitting: Rehabilitative and Restorative Service Providers"

## 2017-04-04 DIAGNOSIS — M79604 Pain in right leg: Secondary | ICD-10-CM | POA: Diagnosis not present

## 2017-04-04 DIAGNOSIS — R29898 Other symptoms and signs involving the musculoskeletal system: Secondary | ICD-10-CM | POA: Diagnosis not present

## 2017-04-04 DIAGNOSIS — M545 Low back pain: Secondary | ICD-10-CM

## 2017-04-04 NOTE — Patient Instructions (Signed)

## 2017-04-04 NOTE — Therapy (Signed)
[facility]  [place], [place], [address] Phone: [phone]   Fax:  [phone]  Physical Therapy Treatment  Patient Details  Name: Selena May MRN: [id number] Date of Birth: 1950-07-26 Referring Provider: Dr Lynne Leader    Encounter Date: 04/04/2017  PT End of Session - 04/04/17 1405    Visit Number  2    Number of Visits  12    Date for PT Re-Evaluation  05/04/17    PT Start Time  6962    PT Stop Time  1452    PT Time Calculation (min)  53 min    Activity Tolerance  Patient tolerated treatment well       Past Medical History:  Diagnosis Date  . Iron deficiency anemia 12/15/2011  . Leukocytosis 12/15/2011  . Pyelonephritis   . 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 VUR (vesicoureteric reflux) 11/30/2011    Past Surgical History:  Procedure Laterality Date  . ABDOMINAL HYSTERECTOMY    . APPENDECTOMY    . CHOLECYSTECTOMY    . GASTRIC BYPASS    . KIDNEY SURGERY    . KNEE SURGERY     left x 2 right x 1  . TONSILLECTOMY      There were no vitals filed for this visit.  Subjective Assessment - 04/04/17 1403    Subjective  Patient reports that she has been using the TENS unit and can get some relief in symptoms with the TENS = it just doesn't last. The LBP and Rt knee pain are about the same as they have been.  Patient reports that is dealing with kidney infection. Has an appointment this afternoon.     Pain Score  7     Pain Location  Back    Pain Orientation  Mid;Lower    Pain Descriptors / Indicators  Sharp    Pain Type  Acute pain    Pain Radiating Towards  Rt hip into the bend of the knee - inside of the knee     Pain Onset  More than a month ago    Pain Frequency  Constant                      [facility] Adult PT Treatment/Exercise - 04/04/17 0001      Lumbar Exercises: Stretches   Passive Hamstring Stretch  3 reps;30 seconds supine with  strap     Quad Stretch  3 reps;30 seconds prone with strap     ITB Stretch  2 reps;30 seconds supine with strap     ITB Stretch Limitations  adductor stretch with strap 30 sec x 2       Lumbar Exercises: Aerobic   Stationary Bike  Nustep - L5 x 2 min - increased pain       Lumbar Exercises: Supine   Other Supine Lumbar Exercises  3 part core 10 sec x 10       Moist Heat Therapy   Number Minutes Moist Heat  20 Minutes    Moist Heat Location  Lumbar Spine;Knee Rt knee       Electrical Stimulation   Electrical Stimulation Location  bilat sacral area; Rt knee     Electrical Stimulation Action  HVGS    Electrical Stimulation Parameters  to tolerance    Electrical Stimulation Goals  Pain;Tone      Manual Therapy   Manual therapy comments  pt prone  Soft tissue mobilization  deep tissue work through the LB/ bilat posterior hips; Rt HS into posterior knee and gastroc              PT Education - 04/04/17 1429    Education provided  Yes    Education Details  DN    Person(s) Educated  Patient    Methods  Explanation    Comprehension  Verbalized understanding          PT Long Term Goals - 04/04/17 1407      PT LONG TERM GOAL #1   Title  Improve trunk and LE mobilty and ROM with patient to demonstrate ROM WFL's and pain free throughout to facilitate return to normal functional activities 05/04/17    Time  6    Period  Weeks    Status  On-going      PT LONG TERM GOAL #2   Title  Patient to report decresaed pain by 75-80% to sit; stand; walk for functional times of 0-15 min without pain 05/04/17    Time  6    Period  Weeks    Status  On-going      PT LONG TERM GOAL #3   Title  Functional strength to allow patient to return to normal functional activities without limitation 05/04/17    Time  6    Period  Weeks    Status  On-going      PT LONG TERM GOAL #4   Title  Independent in HEP 05/04/17    Time  6    Period  Weeks    Status  On-going      PT LONG TERM GOAL #5    Title  Improve FOTO to </= 44% limitation 05/04/17    Time  6    Period  Weeks    Status  On-going            Plan - 04/04/17 1405    Clinical Impression Statement  Angelette continues to have LBP and Rt knee pain following a fall 02/22/17 with persistent LBP/sacral/coccyx pain and Rt LE pain. She has difficulty tolerating exercise due to pain.     Rehab Potential  Good    PT Frequency  2x / week    PT Duration  6 weeks    PT Treatment/Interventions  Patient/family education;ADLs/Self Care Home Management;Cryotherapy;Electrical Stimulation;Iontophoresis 4mg /ml Dexamethasone;Moist Heat;Ultrasound;Dry needling;Manual techniques;Neuromuscular re-education;Therapeutic activities;Therapeutic exercise    PT Next Visit Plan  review HEP; progress with stretching and stabilization; manual work lumbar spine/Rt hamstrings; modalities as indicated; back care education     Consulted and Agree with Plan of Care  Patient       Patient will benefit from skilled therapeutic intervention in order to improve the following deficits and impairments:  Postural dysfunction, Improper body mechanics, Pain, Increased fascial restricitons, Increased muscle spasms, Decreased mobility, Decreased range of motion, Decreased activity tolerance  Visit Diagnosis: Acute bilateral low back pain, with sciatica presence unspecified  Pain in right leg  Other symptoms and signs involving the musculoskeletal system     Problem List Patient Active Problem List   Diagnosis Date Noted  . Aortic atherosclerosis ([place]) 03/06/2017  . Encounter for examination of blood pressure with abnormal findings 04/05/2016  . Orthostatic hypotension 03/24/2016  . 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 Murmur, cardiac 03/24/2016  . Anemia 03/24/2016  . Dizziness 03/24/2016  . 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  Dysfunction of left eustachian  tube 02/08/2015  . Vaginitis and vulvovaginitis 01/11/2015  . Chest pain 10/22/2014  . Depression 01/10/2012  . Obesity 12/21/2011  . Iron deficiency anemia 12/15/2011  . 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 Leukocytosis 12/15/2011  . 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 VUR (vesicoureteric reflux) 11/30/2011    Aison Malveaux Nilda Simmer PT, MPH  04/04/2017, 2:34 PM  [facility] [place], [place], [address] Phone: [phone]   Fax:  [phone]  Name: Selena May MRN: [id number] Date of Birth: 18-Apr-1950

## 2017-04-06 ENCOUNTER — Ambulatory Visit (INDEPENDENT_AMBULATORY_CARE_PROVIDER_SITE_OTHER): Payer: Private Health Insurance - Indemnity | Admitting: Rehabilitative and Restorative Service Providers"

## 2017-04-06 ENCOUNTER — Encounter: Payer: Self-pay | Admitting: Rehabilitative and Restorative Service Providers"

## 2017-04-06 DIAGNOSIS — M79604 Pain in right leg: Secondary | ICD-10-CM | POA: Diagnosis not present

## 2017-04-06 DIAGNOSIS — R29898 Other symptoms and signs involving the musculoskeletal system: Secondary | ICD-10-CM | POA: Diagnosis not present

## 2017-04-06 DIAGNOSIS — M545 Low back pain: Secondary | ICD-10-CM

## 2017-04-06 NOTE — Patient Instructions (Addendum)
  Achilles / Gastroc, Standing    Stand, right foot behind, heel on floor and turned slightly out, leg straight, forward leg bent. Move hips forward. Hold _30__ seconds. Repeat _3__ times per session. Do __2-3_ sessions per day.    Achilles / Soleus, Standing    Stand, right foot behind, heel on floor and turned slightly out. Lower hips and bend knees. Hold __30_ seconds. Repeat __3_ times per session. Do _2-3__ sessions per day.  Gluteal Sets    Tighten buttocks while pressing pelvis to floor. Hold __10__ seconds. Repeat _10___ times per set. Do __1-2__ sets per session. Do _1-2___ sessions per day.   Tighten muscles in the buttocks, toe resting on surface,  Lift knee keeping toe on the surface, pause, slowly lower  Switch legs to opposite side. Alternate legs for 10 reps    Pelvic Press    Place hands under belly between navel and pubic bone, palms up. Feel pressure on hands. Increase pressure on hands by pressing pelvis down. This is NOT a pelvic tilt. Hold _5__ seconds. Relax. Repeat __10_ times.    Massage to tailbone

## 2017-04-06 NOTE — Therapy (Signed)
[place], [place], [address] Phone: [phone]   Fax:  [phone]  Physical Therapy Treatment  Patient Details  Name: Selena May MRN: [id number] Date of Birth: February 16, 1951 Referring Provider: Dr Lynne Leader    Encounter Date: 04/06/2017  PT End of Session - 04/06/17 1402    Visit Number  3    Number of Visits  12    Date for PT Re-Evaluation  05/04/17    PT Start Time  1400    PT Stop Time  1457    PT Time Calculation (min)  57 min    Activity Tolerance  Patient tolerated treatment well       Past Medical History:  Diagnosis Date  . Iron deficiency anemia 12/15/2011  . Leukocytosis 12/15/2011  . Pyelonephritis   . 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 VUR (vesicoureteric reflux) 11/30/2011    Past Surgical History:  Procedure Laterality Date  . ABDOMINAL HYSTERECTOMY    . APPENDECTOMY    . CHOLECYSTECTOMY    . GASTRIC BYPASS    . KIDNEY SURGERY    . KNEE SURGERY     left x 2 right x 1  . TONSILLECTOMY      There were no vitals filed for this visit.  Subjective Assessment - 04/06/17 1408    Subjective  Feelin g some better - used the TENS unit more yesterday and that helps. Can really feel the stretches today.     Currently in Pain?  Yes    Pain Score  7     Pain Location  Back    Pain Orientation  Mid;Lower    Pain Descriptors / Indicators  Sharp    Pain Type  Acute pain    Pain Radiating Towards  Rt hip to the bend of the knee; inside of the knee     Pain Onset  More than a month ago    Pain Frequency  Constant                      [facility] Adult PT Treatment/Exercise - 04/06/17 0001      Lumbar Exercises: Stretches   Passive Hamstring Stretch  3 reps;30 seconds supine with strap     Quad Stretch  3 reps;30 seconds prone with strap     ITB Stretch  2 reps;30 seconds supine with strap     ITB Stretch Limitations   adductor stretch with strap 30 sec x 2       Lumbar Exercises: Aerobic   Stationary Bike  painful      Lumbar Exercises: Supine   Other Supine Lumbar Exercises  3 part core 10 sec x 10       Knee/Hip Exercises: Stretches   Gastroc Stretch  3 reps;30 seconds Rt LE in shoes with ~ 1.5 inbch heel     Soleus Stretch  3 reps;30 seconds;Right in shoes with 1.5 in heel       Moist Heat Therapy   Number Minutes Moist Heat  20 Minutes    Moist Heat Location  Lumbar Spine;Knee Rt knee       Electrical Stimulation   Electrical Stimulation Location  bilat sacral area; Rt knee     Electrical Stimulation Action  HVGS    Electrical Stimulation Parameters  to toerance    Electrical Stimulation Goals  Pain;Tone      Manual Therapy  Manual therapy comments  pt prone     Soft tissue mobilization  deep tissue work through the LB/ bilat posterior hips; Rt HS into posterior knee and gastroc     Myofascial Release  working Rt > Lt lateral coccyx area with gentle stretch through the gluteal fold              PT Education - 04/06/17 1439    Education provided  Yes    Education Details  HEP     Person(s) Educated  Patient    Methods  Explanation;Demonstration;Tactile cues;Verbal cues;Handout    Comprehension  Verbalized understanding;Returned demonstration;Verbal cues required;Tactile cues required          PT Long Term Goals - 04/04/17 1407      PT LONG TERM GOAL #1   Title  Improve trunk and LE mobilty and ROM with patient to demonstrate ROM WFL's and pain free throughout to facilitate return to normal functional activities 05/04/17    Time  6    Period  Weeks    Status  On-going      PT LONG TERM GOAL #2   Title  Patient to report decresaed pain by 75-80% to sit; stand; walk for functional times of 0-15 min without pain 05/04/17    Time  6    Period  Weeks    Status  On-going      PT LONG TERM GOAL #3   Title  Functional strength to allow patient to return to normal functional  activities without limitation 05/04/17    Time  6    Period  Weeks    Status  On-going      PT LONG TERM GOAL #4   Title  Independent in HEP 05/04/17    Time  6    Period  Weeks    Status  On-going      PT LONG TERM GOAL #5   Title  Improve FOTO to </= 44% limitation 05/04/17    Time  6    Period  Weeks    Status  On-going            Plan - 04/06/17 1404    Clinical Impression Statement  Some improvement in LBP and Rt knee pain. She has been using the TENS unit more frequently at home. She continues to have LBP and Rt knee pain following fall 02/22/17. Pain persists in LB, sacrum, and coccyx. Good release of muscular tightness with work through coccyx area.     Rehab Potential  Good    PT Frequency  2x / week    PT Duration  6 weeks    PT Treatment/Interventions  Patient/family education;ADLs/Self Care Home Management;Cryotherapy;Electrical Stimulation;Iontophoresis 4mg /ml Dexamethasone;Moist Heat;Ultrasound;Dry needling;Manual techniques;Neuromuscular re-education;Therapeutic activities;Therapeutic exercise    PT Next Visit Plan  review HEP; progress with stretching and stabilization; manual work lumbar spine/Rt hamstrings; modalities as indicated; back care education - continue work through the coccyx area as tolerated     Consulted and Agree with Plan of Care  Patient       Patient will benefit from skilled therapeutic intervention in order to improve the following deficits and impairments:  Postural dysfunction, Improper body mechanics, Pain, Increased fascial restricitons, Increased muscle spasms, Decreased mobility, Decreased range of motion, Decreased activity tolerance  Visit Diagnosis: Acute bilateral low back pain, with sciatica presence unspecified  Pain in right leg  Other symptoms and signs involving the musculoskeletal system     Problem List Patient Active Problem  List   Diagnosis Date Noted  . Aortic atherosclerosis ([place]) 03/06/2017  . Encounter for  examination of blood pressure with abnormal findings 04/05/2016  . Orthostatic hypotension 03/24/2016  . 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 Murmur, cardiac 03/24/2016  . Anemia 03/24/2016  . Dizziness 03/24/2016  . 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 Dysfunction of left eustachian tube 02/08/2015  . Vaginitis and vulvovaginitis 01/11/2015  . Chest pain 10/22/2014  . Depression 01/10/2012  . Obesity 12/21/2011  . Iron deficiency anemia 12/15/2011  . 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 Leukocytosis 12/15/2011  . 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 VUR (vesicoureteric reflux) 11/30/2011    Kelen Laura Nilda Simmer PT, MPH  04/06/2017, 2:42 PM  [facility] [place], [place], [address] Phone: [phone]   Fax:  [phone]  Name: Selena May MRN: [id number] Date of Birth: [birth year]

## 2017-04-11 ENCOUNTER — Encounter: Payer: Self-pay | Admitting: Family Medicine

## 2017-04-11 ENCOUNTER — Encounter: Payer: Self-pay | Admitting: Rehabilitative and Restorative Service Providers"

## 2017-04-11 ENCOUNTER — Ambulatory Visit (INDEPENDENT_AMBULATORY_CARE_PROVIDER_SITE_OTHER): Payer: Private Health Insurance - Indemnity | Admitting: Rehabilitative and Restorative Service Providers"

## 2017-04-11 ENCOUNTER — Ambulatory Visit (INDEPENDENT_AMBULATORY_CARE_PROVIDER_SITE_OTHER): Payer: Private Health Insurance - Indemnity | Admitting: Family Medicine

## 2017-04-11 VITALS — BP 145/77 | HR 69 | Wt 201.0 lb

## 2017-04-11 DIAGNOSIS — M25561 Pain in right knee: Secondary | ICD-10-CM

## 2017-04-11 DIAGNOSIS — R29898 Other symptoms and signs involving the musculoskeletal system: Secondary | ICD-10-CM | POA: Diagnosis not present

## 2017-04-11 DIAGNOSIS — M545 Low back pain: Secondary | ICD-10-CM | POA: Diagnosis not present

## 2017-04-11 DIAGNOSIS — M79604 Pain in right leg: Secondary | ICD-10-CM

## 2017-04-11 NOTE — Patient Instructions (Signed)
Thank you for coming in today. Call or go to the ER if you develop a large red swollen joint with extreme pain or oozing puss.  Recheck in 6 weeks or so.  Return sooner if needed.  Next step for knee would be MRI.  Let me know if not doing well.  You can also try using over the counter aspercream    Meniscus Tear A meniscus tear is a knee injury in which a piece of the meniscus is torn. The meniscus is a thick, rubbery, wedge-shaped cartilage in the knee. Two menisci are located in each knee. They sit between the upper bone (femur) and lower bone (tibia) that make up the knee joint. Each meniscus acts as a shock absorber for the knee. A torn meniscus is one of the most common types of knee injuries. This injury can range from mild to severe. Surgery may be needed for a severe tear. What are the causes? This injury may be caused by any squatting, twisting, or pivoting movement. Sports-related injuries are the most common cause. These often occur from:  Running and stopping suddenly.  Changing direction.  Being tackled or knocked off your feet.  As people get older, their meniscus gets thinner and weaker. In these people, tears can happen more easily, such as from climbing stairs. What increases the risk? This injury is more likely to happen to:  People who play contact sports.  Males.  People who are 20?66 years of age.  What are the signs or symptoms? Symptoms of this injury include:  Knee pain, especially at the side of the knee joint. You may feel pain when the injury occurs, or you may only hear a pop and feel pain later.  A feeling that your knee is clicking, catching, locking, or giving way.  Not being able to fully bend or extend your knee.  Bruising or swelling in your knee.  How is this diagnosed? This injury may be diagnosed based on your symptoms and a physical exam. The physical exam may include:  Moving your knee in different ways.  Feeling for  tenderness.  Listening for a clicking sound.  Checking if your knee locks or catches.  You may also have tests, such as:  X-rays.  MRI.  A procedure to look inside your knee with a narrow surgical telescope (arthroscopy).  You may be referred to a knee specialist (orthopedic surgeon). How is this treated? Treatment for this injury depends on the severity of the tear. Treatment for a mild tear may include:  Rest.  Medicine to reduce pain and swelling. This is usually a nonsteroidal anti-inflammatory drug (NSAID).  A knee brace or an elastic sleeve or wrap.  Using crutches or a walker to keep weight off your knee and to help you walk.  Exercises to strengthen your knee (physical therapy).  You may need surgery if you have a severe tear or if other treatments are not working. Follow these instructions at home: Managing pain and swelling  Take over-the-counter and prescription medicines only as told by your health care provider.  If directed, apply ice to the injured area: ? Put ice in a plastic bag. ? Place a towel between your skin and the bag. ? Leave the ice on for 20 minutes, 2-3 times per day.  Raise (elevate) the injured area above the level of your heart while you are sitting or lying down. Activity  Do not use the injured limb to support your body weight until your  health care provider says that you can. Use crutches or a walker as told by your health care provider.  Return to your normal activities as told by your health care provider. Ask your health care provider what activities are safe for you.  Perform range-of-motion exercises only as told by your health care provider.  Begin doing exercises to strengthen your knee and leg muscles only as told by your health care provider. After you recover, your health care provider may recommend these exercises to help prevent another injury. General instructions  Use a knee brace or elastic wrap as told by your health  care provider.  Keep all follow-up visits as told by your health care provider. This is important. Contact a health care provider if:  You have a fever.  Your knee becomes red, tender, or swollen.  Your pain medicine is not helping.  Your symptoms get worse or do not improve after 2 weeks of home care. This information is not intended to replace advice given to you by your health care provider. Make sure you discuss any questions you have with your health care provider. Document Released: 06/10/2002 Document Revised: 08/26/2015 Document Reviewed: 07/13/2014 Elsevier Interactive Patient Education  [employer].

## 2017-04-11 NOTE — Therapy (Signed)
[place], [place], [address] Phone: [phone]   Fax:  [phone]  Physical Therapy Treatment  Patient Details  Name: Selena May MRN: [id number] Date of Birth: 08/31/1950 Referring Provider: Dr Lynne Leader    Encounter Date: 04/11/2017  PT End of Session - 04/11/17 1435    Visit Number  4    Number of Visits  12    Date for PT Re-Evaluation  05/04/17    PT Start Time  0109    PT Stop Time  1449    PT Time Calculation (min)  47 min    Activity Tolerance  Patient tolerated treatment well       Past Medical History:  Diagnosis Date  . Iron deficiency anemia 12/15/2011  . Leukocytosis 12/15/2011  . Pyelonephritis   . 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 VUR (vesicoureteric reflux) 11/30/2011    Past Surgical History:  Procedure Laterality Date  . ABDOMINAL HYSTERECTOMY    . APPENDECTOMY    . CHOLECYSTECTOMY    . GASTRIC BYPASS    . KIDNEY SURGERY    . KNEE SURGERY     left x 2 right x 1  . TONSILLECTOMY      There were no vitals filed for this visit.  Subjective Assessment - 04/11/17 1409    Subjective  Just had an injection in the Rt knee today and "it is killing me". The manual wark at last visit seemed to help but she is having pain from the kidney infection.     Currently in Pain?  Yes    Pain Score  9  5/10 at tailbone thinks the majority of the LBP is kidney related     Pain Location  Back    Pain Orientation  Right;Mid;Lower    Pain Descriptors / Indicators  Sharp                      [facility] Adult PT Treatment/Exercise - 04/11/17 0001      Moist Heat Therapy   Number Minutes Moist Heat  20 Minutes    Moist Heat Location  Lumbar Spine;Knee Rt knee       Electrical Stimulation   Electrical Stimulation Location  bilat sacral area; bilat posterior hips/piriformis     Electrical Stimulation Action  IFC    Electrical  Stimulation Parameters  to tolerance    Electrical Stimulation Goals  Pain;Tone      Manual Therapy   Manual therapy comments  pt prone     Soft tissue mobilization  deep tissue work through the LB/ bilat posterior hips; coccyx, tailbone(tighter Rt than Lt     Myofascial Release  working Rt > Lt lateral coccyx area with gentle stretch through the gluteal fold                   PT Long Term Goals - 04/11/17 1432      PT LONG TERM GOAL #1   Title  Improve trunk and LE mobilty and ROM with patient to demonstrate ROM WFL's and pain free throughout to facilitate return to normal functional activities 05/04/17    Time  6    Period  Weeks    Status  On-going      PT LONG TERM GOAL #2   Title  Patient to report decresaed pain by 75-80% to sit; stand; walk for functional times of 0-15  min without pain 05/04/17    Time  6    Period  Weeks    Status  On-going      PT LONG TERM GOAL #3   Title  Functional strength to allow patient to return to normal functional activities without limitation 05/04/17    Time  6    Period  Weeks    Status  On-going      PT LONG TERM GOAL #4   Title  Independent in HEP 05/04/17    Time  6    Period  Weeks    Status  On-going      PT LONG TERM GOAL #5   Title  Improve FOTO to </= 44% limitation 05/04/17    Time  6    Period  Weeks    Status  On-going            Plan - 04/11/17 1431    Clinical Impression Statement  Improved pain through the LB in area of the coccyx. Positive response to manual work through coccyx and lumbar spine. Pain continues from kidney infectioin which patient has had in the past. Increased pain in the Rt knee following injection today. 30 min appt - focus on manual work.     Rehab Potential  Good    PT Frequency  2x / week    PT Duration  6 weeks    PT Treatment/Interventions  Patient/family education;ADLs/Self Care Home Management;Cryotherapy;Electrical Stimulation;Iontophoresis 4mg /ml Dexamethasone;Moist  Heat;Ultrasound;Dry needling;Manual techniques;Neuromuscular re-education;Therapeutic activities;Therapeutic exercise    PT Next Visit Plan  review HEP; progress with stretching and stabilization; manual work lumbar spine/Rt hamstrings; modalities as indicated; back care education - continue work through the coccyx area as tolerated     Consulted and Agree with Plan of Care  Patient       Patient will benefit from skilled therapeutic intervention in order to improve the following deficits and impairments:  Postural dysfunction, Improper body mechanics, Pain, Increased fascial restricitons, Increased muscle spasms, Decreased mobility, Decreased range of motion, Decreased activity tolerance  Visit Diagnosis: Acute bilateral low back pain, with sciatica presence unspecified  Pain in right leg  Other symptoms and signs involving the musculoskeletal system     Problem List Patient Active Problem List   Diagnosis Date Noted  . Aortic atherosclerosis ([place]) 03/06/2017  . Encounter for examination of blood pressure with abnormal findings 04/05/2016  . Orthostatic hypotension 03/24/2016  . 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 Murmur, cardiac 03/24/2016  . Anemia 03/24/2016  . Dizziness 03/24/2016  . 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 Dysfunction of left eustachian tube 02/08/2015  . Vaginitis and vulvovaginitis 01/11/2015  . Chest pain 10/22/2014  . Depression 01/10/2012  . Obesity 12/21/2011  . Iron deficiency anemia 12/15/2011  . 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 Leukocytosis 12/15/2011  . 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 VUR (vesicoureteric reflux) 11/30/2011    Jadavion Spoelstra Nilda Simmer PT, MPH  04/11/2017, 2:35 PM  [facility] [place], [place], [address] Phone: [phone]   Fax:  [phone]  Name: Selena May MRN: [id number] Date of Birth: Dec 20, 1950

## 2017-04-11 NOTE — Progress Notes (Signed)
Selena May is a 67 y.o. female who presents to [facility] today for back and knee pain.    [name] has been seen several times for back and knee pain. She has been attending physical therapy for her back which has improved.  She still somewhat symptomatic but improving.  However she notes continued right knee pain.  The pain is located medially and occurred after an injury several weeks ago.  She is been doing some physical therapy but does not note significant benefit.  She is tried over-the-counter medicines which have not helped much.  X-rays were initially unremarkable with the exception of mild DJD.  Past Medical History:  Diagnosis Date  . Iron deficiency anemia 12/15/2011  . Leukocytosis 12/15/2011  . Pyelonephritis   . 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 VUR (vesicoureteric reflux) 11/30/2011   Past Surgical History:  Procedure Laterality Date  . ABDOMINAL HYSTERECTOMY    . APPENDECTOMY    . CHOLECYSTECTOMY    . GASTRIC BYPASS    . KIDNEY SURGERY    . KNEE SURGERY     left x 2 right x 1  . TONSILLECTOMY     Social History   Tobacco Use  . Smoking status: Never Smoker  . Smokeless tobacco: Never Used  Substance Use Topics  . Alcohol use: No     ROS:  As above   Medications: Current Outpatient Medications  Medication Sig Dispense Refill  . cefdinir (OMNICEF) 300 MG capsule Take 300 mg by mouth.    . cyclobenzaprine (FLEXERIL) 5 MG tablet Take 1-2 tablets (5-10 mg total) by mouth at bedtime as needed for muscle spasms. 30 tablet 1  . IRON PO Take by mouth.    . meloxicam (MOBIC) 15 MG tablet Take 1 tablet (15 mg total) by mouth daily. Every day for one week, then as needed for pain 30 tablet 0  . sertraline (ZOLOFT) 50 MG tablet TAKE 1 TABLET BY MOUTH DAILY 90 tablet 0   No current facility-administered medications for this visit.    Allergies  Allergen Reactions  .  Ciprofloxacin     seizure  . Sulfa Antibiotics     seizures     Exam:  BP (!) 145/77   Pulse 69   Wt 201 lb (91.2 kg)   BMI 37.98 kg/m  General: Well Developed, well nourished, and in no acute distress.  Neuro/Psych: Alert and oriented x3, extra-ocular muscles intact, able to move all 4 extremities, sensation grossly intact. Skin: Warm and dry, no rashes noted.  Respiratory: Not using accessory muscles, speaking in full sentences, trachea midline.  Cardiovascular: Pulses palpable, no extremity edema. Abdomen: Does not appear distended. MSK:  Right knee no erythema mild effusion. Tender to palpation medial joint line. Range of motion 0-120 degrees. Stable ligamentous exam. Mildly positive medial McMurray's test.  .Procedure: Real-time Ultrasound Guided Injection of right knee  Device: GE Logiq E  Images permanently stored and available for review in the ultrasound unit. Verbal informed consent obtained. Discussed risks and benefits of procedure. Warned about infection bleeding damage to structures skin hypopigmentation and fat atrophy among others. Patient expresses understanding and agreement Time-out conducted.  Noted no overlying erythema, induration, or other signs of local infection.  Skin prepped in a sterile fashion.  Local anesthesia: Topical Ethyl chloride.  With sterile technique and under real time ultrasound guidance: 80mg  kenalog and 51ml marcaine injected easily.  Completed without  difficulty  Pain immediately resolved suggesting accurate placement of the medication.  Advised to call if fevers/chills, erythema, induration, drainage, or persistent bleeding.  Images permanently stored and available for review in the ultrasound unit.  Impression: Technically successful ultrasound guided injection.    CLINICAL DATA:  Right knee pain for the past 6 weeks following fall. History of right knee surgery.  EXAM: RIGHT KNEE - COMPLETE 4+  VIEW  COMPARISON:  None.  FINDINGS: No fracture or dislocation. Suspected tiny knee joint effusion. 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 No evidence of chondrocalcinosis. There is minimal spurring the tibial spines. No radiopaque foreign body.  IMPRESSION: 1. Tiny knee joint effusion.  Otherwise, no acute findings. 2. 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   Electronically Signed   By: Sandi Mariscal M.D.   On: 03/28/2017 13:46    No results found for this or any previous visit (from the past 48 hour(s)). No results found.    Assessment and Plan: 67 y.o. female with right knee pain concerning for degenerative meniscus tear versus exacerbation of arthritis following injury.  We discussed options.  Plan for trial of steroid knee injection and physical therapy.  If not better next steps would be MRI for surgical planning.    No orders of the defined types were placed in this encounter.  No orders of the defined types were placed in this encounter.   Discussed warning signs or symptoms. Please see discharge instructions. Patient expresses understanding.  I spent 25 minutes with this patient, greater than 50% was face-to-face time counseling regarding ddx and treatment plan.

## 2017-04-13 ENCOUNTER — Encounter: Payer: Self-pay | Admitting: Physical Therapy

## 2017-04-13 ENCOUNTER — Ambulatory Visit (INDEPENDENT_AMBULATORY_CARE_PROVIDER_SITE_OTHER): Payer: Private Health Insurance - Indemnity | Admitting: Physical Therapy

## 2017-04-13 DIAGNOSIS — M79604 Pain in right leg: Secondary | ICD-10-CM | POA: Diagnosis not present

## 2017-04-13 DIAGNOSIS — M545 Low back pain: Secondary | ICD-10-CM

## 2017-04-13 DIAGNOSIS — R29898 Other symptoms and signs involving the musculoskeletal system: Secondary | ICD-10-CM

## 2017-04-13 NOTE — Patient Instructions (Signed)

## 2017-04-13 NOTE — Therapy (Signed)
[place], [place], [address] Phone: [phone]   Fax:  [phone]  Physical Therapy Treatment  Patient Details  Name: Selena May MRN: [id number] Date of Birth: July 18, 1950 Referring Provider: Dr. Lynne Leader   Encounter Date: 04/13/2017  PT End of Session - 04/13/17 1409    Visit Number  5    Number of Visits  12    Date for PT Re-Evaluation  05/04/17    PT Start Time  9242    PT Stop Time  1503    PT Time Calculation (min)  59 min    Behavior During Therapy  [facility] for tasks assessed/performed       Past Medical History:  Diagnosis Date  . Iron deficiency anemia 12/15/2011  . Leukocytosis 12/15/2011  . Pyelonephritis   . 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 VUR (vesicoureteric reflux) 11/30/2011    Past Surgical History:  Procedure Laterality Date  . ABDOMINAL HYSTERECTOMY    . APPENDECTOMY    . CHOLECYSTECTOMY    . GASTRIC BYPASS    . KIDNEY SURGERY    . KNEE SURGERY     left x 2 right x 1  . TONSILLECTOMY      There were no vitals filed for this visit.  Subjective Assessment - 04/13/17 1409    Subjective  Pt reports she had a little relief from knee injection, "but then last night, it came back full force. "  She is taking medicine for 3.5 more days for kidney infection.     Patient Stated Goals  get rid of the back pain     Currently in Pain?  Yes    Pain Score  8     Pain Location  Back    Pain Orientation  Lower    Aggravating Factors   prolonged positions, moving    Pain Relieving Factors  heat, meds, massage    Multiple Pain Sites  Yes    Pain Score  5    Pain Location  Knee    Pain Orientation  Right    Aggravating Factors   bending knee, stairs.     Pain Relieving Factors  TENS          [facility] PT Assessment - 04/13/17 0001      Assessment   Medical Diagnosis  LBP     Referring Provider  Dr. Lynne Leader    Onset  Date/Surgical Date  02/22/17    Hand Dominance  Right    Next MD Visit  03/28/17    Prior Therapy  none for back       Palpation   Palpation comment  Rt ASIS lower than Lt; level sacrum in prone,  Rt PSIS higher than Lt.  Rt ilium higher than Lt.          [place] Adult PT Treatment/Exercise - 04/13/17 0001      Self-Care   Self-Care  Other Self-Care Comments    Other Self-Care Comments   pt educated on  MFR and self massage with ball to low back (espeically Rt QL), ant/post hip.  Demo provided, pt verbalized understanding.       Lumbar Exercises: Stretches   Passive Hamstring Stretch  Right;Left;4 reps;30 seconds 2 in sitting, 2 in supine    ITB Stretch Limitations  adductor stretch in supine, with strap - 30 sec x 3 reps each side.  Lumbar Exercises: Standing   Other Standing Lumbar Exercises  sit to/from stand with core engaged x 5 reps       Lumbar Exercises: Supine   Other Supine Lumbar Exercises  bridge isometric with arm press down (no lifting of hips) with 5 sec hold x 15 reps      Moist Heat Therapy   Number Minutes Moist Heat  20 Minutes    Moist Heat Location  Lumbar Spine;Knee Rt knee       Electrical Stimulation   Electrical Stimulation Location  bilat sacral and coccyx area     Electrical Stimulation Action  IFC    Electrical Stimulation Parameters   to tolerance     Electrical Stimulation Goals  Pain      Manual Therapy   Manual Therapy  Muscle Energy Technique;Taping    Manual therapy comments  pt very point tender with palpation to ASIS and PSIS.    2- I strips of black Rock tape applied to Rt medial knee to decompress area, decrease pain and increase proprioception.     Soft tissue mobilization  STM to Rt glute and low back.     Muscle Energy Technique  MET correction for elevated ilium in Lt sidelying with contract relax of hip abdct, 4 reps; MET to correct ant rotated Rt ilium with contract relax of Rt hamstring with pt in supine x 4 reps. Bridge in  between corrections ot reset hips.              PT Education - 04/13/17 1503    Education provided  Yes    Education Details  self massage; kinesiotape     Person(s) Educated  Patient    Methods  Explanation;Handout;Demonstration    Comprehension  Verbalized understanding          PT Long Term Goals - 04/11/17 1432      PT LONG TERM GOAL #1   Title  Improve trunk and LE mobilty and ROM with patient to demonstrate ROM WFL's and pain free throughout to facilitate return to normal functional activities 05/04/17    Time  6    Period  Weeks    Status  On-going      PT LONG TERM GOAL #2   Title  Patient to report decresaed pain by 75-80% to sit; stand; walk for functional times of 0-15 min without pain 05/04/17    Time  6    Period  Weeks    Status  On-going      PT LONG TERM GOAL #3   Title  Functional strength to allow patient to return to normal functional activities without limitation 05/04/17    Time  6    Period  Weeks    Status  On-going      PT LONG TERM GOAL #4   Title  Independent in HEP 05/04/17    Time  6    Period  Weeks    Status  On-going      PT LONG TERM GOAL #5   Title  Improve FOTO to </= 44% limitation 05/04/17    Time  6    Period  Weeks    Status  On-going            Plan - 04/13/17 1503    Clinical Impression Statement  Pt continues to have pain in Rt knee and low back; also recovering from kidney infection.  Pt reported relief in back with MET correction for elevated Rt  ilium.  Further relief with use of estim/MHP at end of session. Pt making gradual gains towards therapy goals.     Rehab Potential  Good    PT Frequency  2x / week    PT Duration  6 weeks    PT Treatment/Interventions  Patient/family education;ADLs/Self Care Home Management;Cryotherapy;Electrical Stimulation;Iontophoresis 59m/ml Dexamethasone;Moist Heat;Ultrasound;Dry needling;Manual techniques;Neuromuscular re-education;Therapeutic activities;Therapeutic exercise    PT Next  Visit Plan  assess pelvis alignment, continue stretching and core stabilization.  continue manual work through low back and coccyx.  Assess response to rock tape on knee.   Consulted and Agree with Plan of Care  Patient       Patient will benefit from skilled therapeutic intervention in order to improve the following deficits and impairments:  Postural dysfunction, Improper body mechanics, Pain, Increased fascial restricitons, Increased muscle spasms, Decreased mobility, Decreased range of motion, Decreased activity tolerance  Visit Diagnosis: Acute bilateral low back pain, with sciatica presence unspecified  Pain in right leg  Other symptoms and signs involving the musculoskeletal system     Problem List Patient Active Problem List   Diagnosis Date Noted  . Aortic atherosclerosis ([place] 03/06/2017  . Encounter for examination of blood pressure with abnormal findings 04/05/2016  . Orthostatic hypotension 03/24/2016  . 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 Murmur, cardiac 03/24/2016  . Anemia 03/24/2016  . Dizziness 03/24/2016  . 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 Pituitary mass ([place] 08/16/2015  . Dysfunction of left eustachian tube 02/08/2015  . Vaginitis and vulvovaginitis 01/11/2015  . Chest pain 10/22/2014  . Depression 01/10/2012  . Obesity 12/21/2011  . Iron deficiency anemia 12/15/2011  . 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 Leukocytosis 12/15/2011  . 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 VUR (vesicoureteric reflux) 11/30/2011   JKerin Perna PTA 04/13/17 3:11 PM  [facility] [address] [place] [address]Phone: [phone]  Fax:  [phone] Name: Selena ATHAMRN: [id number] of Birth: [birth year]

## 2017-04-18 ENCOUNTER — Ambulatory Visit (INDEPENDENT_AMBULATORY_CARE_PROVIDER_SITE_OTHER): Payer: Private Health Insurance - Indemnity | Admitting: Physical Therapy

## 2017-04-18 DIAGNOSIS — M79604 Pain in right leg: Secondary | ICD-10-CM | POA: Diagnosis not present

## 2017-04-18 DIAGNOSIS — R29898 Other symptoms and signs involving the musculoskeletal system: Secondary | ICD-10-CM | POA: Diagnosis not present

## 2017-04-18 DIAGNOSIS — M545 Low back pain: Secondary | ICD-10-CM | POA: Diagnosis not present

## 2017-04-18 NOTE — Therapy (Signed)
Selena May, [place], [address] Phone: [phone]   Fax:  [phone]  Physical Therapy Treatment  Patient Details  Name: Selena May MRN: [id number] Date of Birth: Jan 26, 1951 Referring Provider: Dr. Lynne Leader   Encounter Date: 04/18/2017  PT End of Session - 04/18/17 1439    Visit Number  6    Number of Visits  12    Date for PT Re-Evaluation  05/04/17    PT Start Time  5573    PT Stop Time  1534    PT Time Calculation (min)  60 min    Activity Tolerance  Patient tolerated treatment well    Behavior During Therapy  [facility] for tasks assessed/performed       Past Medical History:  Diagnosis Date  . Iron deficiency anemia 12/15/2011  . Leukocytosis 12/15/2011  . Pyelonephritis   . 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 VUR (vesicoureteric reflux) 11/30/2011    Past Surgical History:  Procedure Laterality Date  . ABDOMINAL HYSTERECTOMY    . APPENDECTOMY    . CHOLECYSTECTOMY    . GASTRIC BYPASS    . KIDNEY SURGERY    . KNEE SURGERY     left x 2 right x 1  . TONSILLECTOMY      There were no vitals filed for this visit.  Subjective Assessment - 04/18/17 1439    Subjective  "My knee is just giving me fits".  she reports the Rock tape helped reduce swelling in her knee. Her tailbone is sore today.     Currently in Pain?  Yes    Pain Score  5     Pain Location  Back tailbone    Pain Orientation  Lower    Pain Descriptors / Indicators  Sharp;Sore    Aggravating Factors   prolonged positions    Pain Relieving Factors  heat, meds, massage    Pain Score  5    Pain Location  Knee    Pain Orientation  Right    Pain Descriptors / Indicators  Sharp;Stabbing    Aggravating Factors   bending knee, stairs    Pain Relieving Factors  TENS.         [facility] PT Assessment - 04/18/17 0001      Assessment   Medical Diagnosis  LBP     Referring Provider  Dr.  Lynne Leader    Onset Date/Surgical Date  02/22/17    Hand Dominance  Right    Next MD Visit  to be scheduled.       Palpation   Palpation comment  Rt ASIS lower than Lt.:level sacrum in prone,  Rt PSIS higher than Lt.  Rt ilium higher than Lt.          [place] Adult PT Treatment/Exercise - 04/18/17 0001      Lumbar Exercises: Stretches   Passive Hamstring Stretch  Right;Left;30 seconds;3 reps    ITB Stretch Limitations  adductor stretch in supine, with strap - 30 sec x 2 reps RLE.     Piriformis Stretch  Right;Left;2 reps;30 seconds      Lumbar Exercises: Supine   Other Supine Lumbar Exercises  bridge isometric with arm press down (no lifting of hips) with 5 sec hold x 15 reps      Modalities   Modalities  Cryotherapy;Electrical Stimulation;Moist Heat      Moist Heat Therapy   Number Minutes Moist Heat  20 Minutes    Moist Heat Location  Lumbar Spine Rt knee       Cryotherapy   Number Minutes Cryotherapy  20 Minutes    Cryotherapy Location  Knee Rt    Type of Cryotherapy  Ice pack      Electrical Stimulation   Electrical Stimulation Location  bilat sacral and coccyx area     Electrical Stimulation Action  IFC    Electrical Stimulation Parameters  to tolerance     Electrical Stimulation Goals  Pain      Manual Therapy   Manual therapy comments  Rock tape removed from Rt knee    Soft tissue mobilization  STM to Rt/ Lt glute and deep rotators     Myofascial Release  MFR to low back, sacrum    Muscle Energy Technique  MET correction for elevated ilium in Lt sidelying with contract relax of hip abdct, 4 reps; MET to correct ant rotated Rt ilium with contract relax of Rt hamstring with pt in supine x 4 reps. Bridge in between corrections ot reset hips.                   PT Long Term Goals - 04/11/17 1432      PT LONG TERM GOAL #1   Title  Improve trunk and LE mobilty and ROM with patient to demonstrate ROM WFL's and pain free throughout to facilitate return to  normal functional activities 05/04/17    Time  6    Period  Weeks    Status  On-going      PT LONG TERM GOAL #2   Title  Patient to report decresaed pain by 75-80% to sit; stand; walk for functional times of 0-15 min without pain 05/04/17    Time  6    Period  Weeks    Status  On-going      PT LONG TERM GOAL #3   Title  Functional strength to allow patient to return to normal functional activities without limitation 05/04/17    Time  6    Period  Weeks    Status  On-going      PT LONG TERM GOAL #4   Title  Independent in HEP 05/04/17    Time  6    Period  Weeks    Status  On-going      PT LONG TERM GOAL #5   Title  Improve FOTO to </= 44% limitation 05/04/17    Time  6    Period  Weeks    Status  On-going            Plan - 04/18/17 1708    Clinical Impression Statement  Pt had positive response to last session; reporting less back pain and less Rt knee swelling (with tape application).  Pt reported reduction of pain to 3/10 after manual therapy and further relief with estim/MHP at end of session.      Rehab Potential  Good    PT Frequency  2x / week    PT Duration  6 weeks    PT Treatment/Interventions  Patient/family education;ADLs/Self Care Home Management;Cryotherapy;Electrical Stimulation;Iontophoresis 30m/ml Dexamethasone;Moist Heat;Ultrasound;Dry needling;Manual techniques;Neuromuscular re-education;Therapeutic activities;Therapeutic exercise    PT Next Visit Plan  assess pelvis alignment, continue stretching and core stabilization.  continue manual work through low back and coccyx.     Consulted and Agree with Plan of Care  Patient       Patient will benefit from skilled therapeutic intervention  in order to improve the following deficits and impairments:  Postural dysfunction, Improper body mechanics, Pain, Increased fascial restricitons, Increased muscle spasms, Decreased mobility, Decreased range of motion, Decreased activity tolerance  Visit Diagnosis: Acute  bilateral low back pain, with sciatica presence unspecified  Pain in right leg  Other symptoms and signs involving the musculoskeletal system     Problem List Patient Active Problem List   Diagnosis Date Noted  . Aortic atherosclerosis ([place]) 03/06/2017  . Encounter for examination of blood pressure with abnormal findings 04/05/2016  . Orthostatic hypotension 03/24/2016  . 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 Murmur, cardiac 03/24/2016  . Anemia 03/24/2016  . Dizziness 03/24/2016  . 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 Pituitary mass ([place]) 08/16/2015  . Dysfunction of left eustachian tube 02/08/2015  . Vaginitis and vulvovaginitis 01/11/2015  . Chest pain 10/22/2014  . Depression 01/10/2012  . Obesity 12/21/2011  . Iron deficiency anemia 12/15/2011  . 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 Leukocytosis 12/15/2011  . 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 VUR (vesicoureteric reflux) 11/30/2011   Kerin Perna, PTA 04/18/17 5:17 PM  [place], [place], [address] Phone: [phone]   Fax:  [phone]  Name: AICIA BABINSKI MRN: [id number] Date of Birth: 02/16/1951

## 2017-04-20 ENCOUNTER — Ambulatory Visit (INDEPENDENT_AMBULATORY_CARE_PROVIDER_SITE_OTHER): Payer: Private Health Insurance - Indemnity | Admitting: Physical Therapy

## 2017-04-20 ENCOUNTER — Telehealth: Payer: Self-pay

## 2017-04-20 DIAGNOSIS — M79604 Pain in right leg: Secondary | ICD-10-CM | POA: Diagnosis not present

## 2017-04-20 DIAGNOSIS — M25561 Pain in right knee: Secondary | ICD-10-CM

## 2017-04-20 DIAGNOSIS — M545 Low back pain: Secondary | ICD-10-CM

## 2017-04-20 DIAGNOSIS — R29898 Other symptoms and signs involving the musculoskeletal system: Secondary | ICD-10-CM

## 2017-04-20 NOTE — Telephone Encounter (Signed)
MRI ordered.  Follow up after MRI.

## 2017-04-20 NOTE — Telephone Encounter (Signed)
Patient called stated that shot that she got in the office on 04/11/2017 for her knee is not working. Patient is requesting a MRI. Please advise. Rhonda Cunningham,CMA

## 2017-04-20 NOTE — Telephone Encounter (Signed)
Left a message on patient vm advising that order for MRI has been placed. Cloteal Isaacson,CMA

## 2017-04-20 NOTE — Therapy (Signed)
[place], [place], [address] Phone: [phone]   Fax:  [phone]  Physical Therapy Treatment  Patient Details  Name: Selena May MRN: [id number] Date of Birth: 11-Jul-1950 Referring Provider: Dr. Lynne Leader   Encounter Date: 04/20/2017  PT End of Session - 04/20/17 1447    Visit Number  7    Number of Visits  12    Date for PT Re-Evaluation  05/04/17    PT Start Time  1410    PT Stop Time  1505    PT Time Calculation (min)  55 min    Activity Tolerance  Patient limited by pain    Behavior During Therapy  [facility] for tasks assessed/performed       Past Medical History:  Diagnosis Date  . Iron deficiency anemia 12/15/2011  . Leukocytosis 12/15/2011  . Pyelonephritis   . 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 VUR (vesicoureteric reflux) 11/30/2011    Past Surgical History:  Procedure Laterality Date  . ABDOMINAL HYSTERECTOMY    . APPENDECTOMY    . CHOLECYSTECTOMY    . GASTRIC BYPASS    . KIDNEY SURGERY    . KNEE SURGERY     left x 2 right x 1  . TONSILLECTOMY      There were no vitals filed for this visit.  Subjective Assessment - 04/20/17 1610    Subjective  Pt reports she is still battling a kidney infection.  She now has vertigo.  The MD has put order in for MRI of knee.  She reports that her back is feeling better each visit.     Currently in Pain?  Yes    Pain Score  1     Pain Location  Back    Pain Orientation  Lower    Pain Descriptors / Indicators  Dull    Pain Score  8    Pain Location  Knee    Pain Orientation  Right    Pain Descriptors / Indicators  [facility] PT Assessment - 04/20/17 0001      Assessment   Medical Diagnosis  LBP     Referring Provider  Dr. Lynne Leader    Onset Date/Surgical Date  02/22/17    Hand Dominance  Right    Next MD Visit  to be scheduled.         [place] Adult PT Treatment/Exercise -  04/20/17 0001      Lumbar Exercises: Stretches   Passive Hamstring Stretch  Right;Left;30 seconds;3 reps    Quad Stretch  4 reps;30 seconds;Right    ITB Stretch  2 reps;30 seconds supine with strap     ITB Stretch Limitations  adductor stretch in supine, with strap - 30 sec x 2 reps RLE.       Lumbar Exercises: Prone   Straight Leg Raise  5 reps 2 sets, core engaged.     Other Prone Lumbar Exercises  Rt quad set x 5 sec hold x 10 reps (toes curled under      Modalities   Modalities  Electrical Stimulation;Ultrasound;Moist Heat      Moist Heat Therapy   Number Minutes Moist Heat  20 Minutes    Moist Heat Location  Lumbar Spine Rt knee       Electrical Stimulation   Electrical Stimulation Location  bilat sacral and coccyx area  Electrical Stimulation Action  IFC    Electrical Stimulation Parameters   to tolerance    Electrical Stimulation Goals  Pain      Ultrasound   Ultrasound Location  Rt distal sacral border and piriformis    Ultrasound Parameters  100%, 1.3 w/cm2, 8 min     Ultrasound Goals  Pain      Manual Therapy   Soft tissue mobilization  STM to Rt glute and deep rotators                   PT Long Term Goals - 04/11/17 1432      PT LONG TERM GOAL #1   Title  Improve trunk and LE mobilty and ROM with patient to demonstrate ROM WFL's and pain free throughout to facilitate return to normal functional activities 05/04/17    Time  6    Period  Weeks    Status  On-going      PT LONG TERM GOAL #2   Title  Patient to report decresaed pain by 75-80% to sit; stand; walk for functional times of 0-15 min without pain 05/04/17    Time  6    Period  Weeks    Status  On-going      PT LONG TERM GOAL #3   Title  Functional strength to allow patient to return to normal functional activities without limitation 05/04/17    Time  6    Period  Weeks    Status  On-going      PT LONG TERM GOAL #4   Title  Independent in HEP 05/04/17    Time  6    Period  Weeks     Status  On-going      PT LONG TERM GOAL #5   Title  Improve FOTO to </= 44% limitation 05/04/17    Time  6    Period  Weeks    Status  On-going            Plan - 04/20/17 1557    Clinical Impression Statement  Pt reporting decreased low back pain, however Rt knee pain level remains high.   Pt now experiencing vertigo as well.  Pt reported further reduction of LBP at end of session.  Pt will be having MRI of knee soon. Encouraged pt to begin wearing shoes other than heeled boots to decrease stress on knee and back.     Rehab Potential  Good    PT Frequency  2x / week    PT Duration  6 weeks    PT Treatment/Interventions  Patient/family education;ADLs/Self Care Home Management;Cryotherapy;Electrical Stimulation;Iontophoresis 4mg /ml Dexamethasone;Moist Heat;Ultrasound;Dry needling;Manual techniques;Neuromuscular re-education;Therapeutic activities;Therapeutic exercise    PT Next Visit Plan  assess pelvis alignment, continue stretching and core stabilization.  continue manual work through low back and coccyx.     Consulted and Agree with Plan of Care  Patient       Patient will benefit from skilled therapeutic intervention in order to improve the following deficits and impairments:  Postural dysfunction, Improper body mechanics, Pain, Increased fascial restricitons, Increased muscle spasms, Decreased mobility, Decreased range of motion, Decreased activity tolerance  Visit Diagnosis: Acute bilateral low back pain, with sciatica presence unspecified  Pain in right leg  Other symptoms and signs involving the musculoskeletal system     Problem List Patient Active Problem List   Diagnosis Date Noted  . Aortic atherosclerosis ([place]) 03/06/2017  . Encounter for examination of blood pressure with abnormal findings  04/05/2016  . Orthostatic hypotension 03/24/2016  . 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 Murmur, cardiac 03/24/2016  . Anemia 03/24/2016  . Dizziness  03/24/2016  . 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 Pituitary mass ([place]) 08/16/2015  . Dysfunction of left eustachian tube 02/08/2015  . Vaginitis and vulvovaginitis 01/11/2015  . Chest pain 10/22/2014  . Depression 01/10/2012  . Obesity 12/21/2011  . Iron deficiency anemia 12/15/2011  . 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 Leukocytosis 12/15/2011  . 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 VUR (vesicoureteric reflux) 11/30/2011   Kerin Perna, PTA 04/20/17 4:18 PM  [facility] [place], [place], [address] Phone: [phone]   Fax:  [phone]  Name: ARASELI SHERRY MRN: [id number] Date of Birth: Apr 02, 1951

## 2017-04-25 ENCOUNTER — Ambulatory Visit (INDEPENDENT_AMBULATORY_CARE_PROVIDER_SITE_OTHER): Payer: Private Health Insurance - Indemnity | Admitting: Physical Therapy

## 2017-04-25 DIAGNOSIS — M545 Low back pain: Secondary | ICD-10-CM | POA: Diagnosis not present

## 2017-04-25 DIAGNOSIS — M79604 Pain in right leg: Secondary | ICD-10-CM | POA: Diagnosis not present

## 2017-04-25 DIAGNOSIS — R29898 Other symptoms and signs involving the musculoskeletal system: Secondary | ICD-10-CM

## 2017-04-25 NOTE — Therapy (Signed)
[place], [place], [address] Phone: [phone]   Fax:  [phone]  Physical Therapy Treatment  Patient Details  Name: Selena May MRN: [id number] Date of Birth: [birth year] Referring Provider: Dr. Lynne Leader   Encounter Date: 04/25/2017  PT End of Session - 04/25/17 1604    Visit Number  8    Number of Visits  12    Date for PT Re-Evaluation  05/04/17    PT Start Time  1520    PT Stop Time  1621    PT Time Calculation (min)  61 min    Activity Tolerance  Patient tolerated treatment well    Behavior During Therapy  [facility] for tasks assessed/performed       Past Medical History:  Diagnosis Date  . Iron deficiency anemia 12/15/2011  . Leukocytosis 12/15/2011  . Pyelonephritis   . 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 VUR (vesicoureteric reflux) 11/30/2011    Past Surgical History:  Procedure Laterality Date  . ABDOMINAL HYSTERECTOMY    . APPENDECTOMY    . CHOLECYSTECTOMY    . GASTRIC BYPASS    . KIDNEY SURGERY    . KNEE SURGERY     left x 2 right x 1  . TONSILLECTOMY      There were no vitals filed for this visit.  Subjective Assessment - 04/25/17 1522    Subjective  Pt reports she is having an MRI Monday.  She is using her TENS more often with heat, her boyfriend has been massaging her glutes - this has helped as well.  She states she is feeling better than last week.  "The stretches have helped."     Patient Stated Goals  get rid of the back pain     Currently in Pain?  Yes    Pain Score  2     Pain Orientation  Lower    Pain Descriptors / Indicators  Aching    Aggravating Factors   prolonged positions.     Pain Relieving Factors  heat, massage, meds    Pain Score  4    Pain Location  Knee    Pain Orientation  Right    Aggravating Factors   stairs, bending knee    Pain Relieving Factors  TENS, heat         [facility] PT Assessment -  04/25/17 0001      Flexibility   Hamstrings bilat ~90 deg      Palpation   Palpation comment  Iliac crest appear = in standing.        [facility] Adult PT Treatment/Exercise - 04/25/17 0001      Lumbar Exercises: Stretches   Passive Hamstring Stretch  Right;Left;3 reps    Sports administrator  Right;Left;3 reps;30 seconds    Piriformis Stretch  Right;Left;2 reps;30 seconds      Lumbar Exercises: Supine   Other Supine Lumbar Exercises  bridge x 3; bridge isometric with arm press down (no lifting of hips) with 5 sec hold x 10 reps      Lumbar Exercises: Sidelying   Clam  Right;Left;15 reps    Hip Abduction  Right;Left;10 reps      Lumbar Exercises: Prone   Opposite Arm/Leg Raise  Right arm/Left leg;Left arm/Right leg;5 reps after pt reported 8/10 pain in Rt post knee    Other Prone Lumbar Exercises  Rt quad set x 5 sec hold x 10  reps (toes curled under      Moist Heat Therapy   Number Minutes Moist Heat  20 Minutes    Moist Heat Location  Lumbar Spine Rt knee       Electrical Stimulation   Electrical Stimulation Location  bilat sacral and coccyx area     Electrical Stimulation Action  IFC    Electrical Stimulation Parameters  to tolerance     Electrical Stimulation Goals  Pain      Ultrasound   Ultrasound Location  Rt distal sacral border     Ultrasound Parameters  100%, 1.3 w/cm2, 8 min    Ultrasound Goals  Pain tightness      Manual Therapy   Soft tissue mobilization  STM to Rt glute and deep rotators                   PT Long Term Goals - 04/11/17 1432      PT LONG TERM GOAL #1   Title  Improve trunk and LE mobilty and ROM with patient to demonstrate ROM WFL's and pain free throughout to facilitate return to normal functional activities 05/04/17    Time  6    Period  Weeks    Status  On-going      PT LONG TERM GOAL #2   Title  Patient to report decresaed pain by 75-80% to sit; stand; walk for functional times of 0-15 min without pain 05/04/17    Time  6    Period   Weeks    Status  On-going      PT LONG TERM GOAL #3   Title  Functional strength to allow patient to return to normal functional activities without limitation 05/04/17    Time  6    Period  Weeks    Status  On-going      PT LONG TERM GOAL #4   Title  Independent in HEP 05/04/17    Time  6    Period  Weeks    Status  On-going      PT LONG TERM GOAL #5   Title  Improve FOTO to </= 44% limitation 05/04/17    Time  6    Period  Weeks    Status  On-going            Plan - 04/25/17 1549    Clinical Impression Statement  Pt's hamstring flexibility has improved. she reported increased pain in LB with opp arm/leg exercise as well as bridging; reduced with rest and stretches.  Pt reported reduction in pain at end of session. Pt making gradual gains towards goals.     Rehab Potential  Good    PT Frequency  2x / week    PT Duration  6 weeks    PT Treatment/Interventions  Patient/family education;ADLs/Self Care Home Management;Cryotherapy;Electrical Stimulation;Iontophoresis 4mg /ml Dexamethasone;Moist Heat;Ultrasound;Dry needling;Manual techniques;Neuromuscular re-education;Therapeutic activities;Therapeutic exercise    PT Next Visit Plan  continue stretching and core stabilization.  continue manual work through low back and coccyx.        Patient will benefit from skilled therapeutic intervention in order to improve the following deficits and impairments:  Postural dysfunction, Improper body mechanics, Pain, Increased fascial restricitons, Increased muscle spasms, Decreased mobility, Decreased range of motion, Decreased activity tolerance  Visit Diagnosis: Acute bilateral low back pain, with sciatica presence unspecified  Pain in right leg  Other symptoms and signs involving the musculoskeletal system     Problem List Patient Active Problem List  Diagnosis Date Noted  . Aortic atherosclerosis ([place]) 03/06/2017  . Encounter for examination of blood pressure with abnormal findings  04/05/2016  . Orthostatic hypotension 03/24/2016  . 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 Murmur, cardiac 03/24/2016  . Anemia 03/24/2016  . Dizziness 03/24/2016  . 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 Dysfunction of left eustachian tube 02/08/2015  . Vaginitis and vulvovaginitis 01/11/2015  . Chest pain 10/22/2014  . Depression 01/10/2012  . Obesity 12/21/2011  . Iron deficiency anemia 12/15/2011  . 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 Leukocytosis 12/15/2011  . 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 VUR (vesicoureteric reflux) 11/30/2011   Kerin Perna, PTA 04/25/17 [facility] [place], [place], [address] Phone: [phone]   Fax:  [phone]  Name: Selena May MRN: [id number] Date of Birth: 1950/12/23

## 2017-04-27 ENCOUNTER — Ambulatory Visit (INDEPENDENT_AMBULATORY_CARE_PROVIDER_SITE_OTHER): Payer: Private Health Insurance - Indemnity | Admitting: Physical Therapy

## 2017-04-27 DIAGNOSIS — M545 Low back pain: Secondary | ICD-10-CM | POA: Diagnosis not present

## 2017-04-27 DIAGNOSIS — R29898 Other symptoms and signs involving the musculoskeletal system: Secondary | ICD-10-CM

## 2017-04-27 DIAGNOSIS — M79604 Pain in right leg: Secondary | ICD-10-CM

## 2017-04-27 NOTE — Patient Instructions (Signed)
Hip Flexor Stretch    Lying on back near edge of bed, bend one leg, foot flat. Hang other leg over edge, relaxed, thigh resting entirely on bed for _1___ minutes. Repeat __2-3__ times.    [facility] [place], [place] [address]  [phone] (office) [phone] (fax)

## 2017-04-27 NOTE — Therapy (Signed)
[place]  [place], [place], [address] Phone: [phone]   Fax:  [phone]  Physical Therapy Treatment  Patient Details  Name: Selena May MRN: [id number] Date of Birth: 1951-01-24 Referring Provider: Dr. Lynne Leader    Encounter Date: 04/27/2017  PT End of Session - 04/27/17 1615    Visit Number  9    Number of Visits  12    Date for PT Re-Evaluation  05/04/17    PT Start Time  1450    PT Stop Time  1545    PT Time Calculation (min)  55 min    Activity Tolerance  Patient limited by pain    Behavior During Therapy  [facility] for tasks assessed/performed       Past Medical History:  Diagnosis Date  . Iron deficiency anemia 12/15/2011  . Leukocytosis 12/15/2011  . Pyelonephritis   . 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 VUR (vesicoureteric reflux) 11/30/2011    Past Surgical History:  Procedure Laterality Date  . ABDOMINAL HYSTERECTOMY    . APPENDECTOMY    . CHOLECYSTECTOMY    . GASTRIC BYPASS    . KIDNEY SURGERY    . KNEE SURGERY     left x 2 right x 1  . TONSILLECTOMY      There were no vitals filed for this visit.  Subjective Assessment - 04/27/17 1612    Subjective  Pt reports she is unsure what happened but her back, hip and Rt knee pain have spiked again.  After sitting on couch last night, she attempted to get up and the pain intensified. TENS and heat didn't provide as much relief at home last night..      Patient Stated Goals  get rid of the back pain     Currently in Pain?  Yes    Pain Score  8     Pain Location  Back    Pain Orientation  Right;Lower    Pain Descriptors / Indicators  Sharp    Pain Radiating Towards  Rt hip to the bend of the knee    Aggravating Factors   prolonged positions    Pain Relieving Factors  usually heat, massage, meds    Multiple Pain Sites  Yes    Pain Score  8    Pain Location  Knee    Pain Orientation  Right    Pain Descriptors / Indicators  Sharp    Aggravating Factors   stairs, bending knee    Pain Relieving Factors  TENS         [facility] PT Assessment - 04/27/17 0001      Assessment   Medical Diagnosis  LBP     Referring Provider  Dr. Lynne Leader     Onset Date/Surgical Date  02/22/17    Hand Dominance  Right    Next MD Visit  to be scheduled.       Palpation   Palpation comment  Rt ASIS lower than Lt. Rt sacral torsion in prone.  Point tender Rt psoas, glute med, piriformis, and Rt adductors.         [facility] Adult PT Treatment/Exercise - 04/27/17 0001      Lumbar Exercises: Stretches   Passive Hamstring Stretch  Right;Left;3 reps    Hip Flexor Stretch  Right;3 reps;30 seconds;Limitations 1 rep on LLE    Hip Flexor Stretch Limitations  Thomas position, leg off edge of table.  ITB Stretch  Right;1 rep;30 seconds painful, stopped    ITB Stretch Limitations  adductor stretch in supine, with strap - 30 sec x 2 reps RLE.  painful, stopped    Piriformis Stretch  Right;3 reps;30 seconds      Moist Heat Therapy   Number Minutes Moist Heat  20 Minutes    Moist Heat Location  Lumbar Spine Rt posterior thigh      Electrical Stimulation   Electrical Stimulation Location  bilat sacral area, Rt glute med, Rt lumbar paraspinals    Electrical Stimulation Action  IFC    Electrical Stimulation Parameters   to tolerance    Electrical Stimulation Goals  Pain      Manual Therapy   Soft tissue mobilization  STM to Rt adductors, glutes, piriformis.     Myofascial Release  MFR to Rt psoas.     Muscle Energy Technique  Attempted MET correction for ant rotated Rt ilium; pt unable to tolerate position and contract/relax of Rt hamstring.                   PT Long Term Goals - 04/11/17 1432      PT LONG TERM GOAL #1   Title  Improve trunk and LE mobilty and ROM with patient to demonstrate ROM WFL's and pain free throughout to facilitate return to normal functional activities 05/04/17    Time   6    Period  Weeks    Status  On-going      PT LONG TERM GOAL #2   Title  Patient to report decresaed pain by 75-80% to sit; stand; walk for functional times of 0-15 min without pain 05/04/17    Time  6    Period  Weeks    Status  On-going      PT LONG TERM GOAL #3   Title  Functional strength to allow patient to return to normal functional activities without limitation 05/04/17    Time  6    Period  Weeks    Status  On-going      PT LONG TERM GOAL #4   Title  Independent in HEP 05/04/17    Time  6    Period  Weeks    Status  On-going      PT LONG TERM GOAL #5   Title  Improve FOTO to </= 44% limitation 05/04/17    Time  6    Period  Weeks    Status  On-going            Plan - 04/27/17 1609    Clinical Impression Statement  Limited tolerance for exercise due to flare up of symptoms over last day.  Pelvis asymmetries noted; pt unable to tolerate MET corrections.  Pt reported some pain reduction with light STM and MFR to Rt hip.  Pt's knee pain limiting factor to progress for low back at this time. She reported reduction of pain at end of session.  No goals met this visit due to flare up.      Rehab Potential  Good    PT Frequency  2x / week    PT Duration  6 weeks    PT Treatment/Interventions  Patient/family education;ADLs/Self Care Home Management;Cryotherapy;Electrical Stimulation;Iontophoresis 47m/ml Dexamethasone;Moist Heat;Ultrasound;Dry needling;Manual techniques;Neuromuscular re-education;Therapeutic activities;Therapeutic exercise    PT Next Visit Plan  FOTO - end of POC.  Pt has MRI of knee scheduled for 04/30/17.      Consulted and Agree with Plan of  Care  Patient       Patient will benefit from skilled therapeutic intervention in order to improve the following deficits and impairments:  Postural dysfunction, Improper body mechanics, Pain, Increased fascial restricitons, Increased muscle spasms, Decreased mobility, Decreased range of motion, Decreased activity  tolerance  Visit Diagnosis: Acute bilateral low back pain, with sciatica presence unspecified  Pain in right leg  Other symptoms and signs involving the musculoskeletal system     Problem List Patient Active Problem List   Diagnosis Date Noted  . Aortic atherosclerosis ([place]) 03/06/2017  . Encounter for examination of blood pressure with abnormal findings 04/05/2016  . Orthostatic hypotension 03/24/2016  . 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 Murmur, cardiac 03/24/2016  . Anemia 03/24/2016  . Dizziness 03/24/2016  . 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 Dysfunction of left eustachian tube 02/08/2015  . Vaginitis and vulvovaginitis 01/11/2015  . Chest pain 10/22/2014  . Depression 01/10/2012  . Obesity 12/21/2011  . Iron deficiency anemia 12/15/2011  . 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 Leukocytosis 12/15/2011  . 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 VUR (vesicoureteric reflux) 11/30/2011   Kerin Perna, PTA 04/27/17 4:18 PM  [facility] [place], [place], [address] Phone: [phone]   Fax:  [phone]  Name: Selena May MRN: [id number] Date of Birth: [birth year]

## 2017-04-30 ENCOUNTER — Ambulatory Visit (INDEPENDENT_AMBULATORY_CARE_PROVIDER_SITE_OTHER): Payer: Private Health Insurance - Indemnity

## 2017-04-30 DIAGNOSIS — M25561 Pain in right knee: Secondary | ICD-10-CM

## 2017-04-30 DIAGNOSIS — M1711 Unilateral primary osteoarthritis, right knee: Secondary | ICD-10-CM

## 2017-04-30 DIAGNOSIS — M76891 Other specified enthesopathies of right lower limb, excluding foot: Secondary | ICD-10-CM | POA: Diagnosis not present

## 2017-05-02 ENCOUNTER — Encounter: Payer: Self-pay | Admitting: Rehabilitative and Restorative Service Providers"

## 2017-05-02 ENCOUNTER — Ambulatory Visit (INDEPENDENT_AMBULATORY_CARE_PROVIDER_SITE_OTHER): Payer: Private Health Insurance - Indemnity | Admitting: Rehabilitative and Restorative Service Providers"

## 2017-05-02 DIAGNOSIS — M545 Low back pain: Secondary | ICD-10-CM | POA: Diagnosis not present

## 2017-05-02 DIAGNOSIS — R29898 Other symptoms and signs involving the musculoskeletal system: Secondary | ICD-10-CM | POA: Diagnosis not present

## 2017-05-02 DIAGNOSIS — M79604 Pain in right leg: Secondary | ICD-10-CM | POA: Diagnosis not present

## 2017-05-02 NOTE — Therapy (Signed)
[place], [place], [address] Phone: [phone]   Fax:  [phone]  Physical Therapy Treatment  Patient Details  Name: Selena May MRN: [id number] Date of Birth: 02-10-1951 Referring Provider: Dr Lynne Leader    Encounter Date: 05/02/2017  PT End of Session - 05/02/17 1436    Visit Number  10    Number of Visits  12    Date for PT Re-Evaluation  05/04/17    PT Start Time  9381    PT Stop Time  1525    PT Time Calculation (min)  54 min    Activity Tolerance  Patient tolerated treatment well       Past Medical History:  Diagnosis Date  . Iron deficiency anemia 12/15/2011  . Leukocytosis 12/15/2011  . Pyelonephritis   . 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 VUR (vesicoureteric reflux) 11/30/2011    Past Surgical History:  Procedure Laterality Date  . ABDOMINAL HYSTERECTOMY    . APPENDECTOMY    . CHOLECYSTECTOMY    . GASTRIC BYPASS    . KIDNEY SURGERY    . KNEE SURGERY     left x 2 right x 1  . TONSILLECTOMY      There were no vitals filed for this visit.  Subjective Assessment - 05/02/17 1438    Subjective  MRI didn't show any tears, just arthritis. The knee is still bothering her - some days more than others. Therapy helps and she has less pain until the next day after therapy and things are right back where they were before. Still doesn't want to try dry needling.     Currently in Pain?  Yes    Pain Score  4     Pain Orientation  Right;Lower    Pain Descriptors / Indicators  Sharp    Pain Type  Acute pain    Pain Onset  More than a month ago    Pain Frequency  Constant    Multiple Pain Sites  Yes    Pain Score  8    Pain Location  Knee    Pain Orientation  Right    Pain Descriptors / Indicators  Sharp    Pain Radiating Towards  Rt knee and knee cap area , medial knee area and back of the leg          [facility] PT Assessment - 05/02/17  0001      Assessment   Medical Diagnosis  LBP     Referring Provider  Dr Lynne Leader     Onset Date/Surgical Date  02/22/17    Hand Dominance  Right    Next MD Visit  05/11/17      Observation/Other Assessments   Focus on Therapeutic Outcomes (FOTO)   60% limitation       Posture/Postural Control   Posture Comments  head forward; shoulders rounded and elevated; sits with weight shifted to the Rt standing       AROM   Overall AROM Comments  LE mobility is limited by pain greatest in Rt hip and knee - tightness end ranges     Lumbar Flexion  50%    Lumbar Extension  40%    Lumbar - Right Side Bend  65%    Lumbar - Left Side Bend  65%    Lumbar - Right Rotation  45%    Lumbar - Left Rotation  45%  Strength   Overall Strength Comments  Rt LE pain with manual muscle testing - moves LE's against gravity and moves sit to stand, walks - noting functional strength       Flexibility   Soft Tissue Assessment /Muscle Length  -- hip adductor     Hamstrings  Rt LE ~80 deg difficulty extending knee     Quadriceps  tight Rt >> Lt     ITB  tight Rt     Piriformis  tight Rt       Palpation   Spinal mobility  decreased tightness and pain with palpatioin through the lumbar spine     Palpation comment  significant tightness and pain with palpation through Rt psoas; adductors; quads; ITB; hamstrings                   [facility] Adult PT Treatment/Exercise - 05/02/17 0001      Lumbar Exercises: Stretches   Passive Hamstring Stretch  Right;Left;3 reps focus on full knee extension     Hip Flexor Stretch  Right;3 reps;30 seconds;Limitations 1 rep on LLE    Hip Flexor Stretch Limitations  Thomas position, leg off edge of table.     ITB Stretch  Right;1 rep;30 seconds painful, stopped    ITB Stretch Limitations  adductor stretch in supine, with strap - 30 sec x 2 reps RLE.  painful, stopped      Lumbar Exercises: Aerobic   Stationary Bike  Nustep L5 x 5 min comfortable pace       Moist  Heat Therapy   Number Minutes Moist Heat  20 Minutes    Moist Heat Location  Lumbar Spine Rt posterior thigh      Electrical Stimulation   Electrical Stimulation Location  bilat sacral area, Rt glute med, Rt lumbar paraspinals    Electrical Stimulation Action  IFC    Electrical Stimulation Parameters  to tolerance    Electrical Stimulation Goals  Pain      Manual Therapy   Manual therapy comments  pt prone and supine     Soft tissue mobilization  STM to Rt adductors, glutes, piriformis.     Myofascial Release  lumbar and Rt posterior hip                   PT Long Term Goals - 05/02/17 1437      PT LONG TERM GOAL #1   Title  Improve trunk and LE mobilty and ROM with patient to demonstrate ROM WFL's and pain free throughout to facilitate return to normal functional activities 05/04/17    Time  6    Period  Weeks    Status  On-going      PT LONG TERM GOAL #2   Title  Patient to report decresaed pain by 75-80% to sit; stand; walk for functional times of 0-15 min without pain 05/04/17    Time  6    Period  Weeks    Status  On-going      PT LONG TERM GOAL #3   Title  Functional strength to allow patient to return to normal functional activities without limitation 05/04/17    Time  6    Period  Weeks    Status  On-going      PT LONG TERM GOAL #4   Title  Independent in HEP 05/04/17    Time  6    Period  Weeks    Status  On-going  PT LONG TERM GOAL #5   Title  Improve FOTO to </= 44% limitation 05/04/17    Time  6    Period  Weeks    Status  On-going            Plan - 05/02/17 1447    Clinical Impression Statement  Persistent pain primarily in Rt knee. Limping from knee pain tends to irritate the Lt knee and especially the LB. Patient has difficulty tolerating progression of exercises due to pain. Goals limited due to pain. Kalin has significant tightness through Rt LE with spasms at night. She will benefit from reassessment by Dr Georgina Snell to consider other  treatment options.     Rehab Potential  Good    PT Frequency  2x / week    PT Duration  6 weeks    PT Treatment/Interventions  Patient/family education;ADLs/Self Care Home Management;Cryotherapy;Electrical Stimulation;Iontophoresis 4mg /ml Dexamethasone;Moist Heat;Ultrasound;Dry needling;Manual techniques;Neuromuscular re-education;Therapeutic activities;Therapeutic exercise    PT Next Visit Plan  continue treatment pending MD visit     Consulted and Agree with Plan of Care  Patient       Patient will benefit from skilled therapeutic intervention in order to improve the following deficits and impairments:  Postural dysfunction, Improper body mechanics, Pain, Increased fascial restricitons, Increased muscle spasms, Decreased mobility, Decreased range of motion, Decreased activity tolerance  Visit Diagnosis: Acute bilateral low back pain, with sciatica presence unspecified  Pain in right leg  Other symptoms and signs involving the musculoskeletal system     Problem List Patient Active Problem List   Diagnosis Date Noted  . Aortic atherosclerosis ([place]) 03/06/2017  . Encounter for examination of blood pressure with abnormal findings 04/05/2016  . Orthostatic hypotension 03/24/2016  . 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 Murmur, cardiac 03/24/2016  . Anemia 03/24/2016  . Dizziness 03/24/2016  . 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 Dysfunction of left eustachian tube 02/08/2015  . Vaginitis and vulvovaginitis 01/11/2015  . Chest pain 10/22/2014  . Depression 01/10/2012  . Obesity 12/21/2011  . Iron deficiency anemia 12/15/2011  . 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 Leukocytosis 12/15/2011  . 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 VUR (vesicoureteric reflux) 11/30/2011    Aly Hauser Nilda Simmer PT, MPH  05/02/2017, 3:21 PM  [facility] [place]  [place], [place], [address] Phone: [phone]   Fax:  [phone]  Name: TNIYA BOWDITCH MRN: [id number] Date of Birth: 1950/10/25

## 2017-05-04 ENCOUNTER — Ambulatory Visit (INDEPENDENT_AMBULATORY_CARE_PROVIDER_SITE_OTHER): Payer: Private Health Insurance - Indemnity | Admitting: Physical Therapy

## 2017-05-04 DIAGNOSIS — M545 Low back pain: Secondary | ICD-10-CM | POA: Diagnosis not present

## 2017-05-04 DIAGNOSIS — R29898 Other symptoms and signs involving the musculoskeletal system: Secondary | ICD-10-CM | POA: Diagnosis not present

## 2017-05-04 DIAGNOSIS — M79604 Pain in right leg: Secondary | ICD-10-CM | POA: Diagnosis not present

## 2017-05-04 NOTE — Therapy (Addendum)
[facility] [place], [place], [address] Phone: [phone]   Fax:  [phone]  Physical Therapy Treatment  Patient Details  Name: Selena May MRN: [id number] Date of Birth: [birth year] Referring Provider: Dr. Lynne Leader   Encounter Date: 05/04/2017  PT End of Session - 05/04/17 1407    Visit Number  11    Number of Visits  12    Date for PT Re-Evaluation  05/04/17    PT Start Time  1402    PT Stop Time  1500    PT Time Calculation (min)  58 min       Past Medical History:  Diagnosis Date  . Iron deficiency anemia 12/15/2011  . Leukocytosis 12/15/2011  . Pyelonephritis   . 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 VUR (vesicoureteric reflux) 11/30/2011    Past Surgical History:  Procedure Laterality Date  . ABDOMINAL HYSTERECTOMY    . APPENDECTOMY    . CHOLECYSTECTOMY    . GASTRIC BYPASS    . KIDNEY SURGERY    . KNEE SURGERY     left x 2 right x 1  . TONSILLECTOMY      There were no vitals filed for this visit.  Subjective Assessment - 05/04/17 1408    Subjective  Pt reports her Rt knee is "giving her fits".  She reports her RLE is tighter than usual, but she believes it is because she is driving herself today (boyfriend at home)    Currently in Pain?  Yes    Pain Score  8     Pain Location  Back    Pain Orientation  Right;Lower    Pain Descriptors / Indicators  Sharp    Aggravating Factors   prolonged positions    Pain Relieving Factors  heat     Pain Score  9    Pain Location  Knee    Pain Orientation  Right    Pain Descriptors / Indicators  Sharp;Tightness    Aggravating Factors   bending knee, walking     Pain Relieving Factors  TENS and heat.          [facility] PT Assessment - 05/04/17 0001      Assessment   Medical Diagnosis  LBP     Referring Provider  Dr. Lynne Leader    Onset Date/Surgical Date  02/22/17    Hand Dominance  Right    Next MD  Visit  05/09/17      Flexibility   Quadriceps  Rt 115 deg       [facility] Adult PT Treatment/Exercise - 05/04/17 0001      Lumbar Exercises: Aerobic   Stationary Bike  Nustep L5 x 5 min comfortable pace       Knee/Hip Exercises: Stretches   Sports administrator  Right;3 reps;30 seconds    Gastroc Stretch  Right;Left;3 reps;30 seconds;Limitations    Gastroc Stretch Limitations  heels off step; some cramping in calves reported - resolved with rest breatks.       Modalities   Modalities  Iontophoresis      Moist Heat Therapy   Number Minutes Moist Heat  20 Minutes    Moist Heat Location  Lumbar Spine Rt posterior thigh      Electrical Stimulation   Electrical Stimulation Location  bilat sacral area, Rt glute med, Rt lumbar paraspinals    Electrical Stimulation Action  IFC    Electrical Stimulation  Parameters  to tolerance     Electrical Stimulation Goals  Pain      Ultrasound   Ultrasound Location  Rt medial knee/pes anserine    Ultrasound Parameters  50%, 1.0 w/cm2, 8 min    Ultrasound Goals  Edema;Pain      Iontophoresis   Type of Iontophoresis  Dexamethasone    Location  Rt medial knee     Dose  1.0 cc    Time  120 mA (12 hr patch)      Manual Therapy   Soft tissue mobilization  STM to Rt quad, medial knee, Rt distal adductors.                   PT Long Term Goals - 05/02/17 1437      PT LONG TERM GOAL #1   Title  Improve trunk and LE mobilty and ROM with patient to demonstrate ROM WFL's and pain free throughout to facilitate return to normal functional activities 05/04/17    Time  6    Period  Weeks    Status  On-going      PT LONG TERM GOAL #2   Title  Patient to report decresaed pain by 75-80% to sit; stand; walk for functional times of 0-15 min without pain 05/04/17    Time  6    Period  Weeks    Status  On-going      PT LONG TERM GOAL #3   Title  Functional strength to allow patient to return to normal functional activities without limitation 05/04/17    Time   6    Period  Weeks    Status  On-going      PT LONG TERM GOAL #4   Title  Independent in HEP 05/04/17    Time  6    Period  Weeks    Status  On-going      PT LONG TERM GOAL #5   Title  Improve FOTO to </= 44% limitation 05/04/17    Time  6    Period  Weeks    Status  On-going            Plan - 05/04/17 1653    Clinical Impression Statement  Persistent pain in Rt knee and low back. Pt very point tender in Rt adductors, quad, and hamstring.  Pt reported reduction of pain with [place] to knee and estim to LB. Trial of ionto initiated to medial Rt knee.  LImited progress due to high pain level.     Rehab Potential  Good    PT Frequency  2x / week    PT Duration  6 weeks    PT Treatment/Interventions  Patient/family education;ADLs/Self Care Home Management;Cryotherapy;Electrical Stimulation;Iontophoresis 24m/ml Dexamethasone;Moist Heat;Ultrasound;Dry needling;Manual techniques;Neuromuscular re-education;Therapeutic activities;Therapeutic exercise    PT Next Visit Plan  continue treatment pending MD visit     Consulted and Agree with Plan of Care  Patient       Patient will benefit from skilled therapeutic intervention in order to improve the following deficits and impairments:  Postural dysfunction, Improper body mechanics, Pain, Increased fascial restricitons, Increased muscle spasms, Decreased mobility, Decreased range of motion, Decreased activity tolerance  Visit Diagnosis: Acute bilateral low back pain, with sciatica presence unspecified  Pain in right leg  Other symptoms and signs involving the musculoskeletal system     Problem List Patient Active Problem List   Diagnosis Date Noted  . Aortic atherosclerosis ([place] 03/06/2017  . Encounter for examination  of blood pressure with abnormal findings 04/05/2016  . Orthostatic hypotension 03/24/2016  . 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 Murmur, cardiac 03/24/2016  . Anemia 03/24/2016  . Dizziness  03/24/2016  . 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 Dysfunction of left eustachian tube 02/08/2015  . Vaginitis and vulvovaginitis 01/11/2015  . Chest pain 10/22/2014  . Depression 01/10/2012  . Obesity 12/21/2011  . Iron deficiency anemia 12/15/2011  . 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 Leukocytosis 12/15/2011  . 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 VUR (vesicoureteric reflux) 11/30/2011   Kerin Perna, PTA 05/04/17 5:00 PM  [facility], [place], [address] Phone: [phone]   Fax:  [phone]  Name: Selena May MRN: [id number] Date of Birth: June 08, 1950  PHYSICAL THERAPY DISCHARGE SUMMARY  Visits from Start of Care: 11  Current functional level related to goals / functional outcomes: See progress note for discharge status   Remaining deficits: Unknown    Education / Equipment: HEP  Plan: Patient agrees to discharge.  Patient goals were partially met. Patient is being discharged due to the patient's request.  ?????     Celyn P. Helene Kelp PT, MPH 05/16/17 2:49 PM

## 2017-05-09 ENCOUNTER — Ambulatory Visit (INDEPENDENT_AMBULATORY_CARE_PROVIDER_SITE_OTHER): Payer: Private Health Insurance - Indemnity | Admitting: Family Medicine

## 2017-05-09 ENCOUNTER — Encounter: Payer: Self-pay | Admitting: Family Medicine

## 2017-05-09 ENCOUNTER — Encounter: Payer: Private Health Insurance - Indemnity | Admitting: Rehabilitative and Restorative Service Providers"

## 2017-05-09 VITALS — BP 122/77 | HR 65 | Ht 61.0 in | Wt 201.0 lb

## 2017-05-09 DIAGNOSIS — M1711 Unilateral primary osteoarthritis, right knee: Secondary | ICD-10-CM

## 2017-05-09 DIAGNOSIS — R635 Abnormal weight gain: Secondary | ICD-10-CM | POA: Diagnosis not present

## 2017-05-09 MED ORDER — DICLOFENAC SODIUM 1 % TD GEL: 4.0000 g | Freq: Four times a day (QID) | TRANSDERMAL | 11 refills | Status: DC

## 2017-05-09 MED ORDER — PHENTERMINE HCL 37.5 MG PO CAPS: ORAL_CAPSULE | ORAL | 0 refills | Status: DC

## 2017-05-09 NOTE — Progress Notes (Signed)
Selena May is a 67 y.o. female who presents to [facility] today for follow up right knee pain.  Ms Touch continues to have pain in the right knee.  She had a trial of a steroid injection which only lasted a few days.  Additionally she has had physical therapy which has not helped much for her right knee. She had a MRI that did show DJD but no significant injury to explain the pain.    Additionally [name] would like to retry phentermine for weight loss.  She has used phentermine in the past and had good results and tolerated it well.  Past Medical History:  Diagnosis Date  . Iron deficiency anemia 12/15/2011  . Leukocytosis 12/15/2011  . Pyelonephritis   . 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 VUR (vesicoureteric reflux) 11/30/2011   Past Surgical History:  Procedure Laterality Date  . ABDOMINAL HYSTERECTOMY    . APPENDECTOMY    . CHOLECYSTECTOMY    . GASTRIC BYPASS    . KIDNEY SURGERY    . KNEE SURGERY     left x 2 right x 1  . TONSILLECTOMY     Social History   Tobacco Use  . Smoking status: Never Smoker  . Smokeless tobacco: Never Used  Substance Use Topics  . Alcohol use: No     ROS:  As above   Medications: Current Outpatient Medications  Medication Sig Dispense Refill  . IRON PO Take by mouth.    . meloxicam (MOBIC) 15 MG tablet Take 1 tablet (15 mg total) by mouth daily. Every day for one week, then as needed for pain 30 tablet 0  . sertraline (ZOLOFT) 50 MG tablet TAKE 1 TABLET BY MOUTH DAILY 90 tablet 0  . diclofenac sodium (VOLTAREN) 1 % GEL Apply 4 g topically 4 (four) times daily. To affected joint. 100 g 11  . phentermine 37.5 MG capsule One capsule by mouth qAM 30 capsule 0   No current facility-administered medications for this visit.    Allergies  Allergen Reactions  . Ciprofloxacin     seizure  . Sulfa Antibiotics     seizures     Exam:  BP 122/77    Pulse 65   Ht 5\' 1"  (1.549 m)   Wt 201 lb (91.2 kg)   BMI 37.98 kg/m  General: Well Developed, well nourished, and in no acute distress.  Neuro/Psych: Alert and oriented x3, extra-ocular muscles intact, able to move all 4 extremities, sensation grossly intact. Skin: Warm and dry, no rashes noted.  Respiratory: Not using accessory muscles, speaking in full sentences, trachea midline.  Cardiovascular: Pulses palpable, no extremity edema. Abdomen: Does not appear distended. MSK: Right knee Normal appearing. Normal ROM Mild TTP medial joint line.  Stable ligament exam  CLINICAL DATA:  Pain and swelling since a twisting fall 2-3 months ago.  EXAM: MRI OF THE RIGHT KNEE WITHOUT CONTRAST  TECHNIQUE: Multiplanar, multisequence MR imaging of the knee was performed. No intravenous contrast was administered.  COMPARISON:  Radiographs dated 03/28/2017  FINDINGS: MENISCI  Medial meniscus: There is slight intrinsic degeneration of the posterior horn without a tear.  Lateral meniscus:  Normal.  LIGAMENTS  Cruciates:  Normal.  Collaterals:  Normal.  CARTILAGE  Patellofemoral: Slight thinning of the articular cartilage of the patella and trochlear groove.  Medial: Small focal area of cartilage thinning on the medial femoral condyle.  Lateral: Small focal area of  thinning of the central portion of the lateral femoral condyle.  Joint:  Minimal joint effusion.  Popliteal Fossa: Focal tendinopathy and adjacent edema around the distal semimembranosus tendon, best seen on image 24 of series 7. No Baker's cyst. Intact popliteus tendon.  Extensor Mechanism:  Normal.  Bones:  Minimal tricompartmental marginal osteophytes.  Other: None  IMPRESSION: Focal tendinopathy and inflammation of the distal semimembranosus tendon at the posteromedial corner of the proximal tibia.  Small areas of partial-thickness cartilage loss in all  3 compartments.  Degeneration of the posterior horn of the medial meniscus without a meniscal tear.   Electronically Signed   By: Lorriane Shire M.D.     Assessment and Plan: 67 y.o. female with Right knee pain likely due to DJD.  After discussion plan for Orthovisc trial. Will also provide voltaren gel and recheck in the near future.  Weight Gain: Plan trial of phentermine. Recheck in 1 month. Will transition weight management back to PCP when knee stabilizes.   No orders of the defined types were placed in this encounter.  Meds ordered this encounter  Medications  . diclofenac sodium (VOLTAREN) 1 % GEL    Sig: Apply 4 g topically 4 (four) times daily. To affected joint.    Dispense:  100 g    Refill:  11  . phentermine 37.5 MG capsule    Sig: One capsule by mouth qAM    Dispense:  30 capsule    Refill:  0    Discussed warning signs or symptoms. Please see discharge instructions. Patient expresses understanding.  I spent 25 minutes with this patient, greater than 50% was face-to-face time counseling regarding ddx and treatment plan.

## 2017-05-09 NOTE — Patient Instructions (Addendum)
Thank you for coming in today. We will use diclofenac gel for pain.  We will try to authorize the orthovisc Start phentermine and work on calorie counting.  Recheck in 1 month for knee pain and weight check.   Phentermine sustained-release capsules What is this medicine? PHENTERMINE (FEN ter meen) decreases your appetite. It is used with a reduced calorie diet and exercise to help you lose weight. This medicine may be used for other purposes; ask your health care provider or pharmacist if you have questions. COMMON BRAND NAME(S): Ionamin, Pro-Fast What should I tell my health care provider before I take this medicine? They need to know if you have any of these conditions: -agitation -glaucoma -heart disease -high blood pressure -history of substance abuse -lung disease called Primary Pulmonary Hypertension (PPH) -taken an MAOI like Carbex, Eldepryl, Marplan, Nardil, or Parnate in last 14 days -thyroid disease -an unusual or allergic reaction to phentermine, other medicines, foods, dyes, or preservatives -pregnant or trying to get pregnant -breast-feeding How should I use this medicine? Take this medicine by mouth with a glass of water. Follow the directions on the prescription label. This medicine is usually taken before breakfast or at least 10 to 14 hours before going to bed. Avoid taking this medicine in the evening. It may interfere with sleep. Swallow whole. Do not open or chew the capsules. Take your doses at regular intervals. Do not take your medicine more often than directed. Talk to your pediatrician regarding the use of this medicine in children. Special care may be needed. Overdosage: If you think you have taken too much of this medicine contact a poison control center or emergency room at once. NOTE: This medicine is only for you. Do not share this medicine with others. What if I miss a dose? If you miss a dose, take it as soon as you can. If it is almost time for your next  dose, take only that dose. Do not take double or extra doses. What may interact with this medicine? Do not take this medicine with any of the following medications: -duloxetine -MAOIs like Carbex, Eldepryl, Marplan, Nardil, and Parnate -medicines for colds or breathing difficulties like pseudoephedrine or phenylephrine -procarbazine -sibutramine -SSRIs like citalopram, escitalopram, fluoxetine, fluvoxamine, paroxetine, and sertraline -stimulants like dexmethylphenidate, methylphenidate or modafinil -venlafaxine This medicine may also interact with the following medications: -medicines for diabetes This list may not describe all possible interactions. Give your health care provider a list of all the medicines, herbs, non-prescription drugs, or dietary supplements you use. Also tell them if you smoke, drink alcohol, or use illegal drugs. Some items may interact with your medicine. What should I watch for while using this medicine? Notify your physician immediately if you become short of breath while doing your normal activities. Do not take this medicine within 6 hours of bedtime. It can keep you from getting to sleep. Avoid drinks that contain caffeine and try to stick to a regular bedtime every night. This medicine was intended to be used in addition to a healthy diet and exercise. The best results are achieved this way. This medicine is only indicated for short-term use. Eventually your weight loss may level out. At that point, the drug will only help you maintain your new weight. Do not increase or in any way change your dose without consulting your doctor. You may get drowsy or dizzy. Do not drive, use machinery, or do anything that needs mental alertness until you know how this medicine affects you.  Do not stand or sit up quickly, especially if you are an older patient. This reduces the risk of dizzy or fainting spells. Alcohol may increase dizziness and drowsiness. Avoid alcoholic  drinks. What side effects may I notice from receiving this medicine? Side effects that you should report to your doctor or health care professional as soon as possible: -chest pain, palpitations -depression or severe changes in mood -increased blood pressure -irritability -nervousness or restlessness -severe dizziness -shortness of breath -problems urinating -unusual swelling of the legs -vomiting Side effects that usually do not require medical attention (report to your doctor or health care professional if they continue or are bothersome): -blurred vision or other eye problems -changes in sexual ability or desire -constipation or diarrhea -difficulty sleeping -dry mouth or unpleasant taste -headache -nausea This list may not describe all possible side effects. Call your doctor for medical advice about side effects. You may report side effects to FDA at 1-800-FDA-1088. Where should I keep my medicine? Keep out of the reach of children. This medicine can be abused. Keep your medicine in a safe place to protect it from theft. Do not share this medicine with anyone. Selling or giving away this medicine is dangerous and against the law. This medicine may cause accidental overdose and death if taken by other adults, children, or pets. Mix any unused medicine with a substance like cat litter or coffee grounds. Then throw the medicine away in a sealed container like a sealed bag or a coffee can with a lid. Do not use the medicine after the expiration date. Store at room temperature between 20 and 25 degrees C (68 and 77 degrees F). Keep container tightly closed. NOTE: This sheet is a summary. It may not cover all possible information. If you have questions about this medicine, talk to your doctor, pharmacist, or health care provider.  2018 Elsevier/Gold Standard (2013-12-09 16:19:17)

## 2017-05-10 DIAGNOSIS — M1711 Unilateral primary osteoarthritis, right knee: Secondary | ICD-10-CM | POA: Insufficient documentation

## 2017-05-11 ENCOUNTER — Ambulatory Visit: Payer: Private Health Insurance - Indemnity | Admitting: Family Medicine

## 2017-05-15 ENCOUNTER — Telehealth: Payer: Self-pay | Admitting: Family Medicine

## 2017-05-15 NOTE — Telephone Encounter (Signed)
-----   Message from Huel Cote, LPN sent at 1/00/3496  3:39 PM EST -----   ----- Message ----- From: Gregor Hams, MD Sent: 05/09/2017   2:00 PM To: Huel Cote, LPN  Joella Prince Josem Kaufmann

## 2017-05-15 NOTE — Progress Notes (Signed)
Orthovisc

## 2017-05-15 NOTE — Telephone Encounter (Signed)
Submitted information via OV web site. Awaiting determination.

## 2017-05-17 NOTE — Telephone Encounter (Signed)
Orthovisc is covered. There is no copay. After the deductible has been met, the patient's responsibility will be 20% of the allowable amount. Once the out of pocket is met, the patient will have no financial responsibility. The pharmacy option is available, but is carved out through the pharmacy benefit. See the pharmacy benefits for next steps. Per Representative [place] is Secondary and Medicare is Primary. Call reference number is [id number].    LMOM for patient to call back about Orthovisc.

## 2017-05-21 NOTE — Telephone Encounter (Signed)
LMOM for patient to call back about her Orthovisc.

## 2017-05-22 NOTE — Telephone Encounter (Addendum)
Patient has been scheduled for the knee injections. Left a message for patient to call back about the injection fee as well.

## 2017-05-23 ENCOUNTER — Encounter: Payer: Self-pay | Admitting: Family Medicine

## 2017-05-23 ENCOUNTER — Ambulatory Visit (INDEPENDENT_AMBULATORY_CARE_PROVIDER_SITE_OTHER): Payer: Private Health Insurance - Indemnity | Admitting: Family Medicine

## 2017-05-23 VITALS — BP 112/74 | HR 98 | Wt 198.0 lb

## 2017-05-23 DIAGNOSIS — M1711 Unilateral primary osteoarthritis, right knee: Secondary | ICD-10-CM

## 2017-05-23 DIAGNOSIS — M25561 Pain in right knee: Secondary | ICD-10-CM

## 2017-05-23 NOTE — Progress Notes (Signed)
Procedure: Real-time Ultrasound Guided Injection of right knee orthovisc injection 1/4  Device: GE Logiq E  Images permanently stored and available for review in the ultrasound unit. Verbal informed consent obtained. Discussed risks and benefits of procedure. Warned about infection bleeding damage to structures skin hypopigmentation and fat atrophy among others. Patient expresses understanding and agreement Time-out conducted.  Noted no overlying erythema, induration, or other signs of local infection.  Skin prepped in a sterile fashion.  Local anesthesia: Topical Ethyl chloride.  With sterile technique and under real time ultrasound guidance: orthovisc  injected easily.  Completed without difficulty    Advised to call if fevers/chills, erythema, induration, drainage, or persistent bleeding.  Images permanently stored and available for review in the ultrasound unit.  Impression: Technically successful ultrasound guided injection.  Lot number [id number]

## 2017-05-23 NOTE — Patient Instructions (Signed)
Thank you for coming in today. Call or go to the ER if you develop a large red swollen joint with extreme pain or oozing puss.  Recheck in 1 week for Orthovisc injection number 2/4.

## 2017-05-23 NOTE — Telephone Encounter (Addendum)
Patient stated that she has not met her deductible but [employer] stated that they would be paying for the injections through her workman's compensation. Patient will be advised that she can contact billing to get a copy of the bill from the visit.

## 2017-05-27 ENCOUNTER — Other Ambulatory Visit: Payer: Self-pay | Admitting: Family Medicine

## 2017-05-27 DIAGNOSIS — M545 Low back pain, unspecified: Secondary | ICD-10-CM

## 2017-05-30 ENCOUNTER — Ambulatory Visit (INDEPENDENT_AMBULATORY_CARE_PROVIDER_SITE_OTHER): Payer: Private Health Insurance - Indemnity | Admitting: Family Medicine

## 2017-05-30 ENCOUNTER — Encounter: Payer: Self-pay | Admitting: Family Medicine

## 2017-05-30 VITALS — BP 120/79 | HR 80 | Wt 201.0 lb

## 2017-05-30 DIAGNOSIS — M1711 Unilateral primary osteoarthritis, right knee: Secondary | ICD-10-CM

## 2017-05-30 NOTE — Progress Notes (Signed)
Procedure: Real-time Ultrasound Guided Injection of right knee orthovisc injection 2/4  Device: GE Logiq E  Images permanently stored and available for review in the ultrasound unit. Verbal informed consent obtained. Discussed risks and benefits of procedure. Warned about infection bleeding damage to structures skin hypopigmentation and fat atrophy among others. Patient expresses understanding and agreement Time-out conducted.  Noted no overlying erythema, induration, or other signs of local infection.  Skin prepped in a sterile fashion.  Local anesthesia: Topical Ethyl chloride.  With sterile technique and under real time ultrasound guidance: orthovisc  injected easily.  Completed without difficulty    Advised to call if fevers/chills, erythema, induration, drainage, or persistent bleeding.  Images permanently stored and available for review in the ultrasound unit.  Impression: Technically successful ultrasound guided injection.

## 2017-05-30 NOTE — Patient Instructions (Signed)
Thank you for coming in today.   Call or go to the ER if you develop a large red swollen joint with extreme pain or oozing puss.   

## 2017-06-06 ENCOUNTER — Ambulatory Visit (INDEPENDENT_AMBULATORY_CARE_PROVIDER_SITE_OTHER): Payer: Private Health Insurance - Indemnity | Admitting: Family Medicine

## 2017-06-06 ENCOUNTER — Encounter: Payer: Self-pay | Admitting: Family Medicine

## 2017-06-06 VITALS — BP 134/74 | HR 64 | Ht 61.0 in | Wt 201.0 lb

## 2017-06-06 DIAGNOSIS — M1711 Unilateral primary osteoarthritis, right knee: Secondary | ICD-10-CM | POA: Diagnosis not present

## 2017-06-06 NOTE — Progress Notes (Signed)
Procedure: Real-time Ultrasound Guided Injection of right knee orthovisc injection 3/4  Device: GE Logiq E  Images permanently stored and available for review in the ultrasound unit. Verbal informed consent obtained. Discussed risks and benefits of procedure. Warned about infection bleeding damage to structures skin hypopigmentation and fat atrophy among others. Patient expresses understanding and agreement Time-out conducted.  Noted no overlying erythema, induration, or other signs of local infection.  Skin prepped in a sterile fashion.  Local anesthesia: Topical Ethyl chloride.  With sterile technique and under real time ultrasound guidance: orthovisc  injected easily.  Completed without difficulty    Advised to call if fevers/chills, erythema, induration, drainage, or persistent bleeding.  Images permanently stored and available for review in the ultrasound unit.  Impression: Technically successful ultrasound guided injection.  Return in 1 week for Orthovisc injection 4/4

## 2017-06-06 NOTE — Patient Instructions (Signed)
Thank you for coming in today. Recheck in 1 week as scheduled.  Return sooner if needed.

## 2017-06-13 ENCOUNTER — Ambulatory Visit (INDEPENDENT_AMBULATORY_CARE_PROVIDER_SITE_OTHER): Payer: Private Health Insurance - Indemnity | Admitting: Family Medicine

## 2017-06-13 ENCOUNTER — Encounter: Payer: Self-pay | Admitting: Family Medicine

## 2017-06-13 VITALS — BP 167/91 | HR 69 | Ht 61.0 in | Wt 203.0 lb

## 2017-06-13 DIAGNOSIS — Z1231 Encounter for screening mammogram for malignant neoplasm of breast: Secondary | ICD-10-CM

## 2017-06-13 DIAGNOSIS — M1711 Unilateral primary osteoarthritis, right knee: Secondary | ICD-10-CM

## 2017-06-13 DIAGNOSIS — M25561 Pain in right knee: Secondary | ICD-10-CM | POA: Diagnosis not present

## 2017-06-13 DIAGNOSIS — G8929 Other chronic pain: Secondary | ICD-10-CM

## 2017-06-13 DIAGNOSIS — Z1239 Encounter for other screening for malignant neoplasm of breast: Secondary | ICD-10-CM

## 2017-06-13 DIAGNOSIS — I10 Essential (primary) hypertension: Secondary | ICD-10-CM | POA: Diagnosis not present

## 2017-06-13 DIAGNOSIS — Z780 Asymptomatic menopausal state: Secondary | ICD-10-CM

## 2017-06-13 NOTE — Progress Notes (Signed)
Procedure: Real-time Ultrasound Guided Injection of right knee orthovisc injection 4/4  Device: GE Logiq E  Images permanently stored and available for review in the ultrasound unit. Verbal informed consent obtained. Discussed risks and benefits of procedure. Warned about infection bleeding damage to structures skin hypopigmentation and fat atrophy among others. Patient expresses understanding and agreement Time-out conducted.  Noted no overlying erythema, induration, or other signs of local infection.  Skin prepped in a sterile fashion.  Local anesthesia: Topical Ethyl chloride.  With sterile technique and under real time ultrasound guidance: orthovisc  injected easily.  Completed without difficulty    Advised to call if fevers/chills, erythema, induration, drainage, or persistent bleeding.  Images permanently stored and available for review in the ultrasound unit.  Impression: Technically successful ultrasound guided injection.  Health maintenancealso reviewed.  Mammogram ordered.  Dexa Ordered.  Cologuard ordered.  Follow up with PCP to discuss HIV, HepC, PNA vaccine. Follow up with PCP to discuss HTN.    I spent 10 minutes with this patient, greater than 50% was face-to-face time counseling regarding health maintenance.

## 2017-06-13 NOTE — Patient Instructions (Addendum)
Thank you for coming in today. Recheck as needed.  Call or go to the ER if you develop a large red swollen joint with extreme pain or oozing puss.   Get mammogram and bone density.  Send back cologuard.

## 2017-06-27 ENCOUNTER — Ambulatory Visit (INDEPENDENT_AMBULATORY_CARE_PROVIDER_SITE_OTHER): Payer: Private Health Insurance - Indemnity

## 2017-06-27 ENCOUNTER — Telehealth: Payer: Self-pay | Admitting: Family Medicine

## 2017-06-27 ENCOUNTER — Encounter: Payer: Self-pay | Admitting: Family Medicine

## 2017-06-27 DIAGNOSIS — Z1231 Encounter for screening mammogram for malignant neoplasm of breast: Secondary | ICD-10-CM

## 2017-06-27 DIAGNOSIS — M85851 Other specified disorders of bone density and structure, right thigh: Secondary | ICD-10-CM

## 2017-06-27 DIAGNOSIS — M8580 Other specified disorders of bone density and structure, unspecified site: Secondary | ICD-10-CM

## 2017-06-27 DIAGNOSIS — Z780 Asymptomatic menopausal state: Secondary | ICD-10-CM

## 2017-06-27 DIAGNOSIS — Z1239 Encounter for other screening for malignant neoplasm of breast: Secondary | ICD-10-CM

## 2017-06-27 DIAGNOSIS — M899 Disorder of bone, unspecified: Secondary | ICD-10-CM

## 2017-06-27 HISTORY — DX: Other specified disorders of bone density and structure, unspecified site: M85.80

## 2017-06-27 NOTE — Telephone Encounter (Signed)
Left message on patient vm advising. Rhonda Cunningham,CMA

## 2017-06-27 NOTE — Telephone Encounter (Signed)
Labs to evaluate osteopenia ordered.

## 2017-10-01 ENCOUNTER — Ambulatory Visit (INDEPENDENT_AMBULATORY_CARE_PROVIDER_SITE_OTHER): Payer: Private Health Insurance - Indemnity | Admitting: Physician Assistant

## 2017-10-01 ENCOUNTER — Encounter: Payer: Self-pay | Admitting: Physician Assistant

## 2017-10-01 VITALS — BP 166/66 | HR 68 | Temp 98.3°F | Ht 60.98 in | Wt 202.0 lb

## 2017-10-01 DIAGNOSIS — N390 Urinary tract infection, site not specified: Secondary | ICD-10-CM

## 2017-10-01 DIAGNOSIS — N3001 Acute cystitis with hematuria: Secondary | ICD-10-CM

## 2017-10-01 DIAGNOSIS — F339 Major depressive disorder, recurrent, unspecified: Secondary | ICD-10-CM

## 2017-10-01 DIAGNOSIS — R030 Elevated blood-pressure reading, without diagnosis of hypertension: Secondary | ICD-10-CM | POA: Diagnosis not present

## 2017-10-01 LAB — POCT URINALYSIS DIPSTICK
BILIRUBIN UA: NEGATIVE
GLUCOSE UA: NEGATIVE
Ketones, UA: NEGATIVE
Protein, UA: NEGATIVE
SPEC GRAV UA: 1.02 (ref 1.010–1.025)
Urobilinogen, UA: 0.2 E.U./dL
pH, UA: 5.5 (ref 5.0–8.0)

## 2017-10-01 MED ORDER — NITROFURANTOIN MONOHYD MACRO 100 MG PO CAPS
100.0000 mg | ORAL_CAPSULE | Freq: Two times a day (BID) | ORAL | 0 refills | Status: DC
Start: 2017-10-01 — End: 2018-05-20

## 2017-10-01 MED ORDER — CEFTRIAXONE SODIUM 500 MG IJ SOLR
500.0000 mg | Freq: Once | INTRAMUSCULAR | Status: AC
  Administered 2017-10-01: 1 g via INTRAMUSCULAR

## 2017-10-01 MED ORDER — SERTRALINE HCL 50 MG PO TABS
50.0000 mg | ORAL_TABLET | Freq: Every day | ORAL | 0 refills | Status: DC
Start: 2017-10-01 — End: 2018-03-04

## 2017-10-01 NOTE — Patient Instructions (Signed)

## 2017-10-01 NOTE — Progress Notes (Signed)
Subjective:    Patient ID: Selena May, female    DOB: [birth year], 67 y.o.   MRN: [id number]  HPI  Pt is a 67 yo female with hx of recurrent UTI's and depression who presents to the clinic with dysuria, low back pain, urine odor that started 4 days ago. She has struggled with UTI's her whole life. She sees Dr. Estill May, urologist, and had multiple procedures/testing/preventative tried but no relief. Her last abx was keflex and stopped 2 weeks ago. Her symptoms improve but she feels like "the infection is never fully treated". She is nauseated today but no vomiting or fever.   She does request zoloft to be refiled. She is doing well with no concerns. No SI/HC.   Marland Kitchen. Active Ambulatory Problems    Diagnosis Date Noted  . 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 VUR (vesicoureteric reflux) 11/30/2011  . Iron deficiency anemia 12/15/2011  . 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 Leukocytosis 12/15/2011  . Obesity 12/21/2011  . Depression 01/10/2012  . Chest pain 10/22/2014  . Vaginitis and vulvovaginitis 01/11/2015  . Dysfunction of left eustachian tube 02/08/2015  . 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 Orthostatic hypotension 03/24/2016  . 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 Murmur, cardiac 03/24/2016  . Anemia 03/24/2016  . Dizziness 03/24/2016  . Encounter for examination of blood pressure with abnormal findings 04/05/2016  . Aortic atherosclerosis ([place]) 03/06/2017  . 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 Osteopenia determined by x-ray 06/27/2017  . Elevated blood pressure reading 10/01/2017   Resolved Ambulatory Problems    Diagnosis Date Noted  . UTI (urinary tract infection) 10/22/2014   Past Medical History:  Diagnosis Date  . Iron deficiency anemia 12/15/2011  . Leukocytosis 12/15/2011  . Osteopenia determined by x-ray 06/27/2017  . Pyelonephritis   . 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 VUR (vesicoureteric reflux) 11/30/2011      Review of Systems    see HPI.  Objective:   Physical Exam  Constitutional: She is oriented to person, place, and time. She appears well-developed and well-nourished.  HENT:  Head: Normocephalic and atraumatic.  Cardiovascular: Normal rate and regular rhythm.  Pulmonary/Chest: Effort normal and breath sounds normal.  Bilateral CVA tenderness.   Abdominal: Soft. Bowel sounds are normal. There is no tenderness.  Neurological: She is alert and oriented to person, place, and time.  Psychiatric: She has a normal mood and affect. Her behavior is normal.          Assessment & Plan:  Marland KitchenMarland KitchenJakeira was seen today for pyelonephritis.  Diagnoses and all orders for this visit:  Acute cystitis with hematuria -     nitrofurantoin, macrocrystal-monohydrate, (MACROBID) 100 MG capsule; Take 1 capsule (100 mg total) by mouth 2 (two) times daily. For 10days -     cefTRIAXone (ROCEPHIN) injection 500 mg -     Urine Culture -     POCT Urinalysis Dipstick  Recurrent UTI -     POCT Urinalysis Dipstick  Elevated blood pressure reading  Depression, recurrent (HCC) -     sertraline (ZOLOFT) 50 MG tablet; Take 1 tablet (50 mg total) by mouth daily.   .. Results for orders placed or performed in visit on 10/01/17  POCT Urinalysis Dipstick  Result Value Ref Range   Color, UA light yellow    Clarity, UA clear    Glucose, UA Negative  Negative   Bilirubin, UA negative    Ketones, UA negative    Spec Grav, UA 1.020 1.010 - 1.025   Blood, UA trace-lysed    pH, UA 5.5 5.0 - 8.0   Protein, UA Negative Negative   Urobilinogen, UA 0.2 0.2 or 1.0 E.U./dL   Nitrite, UA postive    Leukocytes, UA Large (3+) (A) Negative   Appearance     Odor     UA appears positive for UTI with positive nitrates/leukocytes/blood. Will culture to confirm. Concern for pyelonephritis due to CVA tenderness and nausea with  recurrent UTI hx. 1 gram of rocephin given today and start macrobid tomorrow. Symptomatic care discussed. Vitals reassuring today. Follow up if worsening. Recommend urine culture for clearance of infection. Follow up with urology.   Elevated BP today. No tachycardia. No hx of HTN. Pt is acutely ill today. Follow up for BP recheck with PCP in next 2 weeks.   90 day zoloft rx refilled. Follow up with PcP for future refills.

## 2017-10-02 ENCOUNTER — Encounter: Payer: Self-pay | Admitting: Physician Assistant

## 2017-10-03 LAB — URINE CULTURE
MICRO NUMBER:: 90783753
SPECIMEN QUALITY: ADEQUATE

## 2017-10-03 NOTE — Progress Notes (Signed)
Call pt: let pt know macrobid is sensitive and should treat infection.

## 2018-02-04 ENCOUNTER — Ambulatory Visit (INDEPENDENT_AMBULATORY_CARE_PROVIDER_SITE_OTHER): Payer: Private Health Insurance - Indemnity

## 2018-02-04 ENCOUNTER — Ambulatory Visit (INDEPENDENT_AMBULATORY_CARE_PROVIDER_SITE_OTHER): Payer: Private Health Insurance - Indemnity | Admitting: Family Medicine

## 2018-02-04 VITALS — BP 149/49 | HR 66 | Ht 61.5 in | Wt 201.0 lb

## 2018-02-04 DIAGNOSIS — M542 Cervicalgia: Secondary | ICD-10-CM

## 2018-02-04 DIAGNOSIS — M25511 Pain in right shoulder: Secondary | ICD-10-CM

## 2018-02-04 NOTE — Progress Notes (Signed)
Selena May is a 67 y.o. female who presents to [facility] today for right shoulder pain. Selena May' right shoulder started hurting a couple of months ago, and she thought this was due to arthritis. It starting getting worse over the past 2-3 weeks to the point that it hurts all the time with pain extending into her neck and all the way down to her wrist.  Majority of the pain is confined to the lateral upper arm and not radiating past the level of the elbow.  Pain hurts with overhead motion reaching back and at bedtime.  She notes that she is unable to even drink from a soda can at times, due to pain in her shoulder. She will sometimes have to use her left hand to move her right arm, as active motion causes pain. She cannot lift her right arm overhead.   She has tried heat, massage, and NSAIDs without any relief. She has numbness some times in her whole arm, especially around her elbow. She denies tingling.  She denies any specific injury.   ROS:  As above  Exam:  BP (!) 149/49   Pulse 66   Ht 5' 1.5" (1.562 m)   Wt 201 lb (91.2 kg)   BMI 37.36 kg/m  General: Well Developed, well nourished, and in no acute distress.  Neuro/Psych: Alert and oriented x3, extra-ocular muscles intact, able to move all 4 extremities, sensation grossly intact. Skin: Warm and dry, no rashes noted.  Respiratory: Not using accessory muscles, speaking in full sentences, trachea midline.  Cardiovascular: Pulses palpable, no extremity edema. Abdomen: Does not appear distended. MSK:  Cervical spine: Normal-appearing. Tenderness to palpation over right trapezius. Cervical range of motion is intact pain located in the right trapezius with rotation and lateral flexion.  Right shoulder: Exam limited due to severe pain. Normal-appearing.  Tender to palpation right trapezius otherwise nontender. Passive abduction is full however active abduction significantly  limited to about 70 degrees. Passive external rotation is intact active limited to about 30 degrees beyond the neutral position. Internal rotation limited to lumbar spine. Intact strength testing to abduction external/internal rotation over this is limited by pain. Positive Hawkins and Neer's test.  Contralateral shoulder normal-appearing, non-tender, normal ROM, normal strength.  Pulses intact bilateral upper extremities.   Lab and Radiology Results No results found for this or any previous visit (from the past 72 hour(s)). Dg Cervical Spine Complete  Result Date: 02/04/2018 CLINICAL DATA:  Right lateral neck pain radiating to the shoulder and elbow for the past 2 months. EXAM: CERVICAL SPINE - COMPLETE 4+ VIEW COMPARISON:  None. FINDINGS: C1 to the superior endplate of T1 is imaged on the provided lateral radiograph. Normal alignment of the cervical spine. No anterolisthesis or retrolisthesis. The bilateral facets appear normally aligned. The dens is normally positioned between the lateral masses of C1. Cervical vertebral body heights are preserved. Note is made of a small peripherally corticated limbus body about the anterior aspect of the inferior endplate of the C5 vertebral body. Prevertebral soft tissues are normal. Cervical vertebral body heights appear preserved. The bilateral neural foramina appear widely patent given obliquity. Regional soft tissues appear normal. Limited visualization of the lung apices is normal. IMPRESSION: No explanation for patient's radiating right lateral neck pain. Electronically Signed   By: Sandi Mariscal M.D.   On: 02/04/2018 10:27   Dg Shoulder Right  Result Date: 02/04/2018 CLINICAL DATA:  Right shoulder pain radiating to the elbow  for the past 2 months. EXAM: RIGHT SHOULDER - 2+ VIEW COMPARISON:  None. FINDINGS: No fracture or dislocation. Glenohumeral and acromioclavicular joint spaces appear preserved given obliquity. No evidence of calcific tendinitis.  Regional soft tissues appear normal. Limited visualization of the adjacent thorax appears normal. IMPRESSION: No explanation for patient's radiating right shoulder pain. Electronically Signed   By: Sandi Mariscal M.D.   On: 02/04/2018 09:54    Procedure: Real-time Ultrasound Guided Injection of right subacromial injection  Device: GE Logiq E   Images permanently stored and available for review in the ultrasound unit. Verbal informed consent obtained.  Discussed risks and benefits of procedure. Warned about infection bleeding damage to structures skin hypopigmentation and fat atrophy among others. Patient expresses understanding and agreement Time-out conducted.   Noted no overlying erythema, induration, or other signs of local infection.   Skin prepped in a sterile fashion.   Local anesthesia: Topical Ethyl chloride.   With sterile technique and under real time ultrasound guidance:  40 mg of Kenalog and 2 mL of Marcaine injected easily.   Completed without difficulty   Pain immediately resolved suggesting accurate placement of the medication.   Advised to call if fevers/chills, erythema, induration, drainage, or persistent bleeding.   Images permanently stored and available for review in the ultrasound unit.  Impression: Technically successful ultrasound guided injection.       Assessment and Plan: 67 y.o. female with   Right shoulder pain: Ongoing now for a few weeks worsening recently.  Pain is very likely bursitis or impingement.  Plan for injection as above and home exercise program.  Recheck in 1 week if not improving next step would be formal physical therapy and moving towards MRI.  Recheck sooner if needed.    Orders Placed This Encounter  Procedures  . DG Shoulder Right    Standing Status:   Future    Number of Occurrences:   1    Standing Expiration Date:   04/07/2019    Order Specific Question:   Reason for Exam (SYMPTOM  OR DIAGNOSIS REQUIRED)    Answer:   right shoulder  pain    Order Specific Question:   Preferred imaging location?    Answer:   [facility]    Order Specific Question:   Radiology Contrast Protocol - do NOT remove file path    Answer:   \\[email or web address]  . DG Cervical Spine Complete    Standing Status:   Future    Number of Occurrences:   1    Standing Expiration Date:   04/07/2019    Order Specific Question:   Reason for Exam (SYMPTOM  OR DIAGNOSIS REQUIRED)    Answer:   right trap pain and c5 radiculopathy    Order Specific Question:   Preferred imaging location?    Answer:   [facility]    Order Specific Question:   Radiology Contrast Protocol - do NOT remove file path    Answer:   \\[email or web address]   No orders of the defined types were placed in this encounter.   Historical information moved to improve visibility of documentation.  Past Medical History:  Diagnosis Date  . Iron deficiency anemia 12/15/2011  . Leukocytosis 12/15/2011  . Osteopenia determined by x-ray 06/27/2017   March 2019 T score -2.3  . Pyelonephritis   . 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 VUR (vesicoureteric reflux) 11/30/2011   Past Surgical History:  Procedure Laterality Date  .  ABDOMINAL HYSTERECTOMY    . APPENDECTOMY    . CHOLECYSTECTOMY    . GASTRIC BYPASS    . KIDNEY SURGERY    . KNEE SURGERY     left x 2 right x 1  . TONSILLECTOMY     Social History   Tobacco Use  . Smoking status: Never Smoker  . Smokeless tobacco: Never Used  Substance Use Topics  . Alcohol use: No   family history includes Alzheimer's disease in her mother; COPD in her mother; Cancer in her brother and father; Diabetes in her sister; Heart attack in her brother, mother, and sister; Heart failure in her sister; Hypertension in her mother.  Medications: Current Outpatient Medications  Medication Sig Dispense Refill  . diclofenac  sodium (VOLTAREN) 1 % GEL Apply 4 g topically 4 (four) times daily. To affected joint. 100 g 11  . IRON PO Take by mouth.    . nitrofurantoin, macrocrystal-monohydrate, (MACROBID) 100 MG capsule Take 1 capsule (100 mg total) by mouth 2 (two) times daily. For 10days 20 capsule 0  . sertraline (ZOLOFT) 50 MG tablet Take 1 tablet (50 mg total) by mouth daily. 90 tablet 0   No current facility-administered medications for this visit.    Allergies  Allergen Reactions  . Ciprofloxacin     seizure  . Oxybutynin     seizure  . Sulfa Antibiotics     seizures      Discussed warning signs or symptoms. Please see discharge instructions. Patient expresses understanding.  I personally was present and performed or re-performed the history, physical exam and medical decision-making activities of this service and have verified that the service and findings are accurately documented in the student's note. ___________________________________________ Lynne Leader M.D., ABFM., CAQSM. Primary Care and Sports Medicine Adjunct Instructor of [employer] [facility] [employer] [employer]

## 2018-02-04 NOTE — Patient Instructions (Addendum)
Thank you for coming in today. Call or go to the ER if you develop a large red swollen joint with extreme pain or oozing puss.  Work on the shoulder motion.  Work on range of motion.  Recheck in 4 weeks.    Shoulder Impingement Syndrome Rehab Ask your health care provider which exercises are safe for you. Do exercises exactly as told by your health care provider and adjust them as directed. It is normal to feel mild stretching, pulling, tightness, or discomfort as you do these exercises, but you should stop right away if you feel sudden pain or your pain gets worse.Do not begin these exercises until told by your health care provider. Stretching and range of motion exercise This exercise warms up your muscles and joints and improves the movement and flexibility of your shoulder. This exercise also helps to relieve pain and stiffness. Exercise A: Passive horizontal adduction  1. Sit or stand and pull your left / right elbow across your chest, toward your other shoulder. Stop when you feel a gentle stretch in the back of your shoulder and upper arm. ? Keep your arm at shoulder height. ? Keep your arm as close to your body as you comfortably can. 2. Hold for __________ seconds. 3. Slowly return to the starting position. Repeat __________ times. Complete this exercise __________ times a day. Strengthening exercises These exercises build strength and endurance in your shoulder. Endurance is the ability to use your muscles for a long time, even after they get tired. Exercise B: External rotation, isometric 1. Stand or sit in a doorway, facing the door frame. 2. Bend your left / right elbow and place the back of your wrist against the door frame. Only your wrist should be touching the frame. Keep your upper arm at your side. 3. Gently press your wrist against the door frame, as if you are trying to push your arm away from your abdomen. ? Avoid shrugging your shoulder while you press your hand  against the door frame. Keep your shoulder blade tucked down toward the middle of your back. 4. Hold for __________ seconds. 5. Slowly release the tension, and relax your muscles completely before you do the exercise again. Repeat __________ times. Complete this exercise __________ times a day. Exercise C: Internal rotation, isometric  1. Stand or sit in a doorway, facing the door frame. 2. Bend your left / right elbow and place the inside of your wrist against the door frame. Only your wrist should be touching the frame. Keep your upper arm at your side. 3. Gently press your wrist against the door frame, as if you are trying to push your arm toward your abdomen. ? Avoid shrugging your shoulder while you press your hand against the door frame. Keep your shoulder blade tucked down toward the middle of your back. 4. Hold for __________ seconds. 5. Slowly release the tension, and relax your muscles completely before you do the exercise again. Repeat __________ times. Complete this exercise __________ times a day. Exercise D: Scapular protraction, supine  1. Lie on your back on a firm surface. Hold a __________ weight in your left / right hand. 2. Raise your left / right arm straight into the air so your hand is directly above your shoulder joint. 3. Push the weight into the air so your shoulder lifts off of the surface that you are lying on. Do not move your head, neck, or back. 4. Hold for __________ seconds. 5. Slowly return to  the starting position. Let your muscles relax completely before you repeat this exercise. Repeat __________ times. Complete this exercise __________ times a day. Exercise E: Scapular retraction  1. Sit in a stable chair without armrests, or stand. 2. Secure an exercise band to a stable object in front of you so the band is at shoulder height. 3. Hold one end of the exercise band in each hand. Your palms should face down. 4. Squeeze your shoulder blades together and  move your elbows slightly behind you. Do not shrug your shoulders while you do this. 5. Hold for __________ seconds. 6. Slowly return to the starting position. Repeat __________ times. Complete this exercise __________ times a day. Exercise F: Shoulder extension  1. Sit in a stable chair without armrests, or stand. 2. Secure an exercise band to a stable object in front of you where the band is above shoulder height. 3. Hold one end of the exercise band in each hand. 4. Straighten your elbows and lift your hands up to shoulder height. 5. Squeeze your shoulder blades together and pull your hands down to the sides of your thighs. Stop when your hands are straight down by your sides. Do not let your hands go behind your body. 6. Hold for __________ seconds. 7. Slowly return to the starting position. Repeat __________ times. Complete this exercise __________ times a day. This information is not intended to replace advice given to you by your health care provider. Make sure you discuss any questions you have with your health care provider. Document Released: 03/20/2005 Document Revised: 11/25/2015 Document Reviewed: 02/20/2015 Elsevier Interactive Patient Education  [employer].

## 2018-02-08 ENCOUNTER — Emergency Department (INDEPENDENT_AMBULATORY_CARE_PROVIDER_SITE_OTHER)
Admission: EM | Admit: 2018-02-08 | Discharge: 2018-02-08 | Disposition: A | Discharge status: Home / Self Care | Payer: Private Health Insurance - Indemnity | Source: Home / Self Care | Attending: Family Medicine | Admitting: Family Medicine

## 2018-02-08 ENCOUNTER — Other Ambulatory Visit: Payer: Self-pay

## 2018-02-08 DIAGNOSIS — J3489 Other specified disorders of nose and nasal sinuses: Secondary | ICD-10-CM | POA: Diagnosis not present

## 2018-02-08 MED ORDER — CLINDAMYCIN HCL 300 MG PO CAPS: ORAL_CAPSULE | ORAL | 0 refills | Status: DC

## 2018-02-08 NOTE — Discharge Instructions (Addendum)
May continue Aleve, 2 tabs every 12 hours as needed. May apply warm compress several times daily. If symptoms become significantly worse during the night or over the weekend, proceed to the local emergency room.

## 2018-02-08 NOTE — ED Triage Notes (Signed)
Pt stated that a pimple was at the base of the left side of her nose Monday night.  She squeezed it, and Tuesday it was sore inside.  Now she states that there is a pus filled pimple inside her nose with swelling of upper lip and lymph node swelling.

## 2018-02-08 NOTE — ED Provider Notes (Signed)
[facility]    CSN: [id number] Arrival date & time: 02/08/18  1331     History   Chief Complaint Chief Complaint  Patient presents with  . Abscess    HPI Selena May is a 67 y.o. female.   Patient reports that she noticed a small "pimple" at her left nares four days ago.  She squeezed it, and the next day she had soreness inside her left nares with purulent drainage.  She has developed persistent pain and swelling in her left upper lip and left nose.  No fevers, chills, and sweats.  The history is provided by the patient.    Past Medical History:  Diagnosis Date  . Iron deficiency anemia 12/15/2011  . Leukocytosis 12/15/2011  . Osteopenia determined by x-ray 06/27/2017   March 2019 T score -2.3  . Pyelonephritis   . 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 VUR (vesicoureteric reflux) 11/30/2011    Patient Active Problem List   Diagnosis Date Noted  . Elevated blood pressure reading 10/01/2017  . Osteopenia determined by x-ray 06/27/2017  . 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 Aortic atherosclerosis ([place]) 03/06/2017  . Encounter for examination of blood pressure with abnormal findings 04/05/2016  . Orthostatic hypotension 03/24/2016  . 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 Murmur, cardiac 03/24/2016  . Anemia 03/24/2016  . Dizziness 03/24/2016  . 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 Dysfunction of left eustachian tube 02/08/2015  . Vaginitis and vulvovaginitis 01/11/2015  . Chest pain 10/22/2014  . Depression, recurrent ([place]) 01/10/2012  . Obesity 12/21/2011  . Iron deficiency anemia 12/15/2011  . 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 Leukocytosis 12/15/2011  . 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 VUR (vesicoureteric reflux) 11/30/2011    Past Surgical History:  Procedure Laterality Date  . ABDOMINAL HYSTERECTOMY    .  APPENDECTOMY    . CHOLECYSTECTOMY    . GASTRIC BYPASS    . KIDNEY SURGERY    . KNEE SURGERY     left x 2 right x 1  . TONSILLECTOMY      OB History   None      Home Medications    Prior to Admission medications   Medication Sig Start Date End Date Taking? Authorizing Provider  clindamycin (CLEOCIN) 300 MG capsule Take one cap by mouth every 8 hours 02/08/18   Kandra Nicolas, MD  diclofenac sodium (VOLTAREN) 1 % GEL Apply 4 g topically 4 (four) times daily. To affected joint. 05/09/17   Gregor Hams, MD  IRON PO Take by mouth.    [provider]  nitrofurantoin, macrocrystal-monohydrate, (MACROBID) 100 MG capsule Take 1 capsule (100 mg total) by mouth 2 (two) times daily. For 10days 10/01/17   Breeback, Jade L, PA-C  sertraline (ZOLOFT) 50 MG tablet Take 1 tablet (50 mg total) by mouth daily. 10/01/17   Donella Stade, PA-C    Family History Family History  Problem Relation Age of Onset  . Alzheimer's disease Mother   . COPD Mother   . Hypertension Mother   . Heart attack Mother   . Cancer Father        Lung  . Heart attack Sister   . Diabetes Sister   . Heart failure Sister   . Heart attack Brother   . Cancer Brother        Lung  Social History Social History   Tobacco Use  . Smoking status: Never Smoker  . Smokeless tobacco: Never Used  Substance Use Topics  . Alcohol use: No  . Drug use: No     Allergies   Ciprofloxacin; Oxybutynin; and Sulfa antibiotics   Review of Systems Review of Systems  Constitutional: Negative for activity change, chills, diaphoresis, fatigue and fever.  HENT: Positive for facial swelling. Negative for nosebleeds.   Eyes: Negative.   Respiratory: Negative.   Cardiovascular: Negative.   Gastrointestinal: Negative.   Genitourinary: Negative.   Musculoskeletal: Negative.   Neurological: Negative for headaches.     Physical Exam Triage Vital Signs ED Triage Vitals  Enc Vitals Group     BP 02/08/18 1430 (!)  152/86     Pulse Rate 02/08/18 1430 75     Resp 02/08/18 1430 18     Temp 02/08/18 1430 98.3 F (36.8 C)     Temp Source 02/08/18 1430 Oral     SpO2 02/08/18 1430 98 %     Weight 02/08/18 1431 197 lb (89.4 kg)     Height 02/08/18 1431 5' 0.5" (1.537 m)     Head Circumference --      Peak Flow --      Pain Score --      Pain Loc --      Pain Edu? --      Excl. in [place]? --    No data found.  Updated Vital Signs BP (!) 152/86 (BP Location: Right Arm)   Pulse 75   Temp 98.3 F (36.8 C) (Oral)   Resp 18   Ht 5' 0.5" (1.537 m)   Wt 89.4 kg   SpO2 98%   BMI 37.84 kg/m   Visual Acuity Right Eye Distance:   Left Eye Distance:   Bilateral Distance:    Right Eye Near:   Left Eye Near:    Bilateral Near:     Physical Exam  Constitutional: She appears well-developed and well-nourished. No distress.  HENT:  Head: Normocephalic.  Right Ear: Tympanic membrane, external ear and ear canal normal.  Left Ear: Tympanic membrane, external ear and ear canal normal.  Nose:    Mouth/Throat: Oropharynx is clear and moist.  Philtrum indurated and tender to palpation, but not fluctuant.  Mild tenderness left nose.  Small amount of purulent discharge present left nares.  Eyes: Pupils are equal, round, and reactive to light. Conjunctivae are normal.  Neck: Neck supple.  Cardiovascular: Normal rate.  Pulmonary/Chest: Effort normal.  Lymphadenopathy:    She has no cervical adenopathy.  Neurological: She is alert.  Skin: Skin is warm and dry.  Nursing note and vitals reviewed.    UC Treatments / Results  Labs (all labs ordered are listed, but only abnormal results are displayed) Labs Reviewed  WOUND CULTURE    EKG None  Radiology No results found.  Procedures Procedures (including critical care time)  Medications Ordered in UC Medications - No data to display  Initial Impression / Assessment and Plan / UC Course  I have reviewed the triage vital signs and the nursing  notes.  Pertinent labs & imaging results that were available during my care of the patient were reviewed by me and considered in my medical decision making (see chart for details).    Wound culture pending.  Begin Clindamycin 300mg  TID. Followup with ENT if not improved 3 days.   Final Clinical Impressions(s) / UC Diagnoses   Final diagnoses:  Nasal vestibulitis     Discharge Instructions     May continue Aleve, 2 tabs every 12 hours as needed. May apply warm compress several times daily. If symptoms become significantly worse during the night or over the weekend, proceed to the local emergency room.     ED Prescriptions    Medication Sig Dispense Auth. Provider   clindamycin (CLEOCIN) 300 MG capsule Take one cap by mouth every 8 hours 30 capsule Kandra Nicolas, MD        Kandra Nicolas, MD 02/12/18 [phone]

## 2018-02-11 ENCOUNTER — Telehealth: Payer: Self-pay | Admitting: *Deleted

## 2018-02-11 LAB — WOUND CULTURE
MICRO NUMBER: 91348621
SPECIMEN QUALITY:: ADEQUATE

## 2018-02-11 NOTE — Telephone Encounter (Signed)
Callback: No answer, message to return call for labs.

## 2018-02-12 NOTE — Telephone Encounter (Signed)
No answer, left message to call back

## 2018-03-04 ENCOUNTER — Encounter: Payer: Self-pay | Admitting: Family Medicine

## 2018-03-04 ENCOUNTER — Ambulatory Visit (INDEPENDENT_AMBULATORY_CARE_PROVIDER_SITE_OTHER): Payer: Private Health Insurance - Indemnity | Admitting: Family Medicine

## 2018-03-04 VITALS — BP 149/67 | HR 67 | Wt 198.0 lb

## 2018-03-04 DIAGNOSIS — N390 Urinary tract infection, site not specified: Secondary | ICD-10-CM | POA: Diagnosis not present

## 2018-03-04 DIAGNOSIS — R300 Dysuria: Secondary | ICD-10-CM | POA: Diagnosis not present

## 2018-03-04 DIAGNOSIS — F339 Major depressive disorder, recurrent, unspecified: Secondary | ICD-10-CM | POA: Diagnosis not present

## 2018-03-04 DIAGNOSIS — N1370 Vesicoureteral-reflux, unspecified: Secondary | ICD-10-CM | POA: Diagnosis not present

## 2018-03-04 DIAGNOSIS — F325 Major depressive disorder, single episode, in full remission: Secondary | ICD-10-CM

## 2018-03-04 LAB — POCT URINALYSIS DIPSTICK
BILIRUBIN UA: NEGATIVE
Glucose, UA: NEGATIVE
KETONES UA: NEGATIVE
Nitrite, UA: POSITIVE
Protein, UA: POSITIVE — AB
Spec Grav, UA: 1.02 (ref 1.010–1.025)
UROBILINOGEN UA: 0.2 U/dL
pH, UA: 6 (ref 5.0–8.0)

## 2018-03-04 MED ORDER — CEPHALEXIN 500 MG PO CAPS: 500.0000 mg | ORAL_CAPSULE | Freq: Two times a day (BID) | ORAL | 0 refills | Status: DC

## 2018-03-04 MED ORDER — SERTRALINE HCL 50 MG PO TABS: 50.0000 mg | ORAL_TABLET | Freq: Every day | ORAL | 1 refills | Status: DC

## 2018-03-04 NOTE — Progress Notes (Signed)
Selena May is a 67 y.o. female who presents to [facility] [place] today for follow-up on shoulder pain and UTI.  Right shoulder: After her right shoulder injection on 11/04, Selena May reports that her shoulder is completely better. She demonstrates that she can move her arm overhead and behind her back. She continues to do the home exercises. She does note occasional pain in her left shoulder. However, this is not nearly as bad as her previous right shoulder pain and she is not concerned about it at this time.   UTI: Selena May reports that she has had a UTI for the past 2-3 days. She has had burning with urination and reports that her urine has a bad odor. She gets UTIs all the time as she has vesicoureteric reflux. She normally takes Keflex for 7 days for UTIs.  Medications: She also requests a refill of Zoloft.  She notes her depression is quite well controlled with Zoloft and she is happy with how things are going.   ROS as above:  Exam:  BP (!) 149/67   Pulse 67   Wt 198 lb (89.8 kg)   BMI 38.03 kg/m  Wt Readings from Last 5 Encounters:  03/04/18 198 lb (89.8 kg)  02/08/18 197 lb (89.4 kg)  02/04/18 201 lb (91.2 kg)  10/01/17 202 lb (91.6 kg)  06/13/17 203 lb (92.1 kg)    Gen: Well NAD HEENT: EOMI,  MMM Lungs: Normal work of breathing. CTABL Heart: RRR no MRG Abd: NABS, Soft. Nondistended, Nontender, No CVA tenderness. Exts: Brisk capillary refill, warm and well perfused.  MSK: Right and left shoulder: Normal ROM. No pain with ROM.  Normal abduction, flexion, internal and external rotation.  Pulses and capillary refill normal.  Psych alert and oriented normal speech thought process and affect.  Depression screen [facility] 2/9 03/04/2018 03/28/2017 03/28/2017  Decreased Interest 0 0 0  Down, Depressed, Hopeless 0 0 0  PHQ - 2 Score 0 0 0  Altered  sleeping 3 - -  Tired, decreased energy 3 - -  Change in appetite 2 - -  Feeling bad or failure about yourself  0 - -  Trouble concentrating 0 - -  Moving slowly or fidgety/restless 0 - -  Suicidal thoughts 0 - -  PHQ-9 Score 8 - -  Difficult doing work/chores Not difficult at all - -     Lab and Radiology Results Results for orders placed or performed in visit on 03/04/18 (from the past 72 hour(s))  POCT Urinalysis Dipstick     Status: Abnormal   Collection Time: 03/04/18  1:18 PM  Result Value Ref Range   Color, UA yellow    Clarity, UA clear    Glucose, UA Negative Negative   Bilirubin, UA negative    Ketones, UA negative    Spec Grav, UA 1.020 1.010 - 1.025   Blood, UA small    pH, UA 6.0 5.0 - 8.0   Protein, UA Positive (A) Negative   Urobilinogen, UA 0.2 0.2 or 1.0 E.U./dL   Nitrite, UA POSITIVE    Leukocytes, UA Large (3+) (A) Negative   Appearance     Odor         Assessment and Plan: 67 y.o. female with   Follow-up on right shoulder: Selena May is much improved since injection on 11/04. She has no complaints. Continue home exercises.   UTI: Urinalysis was positive for nitrite  and leukocytes. Urine culture pending. Prescribed Keflex for 10 days. Can stop at 7 days if better.   Depression: Her PHQ-9 score revealed that she is well-controlled on Zoloft. Refilled Zoloft for 6 months.     Orders Placed This Encounter  Procedures  . Urine Culture  . POCT Urinalysis Dipstick   Meds ordered this encounter  Medications  . sertraline (ZOLOFT) 50 MG tablet    Sig: Take 1 tablet (50 mg total) by mouth daily.    Dispense:  90 tablet    Refill:  1  . cephALEXin (KEFLEX) 500 MG capsule    Sig: Take 1 capsule (500 mg total) by mouth 2 (two) times daily.    Dispense:  20 capsule    Refill:  0    Historical information moved to improve visibility of documentation.  Past Medical History:  Diagnosis Date  . Iron deficiency anemia 12/15/2011  . Leukocytosis  12/15/2011  . Osteopenia determined by x-ray 06/27/2017   March 2019 T score -2.3  . Pyelonephritis   . 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 VUR (vesicoureteric reflux) 11/30/2011   Past Surgical History:  Procedure Laterality Date  . ABDOMINAL HYSTERECTOMY    . APPENDECTOMY    . CHOLECYSTECTOMY    . GASTRIC BYPASS    . KIDNEY SURGERY    . KNEE SURGERY     left x 2 right x 1  . TONSILLECTOMY     Social History   Tobacco Use  . Smoking status: Never Smoker  . Smokeless tobacco: Never Used  Substance Use Topics  . Alcohol use: No   family history includes Alzheimer's disease in her mother; COPD in her mother; Cancer in her brother and father; Diabetes in her sister; Heart attack in her brother, mother, and sister; Heart failure in her sister; Hypertension in her mother.  Medications: Current Outpatient Medications  Medication Sig Dispense Refill  . clindamycin (CLEOCIN) 300 MG capsule Take one cap by mouth every 8 hours 30 capsule 0  . diclofenac sodium (VOLTAREN) 1 % GEL Apply 4 g topically 4 (four) times daily. To affected joint. 100 g 11  . IRON PO Take by mouth.    . nitrofurantoin, macrocrystal-monohydrate, (MACROBID) 100 MG capsule Take 1 capsule (100 mg total) by mouth 2 (two) times daily. For 10days 20 capsule 0  . sertraline (ZOLOFT) 50 MG tablet Take 1 tablet (50 mg total) by mouth daily. 90 tablet 0   No current facility-administered medications for this visit.    Allergies  Allergen Reactions  . Ciprofloxacin     seizure  . Oxybutynin     seizure  . Sulfa Antibiotics     seizures     Discussed warning signs or symptoms. Please see discharge instructions. Patient expresses understanding.  I personally was present and performed or re-performed the history, physical exam and medical decision-making activities of this service and have verified that the service and findings are accurately documented in the student's  note. ___________________________________________ Lynne Leader M.D., ABFM., CAQSM. Primary Care and Sports Medicine Adjunct Instructor of [employer] [facility] [employer]

## 2018-03-04 NOTE — Patient Instructions (Addendum)
Thank you for coming in today. Let me know if you do not improve from a urinary treatment.  Recheck as needed.  Follow up with Dr Sheppard Coil as needed.    Ok to stop keflex at 7 days if all is well.   Continue home exercises.

## 2018-03-06 LAB — URINE CULTURE
MICRO NUMBER: 91439755
SPECIMEN QUALITY:: ADEQUATE

## 2018-04-04 ENCOUNTER — Other Ambulatory Visit: Payer: Self-pay | Admitting: Family Medicine

## 2018-05-20 ENCOUNTER — Ambulatory Visit (INDEPENDENT_AMBULATORY_CARE_PROVIDER_SITE_OTHER): Payer: Medicare Other | Admitting: Family Medicine

## 2018-05-20 ENCOUNTER — Encounter: Payer: Self-pay | Admitting: Family Medicine

## 2018-05-20 VITALS — BP 151/54 | HR 71 | Ht 61.5 in | Wt 199.0 lb

## 2018-05-20 DIAGNOSIS — G2581 Restless legs syndrome: Secondary | ICD-10-CM | POA: Diagnosis not present

## 2018-05-20 DIAGNOSIS — M25512 Pain in left shoulder: Secondary | ICD-10-CM | POA: Diagnosis not present

## 2018-05-20 DIAGNOSIS — Z1159 Encounter for screening for other viral diseases: Secondary | ICD-10-CM | POA: Diagnosis not present

## 2018-05-20 DIAGNOSIS — M25511 Pain in right shoulder: Secondary | ICD-10-CM

## 2018-05-20 DIAGNOSIS — Z6836 Body mass index (BMI) 36.0-36.9, adult: Secondary | ICD-10-CM

## 2018-05-20 DIAGNOSIS — E611 Iron deficiency: Secondary | ICD-10-CM | POA: Diagnosis not present

## 2018-05-20 MED ORDER — GABAPENTIN 300 MG PO CAPS: 300.0000 mg | ORAL_CAPSULE | Freq: Every day | ORAL | 3 refills | Status: DC

## 2018-05-20 NOTE — Patient Instructions (Addendum)
Thank you for coming in today. Get fasting labs in the near future.  Call or go to the ER if you develop a large red swollen joint with extreme pain or oozing puss.  Start gabapentin for restless leg syndrome.   Continue home exercises for shoulder pain.   Schedule physical with Dr Sheppard Coil in the near future.   Restless Legs Syndrome Restless legs syndrome is a condition that causes uncomfortable feelings or sensations in the legs, especially while sitting or lying down. The sensations usually cause an overwhelming urge to move the legs. The arms can also sometimes be affected. The condition can range from mild to severe. The symptoms often interfere with a person's ability to sleep. What are the causes? The cause of this condition is not known. What increases the risk? The following factors may make you more likely to develop this condition:  Being older than 50.  Pregnancy.  Being a woman. In general, the condition is more common in women than in men.  A family history of the condition.  Having iron deficiency.  Overuse of caffeine, nicotine, or alcohol.  Certain medical conditions, such as kidney disease, Parkinson's disease, or nerve damage.  Certain medicines, such as those for high blood pressure, nausea, colds, allergies, depression, and some heart conditions. What are the signs or symptoms? The main symptom of this condition is uncomfortable sensations in the legs, such as:  Pulling.  Tingling.  Prickling.  Throbbing.  Crawling.  Burning. Usually, the sensations:  Affect both sides of the body.  Are worse when you sit or lie down.  Are worse at night. These may wake you up or make it difficult to fall asleep.  Make you have a strong urge to move your legs.  Are temporarily relieved by moving your legs. The arms can also be affected, but this is rare. People who have this condition often have tiredness during the day because of their lack of sleep at  night. How is this diagnosed? This condition may be diagnosed based on:  Your symptoms.  Blood tests. In some cases, you may be monitored in a sleep lab by a specialist (a sleep study). This can detect any disruptions in your sleep. How is this treated? This condition is treated by managing the symptoms. This may include:  Lifestyle changes, such as exercising, using relaxation techniques, and avoiding caffeine, alcohol, or tobacco.  Medicines. Anti-seizure medicines may be tried first. Follow these instructions at home:     General instructions  Take over-the-counter and prescription medicines only as told by your health care provider.  Use methods to help relieve the uncomfortable sensations, such as: ? Massaging your legs. ? Walking or stretching. ? Taking a cold or hot bath.  Keep all follow-up visits as told by your health care provider. This is important. Lifestyle  Practice good sleep habits. For example, go to bed and get up at the same time every day. Most adults should get 7-9 hours of sleep each night.  Exercise regularly. Try to get at least 30 minutes of exercise most days of the week.  Practice ways of relaxing, such as yoga or meditation.  Avoid caffeine and alcohol.  Do not use any products that contain nicotine or tobacco, such as cigarettes and e-cigarettes. If you need help quitting, ask your health care provider. Contact a health care provider if:  Your symptoms get worse or they do not improve with treatment. Summary  Restless legs syndrome is a condition that  causes uncomfortable feelings or sensations in the legs, especially while sitting or lying down.  The symptoms often interfere with a person's ability to sleep.  This condition is treated by managing the symptoms. You may need to make lifestyle changes or take medicines. This information is not intended to replace advice given to you by your health care provider. Make sure you discuss any  questions you have with your health care provider. Document Released: 03/10/2002 Document Revised: 04/09/2017 Document Reviewed: 04/09/2017 Elsevier Interactive Patient Education  2019 [employer].

## 2018-05-20 NOTE — Progress Notes (Signed)
Selena May is a 68 y.o. female who presents to [facility] [place] today for bilateral shoulder pain and restless leg syndrome.  Selena May has a history of right-sided shoulder pain thought to be rotator cuff tendinopathy.  She had subacromial bursa steroid injection in November which worked until just recently.  Additionally she notes left-sided pain starting several months ago worsening over the past few weeks.  Pain is located in her upper arms bilaterally worse with overhead motion reaching back.  She denies any pain radiating below the level of the elbow.  No weakness or numbness or loss of function.  No injury.  She is tried over-the-counter medications which have helped only a little.  Additionally she notes a history of what she thinks is restless leg syndrome.  She notes a creepy crawly sensation in her legs bilaterally interfering with sleep.  The sensation is improved with motion.  She notes her mother had a similar problem.  She is never had full work-up for this issue yet.   ROS as above:  Exam:  BP (!) 151/54   May [birth year]   Ht 5' 1.5" (1.562 m)   Wt 199 lb (90.3 kg)   BMI 36.99 kg/m  Wt Readings from Last 5 Encounters:  05/20/18 199 lb (90.3 kg)  03/04/18 198 lb (89.8 kg)  02/08/18 197 lb (89.4 kg)  02/04/18 201 lb (91.2 kg)  10/01/17 202 lb (91.6 kg)    Gen: Well NAD HEENT: EOMI,  MMM Lungs: Normal work of breathing. CTABL Heart: RRR no MRG Abd: NABS, Soft. Nondistended, Nontender Exts: Brisk capillary refill, warm and well perfused.  C-spine: Nontender to spinal midline.  Normal cervical motion. Right shoulder normal-appearing nontender. Normal motion with the exception of abduction limited to 140 degrees with pain. Strength is intact. Positive empty can test Hawkins and Neer's test. Negative Yergason's and speeds test.  Left shoulder:  Normal-appearing nontender. Motion is normal with exception of abduction limited to about 150 degrees with pain. Strength is intact throughout. Positive empty can test Hawkins test and Neer's test. Negative Yergason's and speeds test.  Pulses capillary refill and sensation are intact distally.  Lab and Radiology Results Procedure: Real-time Ultrasound Guided Injection of right shoulder subacromial bursa Device: GE Logiq E   Images permanently stored and available for review in the ultrasound unit. Verbal informed consent obtained.  Discussed risks and benefits of procedure. Warned about infection bleeding damage to structures skin hypopigmentation and fat atrophy among others. Patient expresses understanding and agreement Time-out conducted.   Noted no overlying erythema, induration, or other signs of local infection.   Skin prepped in a sterile fashion.   Local anesthesia: Topical Ethyl chloride.   With sterile technique and under real time ultrasound guidance:  40 mg of Kenalog and 2 mL of Marcaine injected easily.   Completed without difficulty   Pain immediately resolved suggesting accurate placement of the medication.   Advised to call if fevers/chills, erythema, induration, drainage, or persistent bleeding.   Images permanently stored and available for review in the ultrasound unit.  Impression: Technically successful ultrasound guided injection.     Procedure: Real-time Ultrasound Guided Injection of left shoulder subacromial bursa Device: GE Logiq E   Images permanently stored and available for review in the ultrasound unit. Verbal informed consent obtained.  Discussed risks and benefits of procedure. Warned about infection bleeding damage to structures skin hypopigmentation and fat atrophy among others. Patient expresses  understanding and agreement Time-out conducted.   Noted no overlying erythema, induration, or other signs of local infection.   Skin prepped in a sterile  fashion.   Local anesthesia: Topical Ethyl chloride.   With sterile technique and under real time ultrasound guidance:  40 mg of Kenalog and 2 mL of Marcaine injected easily.   Completed without difficulty   Pain immediately resolved suggesting accurate placement of the medication.   Advised to call if fevers/chills, erythema, induration, drainage, or persistent bleeding.   Images permanently stored and available for review in the ultrasound unit.  Impression: Technically successful ultrasound guided injection.        Assessment and Plan: 68 y.o. female with  Bilateral shoulder pain due to rotator cuff tendinopathy versus subacromial bursitis. Plan for home exercise program as well as injection as above.  Recheck with me as needed.  May consider physical therapy in the future if not improving.  Lower extremity symptoms are very likely restless leg syndrome.  Plan for treatment with gabapentin.  Additionally patient is overdue for basic fasting labs.  Recommend schedule appointment with PCP for physical.  In the meantime we will check basic labs listed below.  We will also check iron stores given history of iron deficiency.  Iron deficiency can sometimes worsen restless leg syndrome.  Check with me as needed.  PDMP not reviewed this encounter. Orders Placed This Encounter  Procedures  . CBC  . COMPLETE METABOLIC PANEL WITH GFR  . Iron, TIBC and Ferritin Panel  . Hepatitis C antibody  . Lipid Panel w/reflex Direct LDL   Meds ordered this encounter  Medications  . gabapentin (NEURONTIN) 300 MG capsule    Sig: Take 1 capsule (300 mg total) by mouth at bedtime. For restless leg syndrome    Dispense:  90 capsule    Refill:  3     Historical information moved to improve visibility of documentation.  Past Medical History:  Diagnosis Date  . Iron deficiency anemia 12/15/2011  . Leukocytosis 12/15/2011  . Osteopenia determined by x-ray 06/27/2017   March 2019 T score -2.3  .  Pyelonephritis   . 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 VUR (vesicoureteric reflux) 11/30/2011   Past Surgical History:  Procedure Laterality Date  . ABDOMINAL HYSTERECTOMY    . APPENDECTOMY    . CHOLECYSTECTOMY    . GASTRIC BYPASS    . KIDNEY SURGERY    . KNEE SURGERY     left x 2 right x 1  . TONSILLECTOMY     Social History   Tobacco Use  . Smoking status: Never Smoker  . Smokeless tobacco: Never Used  Substance Use Topics  . Alcohol use: No   family history includes Alzheimer's disease in her mother; COPD in her mother; Cancer in her brother and father; Diabetes in her sister; Heart attack in her brother, mother, and sister; Heart failure in her sister; Hypertension in her mother.  Medications: Current Outpatient Medications  Medication Sig Dispense Refill  . diclofenac sodium (VOLTAREN) 1 % GEL Apply 4 g topically 4 (four) times daily. To affected joint. 100 g 11  . IRON PO Take by mouth.    . ondansetron (ZOFRAN-ODT) 4 MG disintegrating tablet DISSOLVE 1 TABLET BY MOUTH EVERY 6 HOURS 12 tablet 0  . sertraline (ZOLOFT) 50 MG tablet Take 1 tablet (50 mg total) by mouth daily. 90 tablet 1  . gabapentin (NEURONTIN) 300 MG capsule Take 1 capsule (  300 mg total) by mouth at bedtime. For restless leg syndrome 90 capsule 3   No current facility-administered medications for this visit.    Allergies  Allergen Reactions  . Ciprofloxacin     seizure  . Oxybutynin     seizure  . Sulfa Antibiotics     seizures     Discussed warning signs or symptoms. Please see discharge instructions. Patient expresses understanding.

## 2018-05-22 NOTE — Progress Notes (Deleted)
Subjective:   Selena May is a 68 y.o. female who presents for an Initial Medicare Annual Wellness Visit.  Review of Systems    No ROS.  Medicare Wellness Visit. Additional risk factors are reflected in the social history.     Sleep patterns:    Home Safety/Smoke Alarms: Feels safe in home. Smoke alarms in place.  Living environment;  Seat Belt Safety/Bike Helmet: Wears seat belt.   Female:   Pap- aged out      Haivana Nakya- utd      Dexa scan-  utd       CCS-      Objective:    There were no vitals filed for this visit. There is no height or weight on file to calculate BMI.  Advanced Directives 03/23/2017  Does Patient Have a Medical Advance Directive? No  Would patient like information on creating a medical advance directive? Yes (ED - Information included in AVS)    Current Medications (verified) Outpatient Encounter Medications as of 05/27/2018  Medication Sig  . diclofenac sodium (VOLTAREN) 1 % GEL Apply 4 g topically 4 (four) times daily. To affected joint.  Marland Kitchen gabapentin (NEURONTIN) 300 MG capsule Take 1 capsule (300 mg total) by mouth at bedtime. For restless leg syndrome  . IRON PO Take by mouth.  . ondansetron (ZOFRAN-ODT) 4 MG disintegrating tablet DISSOLVE 1 TABLET BY MOUTH EVERY 6 HOURS  . sertraline (ZOLOFT) 50 MG tablet Take 1 tablet (50 mg total) by mouth daily.   No facility-administered encounter medications on file as of 05/27/2018.     Allergies (verified) Ciprofloxacin; Oxybutynin; and Sulfa antibiotics   History: Past Medical History:  Diagnosis Date  . Iron deficiency anemia 12/15/2011  . Leukocytosis 12/15/2011  . Osteopenia determined by x-ray 06/27/2017   March 2019 T score -2.3  . Pyelonephritis   . 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 VUR (vesicoureteric reflux) 11/30/2011   Past Surgical History:  Procedure Laterality Date  . ABDOMINAL HYSTERECTOMY    . APPENDECTOMY    . CHOLECYSTECTOMY    .  GASTRIC BYPASS    . KIDNEY SURGERY    . KNEE SURGERY     left x 2 right x 1  . TONSILLECTOMY     Family History  Problem Relation Age of Onset  . Alzheimer's disease Mother   . COPD Mother   . Hypertension Mother   . Heart attack Mother   . Cancer Father        Lung  . Heart attack Sister   . Diabetes Sister   . Heart failure Sister   . Heart attack Brother   . Cancer Brother        Lung   Social History   Socioeconomic History  . Marital status: Widowed    Spouse name: Not on file  . Number of children: Not on file  . Years of education: Not on file  . Highest education level: Not on file  Occupational History  . Not on file  Social Needs  . Financial resource strain: Not on file  . Food insecurity:    Worry: Not on file    Inability: Not on file  . Transportation needs:    Medical: Not on file    Non-medical: Not on file  Tobacco Use  . Smoking status: Never Smoker  . Smokeless tobacco: Never Used  Substance and Sexual Activity  . Alcohol use: No  . Drug  use: No  . Sexual activity: Never  Lifestyle  . Physical activity:    Days per week: Not on file    Minutes per session: Not on file  . Stress: Not on file  Relationships  . Social connections:    Talks on phone: Not on file    Gets together: Not on file    Attends religious service: Not on file    Active member of club or organization: Not on file    Attends meetings of clubs or organizations: Not on file    Relationship status: Not on file  Other Topics Concern  . Not on file  Social History Narrative  . Not on file    Tobacco Counseling Counseling given: Not Answered   Clinical Intake:                        Activities of Daily Living No flowsheet data found.   Immunizations and Health Maintenance Immunization History  Administered Date(s) Administered  . Tdap 04/03/2013   Health Maintenance Due  Topic Date Due  . Hepatitis C Screening  [birth year]  . Fecal DNA  (Cologuard)  06/15/2001  . PNA vac Low Risk Adult (1 of 2 - PCV13) 06/15/2016    Patient Care Team: Emeterio Reeve, DO as PCP - General (Osteopathic Medicine)  Indicate any recent Medical Services you may have received from other than [facility] providers in the past year (date may be approximate).     Assessment:   This is a routine wellness examination for Selena May.Physical assessment deferred to PCP.   Hearing/Vision screen No exam data present  Dietary issues and exercise activities discussed:   Diet  Breakfast: Lunch:  Dinner:       Goals   None    Depression Screen PHQ 2/9 Scores 03/04/2018 03/28/2017 03/28/2017  PHQ - 2 Score 0 0 0  PHQ- 9 Score 8 - -    Fall Risk No flowsheet data found.  Is the patient's home free of loose throw rugs in walkways, pet beds, electrical cords, etc?   {Blank single:19197::"yes","no"}      Grab bars in the bathroom? {Blank single:19197::"yes","no"}      Handrails on the stairs?   {Blank single:19197::"yes","no"}      Adequate lighting?   {Blank single:19197::"yes","no"}   Cognitive Function:        Screening Tests Health Maintenance  Topic Date Due  . Hepatitis C Screening  [birth year]  . Fecal DNA (Cologuard)  06/15/2001  . PNA vac Low Risk Adult (1 of 2 - PCV13) 06/15/2016  . INFLUENZA VACCINE  12/03/2018 (Originally 11/01/2017)  . MAMMOGRAM  06/28/2019  . TETANUS/TDAP  04/04/2023  . DEXA SCAN  Completed      Plan:   ***  I have personally reviewed and noted the following in the patient's chart:   . Medical and social history . Use of alcohol, tobacco or illicit drugs  . Current medications and supplements . Functional ability and status . Nutritional status . Physical activity . Advanced directives . List of other physicians . Hospitalizations, surgeries, and ER visits in previous 12 months . Vitals . Screenings to include cognitive, depression, and falls . Referrals and appointments  In addition, I have  reviewed and discussed with patient certain preventive protocols, quality metrics, and best practice recommendations. A written personalized care plan for preventive services as well as general preventive health recommendations were provided to patient.     Selena Chars,  LPN   [id number]/83/[id number]

## 2018-05-27 ENCOUNTER — Ambulatory Visit: Payer: Private Health Insurance - Indemnity

## 2018-08-13 ENCOUNTER — Ambulatory Visit (INDEPENDENT_AMBULATORY_CARE_PROVIDER_SITE_OTHER): Payer: Medicare Other | Admitting: Family Medicine

## 2018-08-13 ENCOUNTER — Encounter: Payer: Self-pay | Admitting: Family Medicine

## 2018-08-13 VITALS — BP 137/84 | HR 98 | Temp 98.6°F | Wt 196.0 lb

## 2018-08-13 DIAGNOSIS — M25512 Pain in left shoulder: Secondary | ICD-10-CM

## 2018-08-13 DIAGNOSIS — M25511 Pain in right shoulder: Secondary | ICD-10-CM

## 2018-08-13 DIAGNOSIS — R110 Nausea: Secondary | ICD-10-CM | POA: Diagnosis not present

## 2018-08-13 DIAGNOSIS — F5101 Primary insomnia: Secondary | ICD-10-CM | POA: Diagnosis not present

## 2018-08-13 MED ORDER — TRAZODONE HCL 50 MG PO TABS: 25.0000 mg | ORAL_TABLET | Freq: Every evening | ORAL | 3 refills | Status: DC | PRN

## 2018-08-13 MED ORDER — ONDANSETRON 8 MG PO TBDP: 8.0000 mg | ORAL_TABLET | Freq: Three times a day (TID) | ORAL | 3 refills | Status: DC | PRN

## 2018-08-13 NOTE — Patient Instructions (Addendum)
Thank you for coming in today. Call or go to the ER if you develop a large red swollen joint with extreme pain or oozing puss.  If not better we can consider PT.   Keep me updated and we will recheck as needed.   Try trazodone for insomnia and zofran for nausea/.   If not improving schedule video or phone visit with Dr Sheppard Coil.   Trazodone tablets What is this medicine? TRAZODONE (TRAZ oh done) is used to treat depression. This medicine may be used for other purposes; ask your health care provider or pharmacist if you have questions. COMMON BRAND NAME(S): Desyrel What should I tell my health care provider before I take this medicine? They need to know if you have any of these conditions: -attempted suicide or thinking about it -bipolar disorder -bleeding problems -glaucoma -heart disease, or previous heart attack -irregular heart beat -kidney or liver disease -low levels of sodium in the blood -an unusual or allergic reaction to trazodone, other medicines, foods, dyes or preservatives -pregnant or trying to get pregnant -breast-feeding How should I use this medicine? Take this medicine by mouth with a glass of water. Follow the directions on the prescription label. Take this medicine shortly after a meal or a light snack. Take your medicine at regular intervals. Do not take your medicine more often than directed. Do not stop taking this medicine suddenly except upon the advice of your doctor. Stopping this medicine too quickly may cause serious side effects or your condition may worsen. A special MedGuide will be given to you by the pharmacist with each prescription and refill. Be sure to read this information carefully each time. Talk to your pediatrician regarding the use of this medicine in children. Special care may be needed. Overdosage: If you think you have taken too much of this medicine contact a poison control center or emergency room at once. NOTE: This medicine is only  for you. Do not share this medicine with others. What if I miss a dose? If you miss a dose, take it as soon as you can. If it is almost time for your next dose, take only that dose. Do not take double or extra doses. What may interact with this medicine? Do not take this medicine with any of the following medications: -certain medicines for fungal infections like fluconazole, itraconazole, ketoconazole, posaconazole, voriconazole -cisapride -dofetilide -dronedarone -linezolid -MAOIs like Carbex, Eldepryl, Marplan, Nardil, and Parnate -mesoridazine -methylene blue (injected into a vein) -pimozide -saquinavir -thioridazine This medicine may also interact with the following medications: -alcohol -antiviral medicines for HIV or AIDS -aspirin and aspirin-like medicines -barbiturates like phenobarbital -certain medicines for blood pressure, heart disease, irregular heart beat -certain medicines for depression, anxiety, or psychotic disturbances -certain medicines for migraine headache like almotriptan, eletriptan, frovatriptan, naratriptan, rizatriptan, sumatriptan, zolmitriptan -certain medicines for seizures like carbamazepine and phenytoin -certain medicines for sleep -certain medicines that treat or prevent blood clots like dalteparin, enoxaparin, warfarin -digoxin -fentanyl -lithium -NSAIDS, medicines for pain and inflammation, like ibuprofen or naproxen -other medicines that prolong the QT interval (cause an abnormal heart rhythm) -rasagiline -supplements like St. John's wort, kava kava, valerian -tramadol -tryptophan This list may not describe all possible interactions. Give your health care provider a list of all the medicines, herbs, non-prescription drugs, or dietary supplements you use. Also tell them if you smoke, drink alcohol, or use illegal drugs. Some items may interact with your medicine. What should I watch for while using this medicine? Tell your doctor  if your  symptoms do not get better or if they get worse. Visit your doctor or health care professional for regular checks on your progress. Because it may take several weeks to see the full effects of this medicine, it is important to continue your treatment as prescribed by your doctor. Patients and their families should watch out for new or worsening thoughts of suicide or depression. Also watch out for sudden changes in feelings such as feeling anxious, agitated, panicky, irritable, hostile, aggressive, impulsive, severely restless, overly excited and hyperactive, or not being able to sleep. If this happens, especially at the beginning of treatment or after a change in dose, call your health care professional. Dennis Bast may get drowsy or dizzy. Do not drive, use machinery, or do anything that needs mental alertness until you know how this medicine affects you. Do not stand or sit up quickly, especially if you are an older patient. This reduces the risk of dizzy or fainting spells. Alcohol may interfere with the effect of this medicine. Avoid alcoholic drinks. This medicine may cause dry eyes and blurred vision. If you wear contact lenses you may feel some discomfort. Lubricating drops may help. See your eye doctor if the problem does not go away or is severe. Your mouth may get dry. Chewing sugarless gum, sucking hard candy and drinking plenty of water may help. Contact your doctor if the problem does not go away or is severe. What side effects may I notice from receiving this medicine? Side effects that you should report to your doctor or health care professional as soon as possible: -allergic reactions like skin rash, itching or hives, swelling of the face, lips, or tongue -elevated mood, decreased need for sleep, racing thoughts, impulsive behavior -confusion -fast, irregular heartbeat -feeling faint or lightheaded, falls -feeling agitated, angry, or irritable -loss of balance or coordination -painful or  prolonged erections -restlessness, pacing, inability to keep still -suicidal thoughts or other mood changes -tremors -trouble sleeping -seizures -unusual bleeding or bruising Side effects that usually do not require medical attention (report to your doctor or health care professional if they continue or are bothersome): -change in sex drive or performance -change in appetite or weight -constipation -headache -muscle aches or pains -nausea This list may not describe all possible side effects. Call your doctor for medical advice about side effects. You may report side effects to FDA at 1-800-FDA-1088. Where should I keep my medicine? Keep out of the reach of children. Store at room temperature between 15 and 30 degrees C (59 to 86 degrees F). Protect from light. Keep container tightly closed. Throw away any unused medicine after the expiration date. NOTE: This sheet is a summary. It may not cover all possible information. If you have questions about this medicine, talk to your doctor, pharmacist, or health care provider.  2019 Elsevier/Gold Standard (2017-05-29 17:51:24)

## 2018-08-13 NOTE — Progress Notes (Signed)
Selena May is a 68 y.o. female who presents to [place] today for bilateral shoulder pain.  Shoulder pain.  Patient has had ongoing bilateral shoulder pain.  She was last seen February 17 for this issue and had bilateral subacromial bursa injection then.  She was asked to continue home exercise program and consider formal physical therapy.  She notes the injection lasted up until just a few weeks ago.  She notes her left is worse than her right.  She notes pain with overhead motion reaching back and with sleep.   Nausea: Patient notes mild nausea ongoing for the last several months.  She notes that she does not vomit.  Occasionally she has had benefit with Zofran for this in the past and she like to try that again if possible.  She denies any weight loss fevers chills constipation or diarrhea.   Insomnia: She also notes bothersome insomnia.  She has been taking Aleve PM and melatonin which has not helped much.  She has difficulty falling asleep and staying asleep.  ROS:  As above  Exam:  BP 137/84   Pulse 98   Temp 98.6 F (37 C) (Oral)   Wt 196 lb (88.9 kg)   BMI 36.43 kg/m  Wt Readings from Last 5 Encounters:  08/13/18 196 lb (88.9 kg)  05/20/18 199 lb (90.3 kg)  03/04/18 198 lb (89.8 kg)  02/08/18 197 lb (89.4 kg)  02/04/18 201 lb (91.2 kg)   General: Well Developed, well nourished, and in no acute distress.  Neuro/Psych: Alert and oriented x3, extra-ocular muscles intact, able to move all 4 extremities, sensation grossly intact. Skin: Warm and dry, no rashes noted.  Respiratory: Not using accessory muscles, speaking in full sentences, trachea midline.  Cardiovascular: Pulses palpable, no extremity edema. Abdomen: Does not appear distended. MSK:  C-spine nontender to spinal midline.  Normal cervical motion. Right shoulder normal-appearing.  Nontender Range of motion abduction limited to 120 degrees. Internal rotation  limited to lumbar spine. Normal external rotation. Positive Hawkins and Neer's test.  Positive empty can test. Intact strength pain with abduction.  Left shoulder normal-appearing.  Nontender. Range of motion abduction limited to 120 degrees.  Internal rotation limited lumbar spine. Normal external rotation. Positive Hawkins and Neer's test. Positive empty can test.   Intact strength however pain with abduction.    Lab and Radiology Results Procedure: Real-time Ultrasound Guided Injection of left subacromial bursa Device: GE Logiq E   Images permanently stored and available for review in the ultrasound unit. Verbal informed consent obtained.  Discussed risks and benefits of procedure. Warned about infection bleeding damage to structures skin hypopigmentation and fat atrophy among others. Patient expresses understanding and agreement Time-out conducted.   Noted no overlying erythema, induration, or other signs of local infection.   Skin prepped in a sterile fashion.   Local anesthesia: Topical Ethyl chloride.   With sterile technique and under real time ultrasound guidance:  40 mg of Kenalog and 2 mL of Marcaine injected easily.   Completed without difficulty   Pain immediately resolved suggesting accurate placement of the medication.   Advised to call if fevers/chills, erythema, induration, drainage, or persistent bleeding.   Images permanently stored and available for review in the ultrasound unit.  Impression: Technically successful ultrasound guided injection.     Procedure: Real-time Ultrasound Guided Injection of right subacromial bursa device: GE Logiq E   Images permanently stored and available for review in the ultrasound  unit. Verbal informed consent obtained.  Discussed risks and benefits of procedure. Warned about infection bleeding damage to structures skin hypopigmentation and fat atrophy among others. Patient expresses understanding and agreement Time-out  conducted.   Noted no overlying erythema, induration, or other signs of local infection.   Skin prepped in a sterile fashion.   Local anesthesia: Topical Ethyl chloride.   With sterile technique and under real time ultrasound guidance:  40 mg of Kenalog and 2 mL of Marcaine injected easily.   Completed without difficulty   Pain immediately resolved suggesting accurate placement of the medication.   Advised to call if fevers/chills, erythema, induration, drainage, or persistent bleeding.   Images permanently stored and available for review in the ultrasound unit.  Impression: Technically successful ultrasound guided injection.    Assessment and Plan: 68 y.o. female with bilateral shoulder pain.  Likely due to subacromial bursitis versus rotator cuff tendinopathy.  Status post injection today.  Plan to continue home exercise program.  If not better next step would be physical therapy.  Consider advanced imaging also in future if not improving.  Nausea: Unclear etiology.  Patient has had some work-up for this in the past.  Plan to use trial of Zofran.  If not improving follow-up with PCP for further evaluation management.  Insomnia: Discussed sleep hygiene.  Additionally will prescribe trazodone.  Follow-up with PCP if not improving.   PDMP not reviewed this encounter. No orders of the defined types were placed in this encounter.  Meds ordered this encounter  Medications  . ondansetron (ZOFRAN-ODT) 8 MG disintegrating tablet    Sig: Take 1 tablet (8 mg total) by mouth every 8 (eight) hours as needed for nausea.    Dispense:  20 tablet    Refill:  3  . traZODone (DESYREL) 50 MG tablet    Sig: Take 0.5-1 tablets (25-50 mg total) by mouth at bedtime as needed for sleep.    Dispense:  30 tablet    Refill:  3    Historical information moved to improve visibility of documentation.  Past Medical History:  Diagnosis Date  . Iron deficiency anemia 12/15/2011  . Leukocytosis 12/15/2011  .  Osteopenia determined by x-ray 06/27/2017   March 2019 T score -2.3  . Pyelonephritis   . 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 VUR (vesicoureteric reflux) 11/30/2011   Past Surgical History:  Procedure Laterality Date  . ABDOMINAL HYSTERECTOMY    . APPENDECTOMY    . CHOLECYSTECTOMY    . GASTRIC BYPASS    . KIDNEY SURGERY    . KNEE SURGERY     left x 2 right x 1  . TONSILLECTOMY     Social History   Tobacco Use  . Smoking status: Never Smoker  . Smokeless tobacco: Never Used  Substance Use Topics  . Alcohol use: No   family history includes Alzheimer's disease in her mother; COPD in her mother; Cancer in her brother and father; Diabetes in her sister; Heart attack in her brother, mother, and sister; Heart failure in her sister; Hypertension in her mother.  Medications: Current Outpatient Medications  Medication Sig Dispense Refill  . diclofenac sodium (VOLTAREN) 1 % GEL Apply 4 g topically 4 (four) times daily. To affected joint. 100 g 11  . gabapentin (NEURONTIN) 300 MG capsule Take 1 capsule (300 mg total) by mouth at bedtime. For restless leg syndrome 90 capsule 3  . IRON PO Take by mouth.    Marland Kitchen  sertraline (ZOLOFT) 50 MG tablet Take 1 tablet (50 mg total) by mouth daily. 90 tablet 1  . ondansetron (ZOFRAN-ODT) 8 MG disintegrating tablet Take 1 tablet (8 mg total) by mouth every 8 (eight) hours as needed for nausea. 20 tablet 3  . traZODone (DESYREL) 50 MG tablet Take 0.5-1 tablets (25-50 mg total) by mouth at bedtime as needed for sleep. 30 tablet 3   No current facility-administered medications for this visit.    Allergies  Allergen Reactions  . Ciprofloxacin     seizure  . Oxybutynin     seizure  . Sulfa Antibiotics     seizures      Discussed warning signs or symptoms. Please see discharge instructions. Patient expresses understanding.

## 2018-09-30 ENCOUNTER — Other Ambulatory Visit: Payer: Self-pay | Admitting: Family Medicine

## 2018-09-30 DIAGNOSIS — F339 Major depressive disorder, recurrent, unspecified: Secondary | ICD-10-CM

## 2018-11-01 ENCOUNTER — Other Ambulatory Visit: Payer: Self-pay | Admitting: Osteopathic Medicine

## 2018-11-01 DIAGNOSIS — F339 Major depressive disorder, recurrent, unspecified: Secondary | ICD-10-CM

## 2018-11-01 NOTE — Telephone Encounter (Signed)
Requested medications are due for refill today?  Yes  Requested medications are on the active medication list?  Yes  Last refill 09/30/2018  Future visit scheduled?  No  Notes to clinic  Patient has not been seen by PCP since 03/2017  Requested Prescriptions  Pending Prescriptions Disp Refills   sertraline (ZOLOFT) 50 MG tablet [Pharmacy Med Name: SERTRALINE HCL 50 MG TAB] 30 tablet 0    Sig: TAKE 1 TABLET BY MOUTH EVERY DAY     Psychiatry:  Antidepressants - SSRI Passed - 11/01/2018  8:48 AM      Passed - Valid encounter within last 6 months    Recent Outpatient Visits          2 months ago Acute pain of right shoulder   [facility], Rebekah [place], MD   5 months ago Acute pain of right shoulder   [facility], Rebekah [place], MD   8 months ago [facility], Rebekah [place], MD   9 months ago Acute pain of right shoulder   [facility], Rebekah [place], MD   1 year ago Acute cystitis with hematuria   [facility], [employer], PA-C             Passed - Completed PHQ-2 or PHQ-9 in the last 360 days.

## 2018-12-05 ENCOUNTER — Other Ambulatory Visit: Payer: Self-pay

## 2018-12-05 ENCOUNTER — Ambulatory Visit (INDEPENDENT_AMBULATORY_CARE_PROVIDER_SITE_OTHER): Payer: Medicare Other | Admitting: Family Medicine

## 2018-12-05 ENCOUNTER — Encounter: Payer: Self-pay | Admitting: Family Medicine

## 2018-12-05 VITALS — BP 145/72 | HR 76 | Temp 98.2°F | Wt 197.0 lb

## 2018-12-05 DIAGNOSIS — M25511 Pain in right shoulder: Secondary | ICD-10-CM

## 2018-12-05 DIAGNOSIS — M25512 Pain in left shoulder: Secondary | ICD-10-CM

## 2018-12-05 NOTE — Progress Notes (Signed)
Selena May is a 68 y.o. female who presents to [place] today for bilateral shoulder pain.  Patient has been seen previously for bilateral shoulder pain.  She had bilateral subacromial injection February 17 and again on May 12.    She notes that the pain returned about a month after the last shot.  She notes the right shoulder is worse than the left.  Pain is located in the lateral upper arm bilateral shoulders.  Right shoulder pain radiates to the elbow but not below the elbow.  Pain is worse with overhead motion reaching back bilaterally.  Pain is moderate to severe at times.  Pain sometimes interferes with activities at home such as dressing and bathing.  She is tried over-the-counter medications with little benefit.   She notes that she is getting married on November 14.  Patient declines flu and pneumonia vaccine today.    ROS:  As above  Exam:  BP (!) 145/72   Pulse 76   Temp 98.2 F (36.8 C) (Oral)   Wt 197 lb (89.4 kg)   BMI 36.62 kg/m  Wt Readings from Last 5 Encounters:  12/05/18 197 lb (89.4 kg)  08/13/18 196 lb (88.9 kg)  05/20/18 199 lb (90.3 kg)  03/04/18 198 lb (89.8 kg)  02/08/18 197 lb (89.4 kg)   General: Well Developed, well nourished, and in no acute distress.  Neuro/Psych: Alert and oriented x3, extra-ocular muscles intact, able to move all 4 extremities, sensation grossly intact. Skin: Warm and dry, no rashes noted.  Respiratory: Not using accessory muscles, speaking in full sentences, trachea midline.  Cardiovascular: Pulses palpable, no extremity edema. Abdomen: Does not appear distended. MSK: Right shoulder normal-appearing nontender. Decreased range of motion.  Normal external rotation.  Internal rotation limited to lumbar spine.  Abduction limited to 100 degrees. Strength is intact. Positive Hawkins and Neer's test.  Left shoulder: Normal-appearing not particularly tender. Decreased range of  motion with normal external rotation.  Internal rotation limited to lumbar spine.  Abduction limited to 130 degrees. Strength is intact. Positive Hawkins and Neer's test.  Pulses cap refill and sensation are intact bilateral upper extremities.    Lab and Radiology Results No results found for this or any previous visit (from the past 72 hour(s)). No results found.   EXAM: RIGHT SHOULDER - 2+ VIEW  COMPARISON:  None.  FINDINGS: No fracture or dislocation. Glenohumeral and acromioclavicular joint spaces appear preserved given obliquity. No evidence of calcific tendinitis. Regional soft tissues appear normal. Limited visualization of the adjacent thorax appears normal.  IMPRESSION: No explanation for patient's radiating right shoulder pain.   Electronically Signed   By: Sandi Mariscal M.D.   On: 02/04/2018 09:54  I personally (independently) visualized and performed the interpretation of the images attached in this note.  Procedure: Real-time Ultrasound Guided Injection of right subacromial bursa Device: GE Logiq E   Images permanently stored and available for review in the ultrasound unit. Verbal informed consent obtained.  Discussed risks and benefits of procedure. Warned about infection bleeding damage to structures skin hypopigmentation and fat atrophy among others. Patient expresses understanding and agreement Time-out conducted.   Noted no overlying erythema, induration, or other signs of local infection.   Skin prepped in a sterile fashion.   Local anesthesia: Topical Ethyl chloride.   With sterile technique and under real time ultrasound guidance:  40 mg of Kenalog and 2 mL of Marcaine injected easily.   Completed without difficulty  Pain immediately resolved suggesting accurate placement of the medication.   Advised to call if fevers/chills, erythema, induration, drainage, or persistent bleeding.   Images permanently stored and available for review in the  ultrasound unit.  Impression: Technically successful ultrasound guided injection.     Procedure: Real-time Ultrasound Guided Injection of left subacromial bursa Device: GE Logiq E   Images permanently stored and available for review in the ultrasound unit. Verbal informed consent obtained.  Discussed risks and benefits of procedure. Warned about infection bleeding damage to structures skin hypopigmentation and fat atrophy among others. Patient expresses understanding and agreement Time-out conducted.   Noted no overlying erythema, induration, or other signs of local infection.   Skin prepped in a sterile fashion.   Local anesthesia: Topical Ethyl chloride.   With sterile technique and under real time ultrasound guidance:  40 mg of Kenalog and 2 mL of Marcaine injected easily.   Completed without difficulty   Pain immediately resolved suggesting accurate placement of the medication.   Advised to call if fevers/chills, erythema, induration, drainage, or persistent bleeding.   Images permanently stored and available for review in the ultrasound unit.  Impression: Technically successful ultrasound guided injection.      Assessment and Plan: 68 y.o. female with bilateral shoulder pain right worse than left.  Patient did not have long-lasting response to injection at the last visit in May.  Injection was repeated today bilateral shoulders however if the injections do not provide any kind of lasting benefit it may be reasonable to proceed for surgical planning.  Neck step would likely be repeat x-ray shoulder and MRI.  Of note her wedding in November will probably delay any potential surgery.  Would be happy to inject her shoulder prior to the wedding in November if she is having a lot of pain.  Recheck as needed.  Informed patient transitioning to sports medicine only in [place] in November.  Happy to see her at that location or offered my partner Dr. Dianah Field at the [facility]  location.  Influenza and pneumonia vaccines declined.  PDMP not reviewed this encounter. No orders of the defined types were placed in this encounter.  No orders of the defined types were placed in this encounter.   Historical information moved to improve visibility of documentation.  Past Medical History:  Diagnosis Date  . Iron deficiency anemia 12/15/2011  . Leukocytosis 12/15/2011  . Osteopenia determined by x-ray 06/27/2017   March 2019 T score -2.3  . Pyelonephritis   . 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 VUR (vesicoureteric reflux) 11/30/2011   Past Surgical History:  Procedure Laterality Date  . ABDOMINAL HYSTERECTOMY    . APPENDECTOMY    . CHOLECYSTECTOMY    . GASTRIC BYPASS    . KIDNEY SURGERY    . KNEE SURGERY     left x 2 right x 1  . TONSILLECTOMY     Social History   Tobacco Use  . Smoking status: Never Smoker  . Smokeless tobacco: Never Used  Substance Use Topics  . Alcohol use: No   family history includes Alzheimer's disease in her mother; COPD in her mother; Cancer in her brother and father; Diabetes in her sister; Heart attack in her brother, mother, and sister; Heart failure in her sister; Hypertension in her mother.  Medications: Current Outpatient Medications  Medication Sig Dispense Refill  . diclofenac sodium (VOLTAREN) 1 % GEL Apply 4 g topically 4 (four) times daily. To  affected joint. 100 g 11  . gabapentin (NEURONTIN) 300 MG capsule Take 1 capsule (300 mg total) by mouth at bedtime. For restless leg syndrome 90 capsule 3  . IRON PO Take by mouth.    . ondansetron (ZOFRAN-ODT) 8 MG disintegrating tablet Take 1 tablet (8 mg total) by mouth every 8 (eight) hours as needed for nausea. 20 tablet 3  . sertraline (ZOLOFT) 50 MG tablet TAKE 1 TABLET BY MOUTH EVERY DAY 30 tablet 0  . traZODone (DESYREL) 50 MG tablet Take 0.5-1 tablets (25-50 mg total) by mouth at bedtime as needed for sleep. 30 tablet 3   No  current facility-administered medications for this visit.    Allergies  Allergen Reactions  . Ciprofloxacin     seizure  . Oxybutynin     seizure  . Sulfa Antibiotics     seizures      Discussed warning signs or symptoms. Please see discharge instructions. Patient expresses understanding.

## 2018-12-05 NOTE — Patient Instructions (Addendum)
Thank you for coming in today. Call or go to the ER if you develop a large red swollen joint with extreme pain or oozing puss.  If not a lot better let me know.  If the pain returns bad prior to your wedding in November we can do the shot a little earlier.   Keep me updated.   Consider pneumonia shot soon with Dr Sheppard Coil.   I will be moving to full time Sports Medicine in [place] starting on November 1st.  You will still be able to see me for your Sports Medicine or Orthopedic needs at [facility] in [place]. I will still be part of [facility].    If you want to stay locally for your Sports Medicine issues Dr. Dianah Field here in [place] will be happy to see you.  Additionally Dr. Clearance Coots at [facility] will be happy to see you for sports medicine issues more locally.   For your primary care needs you are welcome to establish care with Dr. Emeterio Reeve.  We are working quickly to hire more physicians to cover the primary care needs however if you cannot get an appointment with Dr. Sheppard Coil in a timely manner [facility] has locations and openings for primary care services nearby.   [facility] at [facility] [address] . [employer] , [place]: [phone] . Behavioral Medicine: [phone] . Fax: [place] at [employer] [address] . [place], [place]: [phone] . Behavioral Medicine: [phone] . Fax: [phone] . Hours (M-F): 7am - Academic librarian At [facility]. [place], [place]: [phone] . Behavioral Medicine: [phone] . Fax: [phone] . Hours (M-F): 8am - Optician, dispensing at [employer] . [place], [place] Phone: [phone] . Behavioral Medicine: [phone] . Fax: [phone]

## 2019-01-16 ENCOUNTER — Other Ambulatory Visit: Payer: Self-pay

## 2019-01-16 ENCOUNTER — Ambulatory Visit (INDEPENDENT_AMBULATORY_CARE_PROVIDER_SITE_OTHER): Payer: Medicare Other

## 2019-01-16 ENCOUNTER — Ambulatory Visit (INDEPENDENT_AMBULATORY_CARE_PROVIDER_SITE_OTHER): Payer: Medicare Other | Admitting: Family Medicine

## 2019-01-16 VITALS — HR 66

## 2019-01-16 DIAGNOSIS — M25511 Pain in right shoulder: Secondary | ICD-10-CM

## 2019-01-16 MED ORDER — HYDROCODONE-ACETAMINOPHEN 5-325 MG PO TABS: 1.0000 | ORAL_TABLET | Freq: Four times a day (QID) | ORAL | 0 refills | Status: DC | PRN

## 2019-01-16 NOTE — Patient Instructions (Signed)
Thank you for coming in today. .Get xray and then MRI.  Use norco for severe pain.  I will contact you  With results.

## 2019-01-16 NOTE — Progress Notes (Signed)
Selena May is a 68 y.o. female who presents to [facility] today for right shoulder pain.  Patient has a history of bothersome right shoulder pain ongoing for about a year now.  She has been seen several times for this and has had repeated subacromial injections.  These reports were read reasonably well lasting about 3 months or longer in between shots.  She was last seen on September 3 for this and received bilateral subacromial injections.  She notes her left shoulder has improved dramatically over her right shoulder is still hurting quite a bit.  She notes that shot only lasted a few days.  She notes bothersome pain in her right shoulder and located in the right upper arm.  Pain is worse with overhead motion reaching back.  She notes pain is worse at night as well.  She is tried home exercise programs.  She denies any radiating pain weakness numbness below the level of the elbow.  ROS:  As above  Exam:  Pulse 66  Wt Readings from Last 5 Encounters:  12/05/18 197 lb (89.4 kg)  08/13/18 196 lb (88.9 kg)  05/20/18 199 lb (90.3 kg)  03/04/18 198 lb (89.8 kg)  02/08/18 197 lb (89.4 kg)   General: Well Developed, well nourished, and in no acute distress.  Neuro/Psych: Alert and oriented x3, extra-ocular muscles intact, able to move all 4 extremities, sensation grossly intact. Skin: Warm and dry, no rashes noted.  Respiratory: Not using accessory muscles, speaking in full sentences, trachea midline.  Cardiovascular: Pulses palpable, no extremity edema. Abdomen: Does not appear distended. MSK:  Right shoulder normal-appearing nontender.  Range of motion: Abduction limited to 110 degrees active.  140 degrees passive. External rotation: Full Internal rotation: Limited to lumbar spine. Strength: Abduction 4/5, external rotation 5/5, internal rotation 5/5. Positive Hawkins and Neer's test. Pulses cap refill and sensation are intact distally.     Lab and Radiology Results X-ray images right shoulder obtained today personally and independently reviewed. Minimal glenohumeral DJD.  Significant AC DJD with spur formation. Await formal radiology review    Assessment and Plan: 68 y.o. female with bothersome persistent right shoulder pain failing typical conservative management.  At this point it is reasonable to proceed with further evaluation and potential surgical planning.  Plan for MRI shoulder and proceed with alternative injections or likely referral to orthopedics for surgery either rotator cuff repair or debridement or both.  We will contact patient once MRI results are back to discuss further plans.   PDMP reviewed during this encounter. Orders Placed This Encounter  Procedures  . DG Shoulder Right    Standing Status:   Future    Number of Occurrences:   1    Standing Expiration Date:   03/17/2020    Order Specific Question:   Reason for Exam (SYMPTOM  OR DIAGNOSIS REQUIRED)    Answer:   eval worsening right shoudler pain    Order Specific Question:   Preferred imaging location?    Answer:   [facility]    Order Specific Question:   Radiology Contrast Protocol - do NOT remove file path    Answer:   \\[email or web address].pdf  . MR Shoulder Right Wo Contrast    Standing Status:   Future    Standing Expiration Date:   03/17/2020    Order Specific Question:   What is the patient's sedation requirement?    Answer:   No Sedation    Order Specific  Question:   Does the patient have a pacemaker or implanted devices?    Answer:   No    Order Specific Question:   Preferred imaging location?    Answer:   [facility] (table limit-350lbs)    Order Specific Question:   Radiology Contrast Protocol - do NOT remove file path    Answer:   \\[email or web address]\Radiant\[email or web address]   Meds ordered this encounter  Medications  . HYDROcodone-acetaminophen (NORCO/VICODIN) 5-325  MG tablet    Sig: Take 1 tablet by mouth every 6 (six) hours as needed.    Dispense:  15 tablet    Refill:  0    Historical information moved to improve visibility of documentation.  Past Medical History:  Diagnosis Date  . Iron deficiency anemia 12/15/2011  . Leukocytosis 12/15/2011  . Osteopenia determined by x-ray 06/27/2017   March 2019 T score -2.3  . Pyelonephritis   . 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 VUR (vesicoureteric reflux) 11/30/2011   Past Surgical History:  Procedure Laterality Date  . ABDOMINAL HYSTERECTOMY    . APPENDECTOMY    . CHOLECYSTECTOMY    . GASTRIC BYPASS    . KIDNEY SURGERY    . KNEE SURGERY     left x 2 right x 1  . TONSILLECTOMY     Social History   Tobacco Use  . Smoking status: Never Smoker  . Smokeless tobacco: Never Used  Substance Use Topics  . Alcohol use: No   family history includes Alzheimer's disease in her mother; COPD in her mother; Cancer in her brother and father; Diabetes in her sister; Heart attack in her brother, mother, and sister; Heart failure in her sister; Hypertension in her mother.  Medications: Current Outpatient Medications  Medication Sig Dispense Refill  . diclofenac sodium (VOLTAREN) 1 % GEL Apply 4 g topically 4 (four) times daily. To affected joint. 100 g 11  . gabapentin (NEURONTIN) 300 MG capsule Take 1 capsule (300 mg total) by mouth at bedtime. For restless leg syndrome 90 capsule 3  . HYDROcodone-acetaminophen (NORCO/VICODIN) 5-325 MG tablet Take 1 tablet by mouth every 6 (six) hours as needed. 15 tablet 0  . IRON PO Take by mouth.    . ondansetron (ZOFRAN-ODT) 8 MG disintegrating tablet Take 1 tablet (8 mg total) by mouth every 8 (eight) hours as needed for nausea. 20 tablet 3  . sertraline (ZOLOFT) 50 MG tablet TAKE 1 TABLET BY MOUTH EVERY DAY 30 tablet 0  . traZODone (DESYREL) 50 MG tablet Take 0.5-1 tablets (25-50 mg total) by mouth at bedtime as needed for sleep.  30 tablet 3   No current facility-administered medications for this visit.    Allergies  Allergen Reactions  . Ciprofloxacin     seizure  . Oxybutynin     seizure  . Sulfa Antibiotics     seizures      Discussed warning signs or symptoms. Please see discharge instructions. Patient expresses understanding.

## 2019-02-04 ENCOUNTER — Other Ambulatory Visit: Payer: Self-pay

## 2019-02-04 ENCOUNTER — Ambulatory Visit (INDEPENDENT_AMBULATORY_CARE_PROVIDER_SITE_OTHER): Payer: Medicare Other

## 2019-02-04 DIAGNOSIS — M25511 Pain in right shoulder: Secondary | ICD-10-CM

## 2019-02-10 ENCOUNTER — Other Ambulatory Visit: Payer: Self-pay

## 2019-02-10 ENCOUNTER — Encounter: Payer: Self-pay | Admitting: Sports Medicine

## 2019-02-10 ENCOUNTER — Ambulatory Visit (INDEPENDENT_AMBULATORY_CARE_PROVIDER_SITE_OTHER): Payer: Medicare Other | Admitting: Sports Medicine

## 2019-02-10 DIAGNOSIS — M19011 Primary osteoarthritis, right shoulder: Secondary | ICD-10-CM

## 2019-02-10 NOTE — Progress Notes (Signed)
Subjective:    CC: Right shoulder pain  HPI: This is a pleasant 68 year old female with chronic right shoulder pain, she is had a couple subacromial injections with moderate relief, she had an MRI that showed multiple pathologic changes.  She is getting married in a week and would like aggressive intervention today, pain is severe, persistent, localized at the joint line without radiation.  I reviewed the past medical history, family history, social history, surgical history, and allergies today and no changes were needed.  Please see the problem list section below in epic for further details.  Past Medical History: Past Medical History:  Diagnosis Date  . Iron deficiency anemia 12/15/2011  . Leukocytosis 12/15/2011  . Osteopenia determined by x-ray 06/27/2017   March 2019 T score -2.3  . Pyelonephritis   . 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 VUR (vesicoureteric reflux) 11/30/2011   Past Surgical History: Past Surgical History:  Procedure Laterality Date  . ABDOMINAL HYSTERECTOMY    . APPENDECTOMY    . CHOLECYSTECTOMY    . GASTRIC BYPASS    . KIDNEY SURGERY    . KNEE SURGERY     left x 2 right x 1  . TONSILLECTOMY     Social History: Social History   Socioeconomic History  . Marital status: Widowed    Spouse name: Not on file  . Number of children: Not on file  . Years of education: Not on file  . Highest education level: Not on file  Occupational History  . Not on file  Social Needs  . Financial resource strain: Not on file  . Food insecurity    Worry: Not on file    Inability: Not on file  . Transportation needs    Medical: Not on file    Non-medical: Not on file  Tobacco Use  . Smoking status: Never Smoker  . Smokeless tobacco: Never Used  Substance and Sexual Activity  . Alcohol use: No  . Drug use: No  . Sexual activity: Never  Lifestyle  . Physical activity    Days per week: Not on file    Minutes per session: Not  on file  . Stress: Not on file  Relationships  . Social Herbalist on phone: Not on file    Gets together: Not on file    Attends religious service: Not on file    Active member of club or organization: Not on file    Attends meetings of clubs or organizations: Not on file    Relationship status: Not on file  Other Topics Concern  . Not on file  Social History Narrative  . Not on file   Family History: Family History  Problem Relation Age of Onset  . Alzheimer's disease Mother   . COPD Mother   . Hypertension Mother   . Heart attack Mother   . Cancer Father        Lung  . Heart attack Sister   . Diabetes Sister   . Heart failure Sister   . Heart attack Brother   . Cancer Brother        Lung   Allergies: Allergies  Allergen Reactions  . Ciprofloxacin     seizure  . Oxybutynin     seizure  . Sulfa Antibiotics     seizures   Medications: See med rec.  Review of Systems: No fevers, chills, night sweats, weight loss, chest pain, or shortness of  breath.   Objective:    General: Well Developed, well nourished, and in no acute distress.  Neuro: Alert and oriented x3, extra-ocular muscles intact, sensation grossly intact.  HEENT: Normocephalic, atraumatic, pupils equal round reactive to light, neck supple, no masses, no lymphadenopathy, thyroid nonpalpable.  Skin: Warm and dry, no rashes. Cardiac: Regular rate and rhythm, no murmurs rubs or gallops, no lower extremity edema.  Respiratory: Clear to auscultation bilaterally. Not using accessory muscles, speaking in full sentences. Right shoulder: Pain with abduction and external rotation, abduction to only 20 degrees.  Procedure: Real-time Ultrasound Guided injection of the right glenohumeral joint Device: GE Logiq E  Verbal informed consent obtained.  Time-out conducted.  Noted no overlying erythema, induration, or other signs of local infection.  Skin prepped in a sterile fashion.  Local anesthesia:  Topical Ethyl chloride.  With sterile technique and under real time ultrasound guidance:  1 cc Kenalog 40, 2 cc lidocaine, 2 cc bupivacaine injected easily Completed without difficulty  Pain immediately resolved suggesting accurate placement of the medication.  Advised to call if fevers/chills, erythema, induration, drainage, or persistent bleeding.  Images permanently stored and available for review in the ultrasound unit.  Impression: Technically successful ultrasound guided injection.  Impression and Recommendations:    Primary osteoarthritis, right shoulder Rotator cuff arthropathy with multiple degenerative processes including acromioclavicular osteoarthritis, rupture of the long head of the biceps, supraspinatus tear, glenohumeral osteoarthritis. She has done well with shots in the past, pain feels to be more referable to the glenohumeral joint today, this was injected, she is getting married on Saturday and has a honeymoon coming up. If she is not feeling a lot better by Friday I am happy to see her and do another subacromial injection. She does understand that surgery is looming. Return to see me in a month.   ___________________________________________ Gwen Her. Dianah Field, M.D., ABFM., CAQSM. Primary Care and Sports Medicine [facility]  Adjunct Professor of [employer] [facility] [employer]

## 2019-02-10 NOTE — Assessment & Plan Note (Signed)
Rotator cuff arthropathy with multiple degenerative processes including acromioclavicular osteoarthritis, rupture of the long head of the biceps, supraspinatus tear, glenohumeral osteoarthritis. She has done well with shots in the past, pain feels to be more referable to the glenohumeral joint today, this was injected, she is getting married on Saturday and has a honeymoon coming up. If she is not feeling a lot better by Friday I am happy to see her and do another subacromial injection. She does understand that surgery is looming. Return to see me in a month.

## 2019-03-10 ENCOUNTER — Ambulatory Visit (INDEPENDENT_AMBULATORY_CARE_PROVIDER_SITE_OTHER): Payer: Medicare Other | Admitting: Sports Medicine

## 2019-03-10 ENCOUNTER — Encounter: Payer: Self-pay | Admitting: Sports Medicine

## 2019-03-10 ENCOUNTER — Other Ambulatory Visit: Payer: Self-pay

## 2019-03-10 DIAGNOSIS — M19011 Primary osteoarthritis, right shoulder: Secondary | ICD-10-CM

## 2019-03-10 MED ORDER — HYDROCODONE-ACETAMINOPHEN 5-325 MG PO TABS: 1.0000 | ORAL_TABLET | Freq: Four times a day (QID) | ORAL | 0 refills | Status: DC | PRN

## 2019-03-10 NOTE — Assessment & Plan Note (Signed)
Multiple pathologic findings including rotator cuff arthropathy, AC osteoarthritis, rupture of the long head of the biceps, supraspinatus tear, glenohumeral osteoarthritis. She has had a few subacromial injections and more recently an ultrasound-guided glenohumeral injection. This got her through her wedding but she understands now that surgery is the next step, she still has mild to moderate pain. Referral to Dr. Griffin Basil, I am going to refill her pain medication in the meantime.

## 2019-03-10 NOTE — Progress Notes (Signed)
Subjective:    CC: Follow-up  HPI: This is a very pleasant 68 year old female, she has complex right shoulder pain, multiple pain generators, degenerative processes, we did a glenohumeral injection at the last visit to get her through her wedding and honeymoon, she is doing okay today but still gets occasional significant twinges of pain.  I reviewed the past medical history, family history, social history, surgical history, and allergies today and no changes were needed.  Please see the problem list section below in epic for further details.  Past Medical History: Past Medical History:  Diagnosis Date  . Iron deficiency anemia 12/15/2011  . Leukocytosis 12/15/2011  . Osteopenia determined by x-ray 06/27/2017   March 2019 T score -2.3  . Pyelonephritis   . 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 VUR (vesicoureteric reflux) 11/30/2011   Past Surgical History: Past Surgical History:  Procedure Laterality Date  . ABDOMINAL HYSTERECTOMY    . APPENDECTOMY    . CHOLECYSTECTOMY    . GASTRIC BYPASS    . KIDNEY SURGERY    . KNEE SURGERY     left x 2 right x 1  . TONSILLECTOMY     Social History: Social History   Socioeconomic History  . Marital status: Widowed    Spouse name: Not on file  . Number of children: Not on file  . Years of education: Not on file  . Highest education level: Not on file  Occupational History  . Not on file  Social Needs  . Financial resource strain: Not on file  . Food insecurity    Worry: Not on file    Inability: Not on file  . Transportation needs    Medical: Not on file    Non-medical: Not on file  Tobacco Use  . Smoking status: Never Smoker  . Smokeless tobacco: Never Used  Substance and Sexual Activity  . Alcohol use: No  . Drug use: No  . Sexual activity: Never  Lifestyle  . Physical activity    Days per week: Not on file    Minutes per session: Not on file  . Stress: Not on file  Relationships  .  Social Herbalist on phone: Not on file    Gets together: Not on file    Attends religious service: Not on file    Active member of club or organization: Not on file    Attends meetings of clubs or organizations: Not on file    Relationship status: Not on file  Other Topics Concern  . Not on file  Social History Narrative  . Not on file   Family History: Family History  Problem Relation Age of Onset  . Alzheimer's disease Mother   . COPD Mother   . Hypertension Mother   . Heart attack Mother   . Cancer Father        Lung  . Heart attack Sister   . Diabetes Sister   . Heart failure Sister   . Heart attack Brother   . Cancer Brother        Lung   Allergies: Allergies  Allergen Reactions  . Ciprofloxacin     seizure  . Oxybutynin     seizure  . Sulfa Antibiotics     seizures   Medications: See med rec.  Review of Systems: No fevers, chills, night sweats, weight loss, chest pain, or shortness of breath.   Objective:    General: Well  Developed, well nourished, and in no acute distress.  Neuro: Alert and oriented x3, extra-ocular muscles intact, sensation grossly intact.  HEENT: Normocephalic, atraumatic, pupils equal round reactive to light, neck supple, no masses, no lymphadenopathy, thyroid nonpalpable.  Skin: Warm and dry, no rashes. Cardiac: Regular rate and rhythm, no murmurs rubs or gallops, no lower extremity edema.  Respiratory: Clear to auscultation bilaterally. Not using accessory muscles, speaking in full sentences.  Impression and Recommendations:    Primary osteoarthritis, right shoulder Multiple pathologic findings including rotator cuff arthropathy, AC osteoarthritis, rupture of the long head of the biceps, supraspinatus tear, glenohumeral osteoarthritis. She has had a few subacromial injections and more recently an ultrasound-guided glenohumeral injection. This got her through her wedding but she understands now that surgery is the next  step, she still has mild to moderate pain. Referral to Dr. Griffin Basil, I am going to refill her pain medication in the meantime.   ___________________________________________ Gwen Her. Dianah Field, M.D., ABFM., CAQSM. Primary Care and Sports Medicine [facility]  Adjunct Professor of [employer] [facility] [employer] Medicine

## 2019-03-10 NOTE — Addendum Note (Signed)
Addended by: Silverio Decamp on: 03/10/2019 01:49 PM   Modules accepted: Orders

## 2019-03-11 ENCOUNTER — Other Ambulatory Visit: Payer: Self-pay | Admitting: Osteopathic Medicine

## 2019-03-11 DIAGNOSIS — F339 Major depressive disorder, recurrent, unspecified: Secondary | ICD-10-CM

## 2019-03-13 DIAGNOSIS — M25511 Pain in right shoulder: Secondary | ICD-10-CM | POA: Diagnosis not present

## 2019-03-14 ENCOUNTER — Other Ambulatory Visit: Payer: Self-pay | Admitting: *Deleted

## 2019-03-14 DIAGNOSIS — M19011 Primary osteoarthritis, right shoulder: Secondary | ICD-10-CM

## 2019-03-14 MED ORDER — HYDROCODONE-ACETAMINOPHEN 5-325 MG PO TABS: 1.0000 | ORAL_TABLET | Freq: Four times a day (QID) | ORAL | 0 refills | Status: DC | PRN

## 2019-03-14 NOTE — Telephone Encounter (Signed)
[facility] called today requesting that the Vicodin be sent in electronically instead of the paper rx that the pt dropped off.

## 2019-04-01 ENCOUNTER — Encounter: Payer: Self-pay | Admitting: Medical-Surgical

## 2019-04-01 ENCOUNTER — Other Ambulatory Visit: Payer: Self-pay | Admitting: Osteopathic Medicine

## 2019-04-01 ENCOUNTER — Ambulatory Visit (INDEPENDENT_AMBULATORY_CARE_PROVIDER_SITE_OTHER): Payer: Medicare Other | Admitting: Medical-Surgical

## 2019-04-01 ENCOUNTER — Other Ambulatory Visit: Payer: Self-pay

## 2019-04-01 VITALS — BP 131/61 | HR 60 | Temp 97.5°F | Wt 195.0 lb

## 2019-04-01 DIAGNOSIS — M19011 Primary osteoarthritis, right shoulder: Secondary | ICD-10-CM

## 2019-04-01 DIAGNOSIS — N390 Urinary tract infection, site not specified: Secondary | ICD-10-CM

## 2019-04-01 DIAGNOSIS — R300 Dysuria: Secondary | ICD-10-CM

## 2019-04-01 DIAGNOSIS — F339 Major depressive disorder, recurrent, unspecified: Secondary | ICD-10-CM

## 2019-04-01 LAB — POCT URINALYSIS DIP (CLINITEK)
Bilirubin, UA: NEGATIVE
Glucose, UA: NEGATIVE mg/dL
Ketones, POC UA: NEGATIVE mg/dL
Nitrite, UA: POSITIVE — AB
POC PROTEIN,UA: NEGATIVE
Spec Grav, UA: 1.02 (ref 1.010–1.025)
Urobilinogen, UA: 0.2 E.U./dL
pH, UA: 6 (ref 5.0–8.0)

## 2019-04-01 MED ORDER — AMOXICILLIN-POT CLAVULANATE 875-125 MG PO TABS: 1.0000 | ORAL_TABLET | Freq: Two times a day (BID) | ORAL | 0 refills | Status: AC

## 2019-04-01 MED ORDER — HYDROCODONE-ACETAMINOPHEN 5-325 MG PO TABS: 1.0000 | ORAL_TABLET | Freq: Four times a day (QID) | ORAL | 0 refills | Status: DC | PRN

## 2019-04-01 MED ORDER — ONDANSETRON 8 MG PO TBDP: 8.0000 mg | ORAL_TABLET | Freq: Three times a day (TID) | ORAL | 3 refills | Status: DC | PRN

## 2019-04-01 MED ORDER — CEFTRIAXONE SODIUM 1 G IJ SOLR
1.0000 g | Freq: Once | INTRAMUSCULAR | Status: AC
  Administered 2019-04-01: 1 g via INTRAMUSCULAR

## 2019-04-01 NOTE — Assessment & Plan Note (Addendum)
POCT urinalysis.Ceftriaxone 1 g IM in office followed by 10-day course of Augmentin twice daily.  Plan for virtual visit tomorrow to evaluate improvement in symptoms.  May need second dose of IM ceftriaxone if no significant improvement.  Zofran ODT for intermittent nausea/vomiting.  Push fluids.

## 2019-04-01 NOTE — Telephone Encounter (Signed)
Requested medication (s) are due for refill today: yes  Requested medication (s) are on the active medication list: yes  Last refill:  03/11/2019  Future visit scheduled: no  Notes to clinic:  review for refill   Requested Prescriptions  Pending Prescriptions Disp Refills   sertraline (ZOLOFT) 50 MG tablet [Pharmacy Med Name: SERTRALINE HCL 50 MG TAB] 15 tablet 0    Sig: TAKE 1 TABLET BY MOUTH EVERY DAY      Psychiatry:  Antidepressants - SSRI Failed - 04/01/2019  7:12 AM      Failed - Completed PHQ-2 or PHQ-9 in the last 360 days.      Failed - Valid encounter within last 6 months    Recent Outpatient Visits           3 weeks ago Primary osteoarthritis, right shoulder   [facility], Gwen Her, MD   1 month ago Primary osteoarthritis, right shoulder   [facility], Gwen Her, MD   2 months ago Acute pain of right shoulder   [facility], Rebekah [place], MD   3 months ago Acute pain of right shoulder   [facility], Rebekah Chesterfield, MD   7 months ago Acute pain of right shoulder   [facility], Rebekah Chesterfield, MD

## 2019-04-01 NOTE — Assessment & Plan Note (Signed)
Norco refill provided per Dr. Darene Lamer for chronic right shoulder pain.  Surgery scheduled for 04/24/2019.

## 2019-04-01 NOTE — Progress Notes (Signed)
Subjective:    CC: Dysuria  HPI:  Pleasant 68 year old female presenting with complaints of right-sided back pain, malodorous urine, suprapubic pain/pressure, burning, frequency, urgency x3 weeks.  Also reports daily nausea and vomiting, more at night.  Back pain noted to be worse at night as well.  Has tried taking Aleve but no relief.  Does have some incontinence but this is not a new problem.  Reports history of kidney/urinary tract infections previously treated by Dr. Estill Dooms but he has since left the practice and she was unable to get an appointment with a new urologist.  Usually treats symptoms with Keflex 4 times daily for 7 days with complete relief.  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    I reviewed the past medical history, family history, social history, surgical history, and allergies today and no changes were needed.  Please see the problem list section below in epic for further details.  Past Medical History: Past Medical History:  Diagnosis Date  . Iron deficiency anemia 12/15/2011  . Leukocytosis 12/15/2011  . Osteopenia determined by x-ray 06/27/2017   March 2019 T score -2.3  . Pyelonephritis   . 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 VUR (vesicoureteric reflux) 11/30/2011   Past Surgical History: Past Surgical History:  Procedure Laterality Date  . ABDOMINAL HYSTERECTOMY    . APPENDECTOMY    . CHOLECYSTECTOMY    . GASTRIC BYPASS    . KIDNEY SURGERY    . KNEE SURGERY     left x 2 right x 1  . TONSILLECTOMY     Social History: Social History   Socioeconomic History  . Marital status: Widowed    Spouse name: Not on file  . Number of children: Not on file  . Years of education: Not on file  . Highest education level: Not on file  Occupational History  . Not on file  Tobacco Use  . Smoking status: Never Smoker  . Smokeless tobacco: Never Used   Substance and Sexual Activity  . Alcohol use: No  . Drug use: No  . Sexual activity: Never  Other Topics Concern  . Not on file  Social History Narrative  . Not on file   Social Determinants of Health   Financial Resource Strain:   . Difficulty of Paying Living Expenses: Not on file  Food Insecurity:   . Worried About Charity fundraiser in the Last Year: Not on file  . Ran Out of Food in the Last Year: Not on file  Transportation Needs:   . Lack of Transportation (Medical): Not on file  . Lack of Transportation (Non-Medical): Not on file  Physical Activity:   . Days of Exercise per Week: Not on file  . Minutes of Exercise per Session: Not on file  Stress:   . Feeling of Stress : Not on file  Social Connections:   . Frequency of Communication with Friends and Family: Not on file  . Frequency of Social Gatherings with Friends and Family: Not on file  . Attends Religious Services: Not on file  . Active Member of Clubs or Organizations: Not on file  . Attends Archivist Meetings: Not on file  . Marital Status: Not on file   Family History: Family History  Problem Relation Age of Onset  . Alzheimer's disease Mother   . COPD Mother   .  Hypertension Mother   . Heart attack Mother   . Cancer Father        Lung  . Heart attack Sister   . Diabetes Sister   . Heart failure Sister   . Heart attack Brother   . Cancer Brother        Lung   Allergies: Allergies  Allergen Reactions  . Ciprofloxacin     seizure  . Oxybutynin     seizure  . Sulfa Antibiotics     seizures   Medications: See med rec.  Review of Systems: No fevers, chills, night sweats, weight loss, chest pain, or shortness of breath.   Objective:    General: Well Developed, well nourished, and in no acute distress.  Neuro: Alert and oriented x3.  HEENT: Normocephalic, atraumatic.  Skin: Warm and dry. Cardiac: Regular rate and rhythm, no murmurs rubs or gallops.  Respiratory: Clear to  auscultation bilaterally. Not using accessory muscles, speaking in full sentences. Abdomen: Soft, non-distended.  Tenderness and guarding to bilateral lower quadrants and suprapubic area. Musculoskeletal: Significant right CVA tenderness.   Impression and Recommendations:    Complicated UTI (urinary tract infection) POCT urinalysis.Ceftriaxone 1 g IM in office followed by 10-day course of Augmentin twice daily.  Plan for virtual visit tomorrow to evaluate improvement in symptoms.  May need second dose of IM ceftriaxone if no significant improvement.  Zofran ODT for intermittent nausea/vomiting.  Push fluids.  Primary osteoarthritis, right shoulder Norco refill provided per Dr. Darene Lamer for chronic right shoulder pain.  Surgery scheduled for 04/24/2019.  Return for virtual visit on 04/01/2019 to check for improvement in symptoms.  ___________________________________________ Clearnce Sorrel, DNP, APRN, FNP-BC Primary Care and Sports Medicine [place]

## 2019-04-02 ENCOUNTER — Encounter: Payer: Self-pay | Admitting: Medical-Surgical

## 2019-04-02 ENCOUNTER — Ambulatory Visit (INDEPENDENT_AMBULATORY_CARE_PROVIDER_SITE_OTHER): Payer: Medicare Other | Admitting: Medical-Surgical

## 2019-04-02 VITALS — BP 131/61 | HR 60 | Temp 97.5°F | Ht 61.5 in | Wt 195.0 lb

## 2019-04-02 DIAGNOSIS — N390 Urinary tract infection, site not specified: Secondary | ICD-10-CM

## 2019-04-02 NOTE — Assessment & Plan Note (Signed)
Urine culture still pending.  Continue Augmentin twice a day for the 10-day course.  No need for second dose ceftriaxone at this time.

## 2019-04-02 NOTE — Progress Notes (Signed)
Virtual Visit via Video Note  I connected with [name] on 04/02/19 at 10:50 AM EST by a telemedicine application and verified that I am speaking with the correct person using two identifiers.   I discussed the limitations of evaluation and management by telemedicine and the availability of in person appointments. The patient expressed understanding and agreed to proceed.  Subjective:    CC: Follow-up for complicated UTI  HPI:  Seen yesterday for complaints of urinary symptoms with back pain/CVA tenderness, suprapubic tenderness, and intermittent nausea/vomiting.  Urine positive for leukocytes, nitrites, and trace blood.  Rocephin 1 g IM given in office followed by Augmentin 10-day course.  Checking in with patient today for improvement in symptoms versus need for second Rocephin dose.  Patient reports noticeable improvement in symptoms starting last night.  Does still have intermittent back pain and nausea but has lessened in severity and frequency.  Was able to obtain her Augmentin prescription and took her first dose last night.   Past medical history, Surgical history, Family history not pertinant except as noted below, Social history, Allergies, and medications have been entered into the medical record, reviewed, and corrections made.   Review of Systems: No fevers, chills, night sweats, weight loss, chest pain, or shortness of breath.   Objective:    General: Speaking clearly in complete sentences without any shortness of breath.  Alert and oriented x3.  Normal judgment. No apparent acute distress.    Impression and Recommendations:    Complicated UTI (urinary tract infection) Urine culture still pending.  Continue Augmentin twice a day for the 10-day course.  No need for second dose ceftriaxone at this time.  Return if symptoms worsen or fail to improve.  10 minutes of non-face-to-face time was provided during this encounter.  I discussed the assessment and treatment  plan with the patient. The patient was provided an opportunity to ask questions and all were answered. The patient agreed with the plan and demonstrated an understanding of the instructions.   The patient was advised to call back or seek an in-person evaluation if the symptoms worsen or if the condition fails to improve as anticipated.   Clearnce Sorrel, DNP, APRN, FNP-BC

## 2019-04-03 LAB — URINE CULTURE
MICRO NUMBER:: 1237330
SPECIMEN QUALITY:: ADEQUATE

## 2019-04-18 ENCOUNTER — Encounter (HOSPITAL_BASED_OUTPATIENT_CLINIC_OR_DEPARTMENT_OTHER): Payer: Self-pay | Admitting: Orthopaedic Surgery

## 2019-04-18 ENCOUNTER — Other Ambulatory Visit: Payer: Self-pay | Admitting: Medical-Surgical

## 2019-04-18 ENCOUNTER — Other Ambulatory Visit: Payer: Self-pay

## 2019-04-18 DIAGNOSIS — N390 Urinary tract infection, site not specified: Secondary | ICD-10-CM

## 2019-04-21 ENCOUNTER — Other Ambulatory Visit (HOSPITAL_COMMUNITY)
Admission: RE | Admit: 2019-04-21 | Discharge: 2019-04-21 | Disposition: A | Discharge status: Home / Self Care | Payer: Medicare HMO | Source: Ambulatory Visit | Attending: Orthopaedic Surgery | Admitting: Orthopaedic Surgery

## 2019-04-21 DIAGNOSIS — Z01812 Encounter for preprocedural laboratory examination: Secondary | ICD-10-CM | POA: Insufficient documentation

## 2019-04-21 DIAGNOSIS — Z20822 Contact with and (suspected) exposure to covid-19: Secondary | ICD-10-CM | POA: Diagnosis not present

## 2019-04-21 NOTE — Progress Notes (Signed)

## 2019-04-22 LAB — NOVEL CORONAVIRUS, NAA (HOSP ORDER, SEND-OUT TO REF LAB; TAT 18-24 HRS): SARS-CoV-2, NAA: NOT DETECTED

## 2019-04-22 NOTE — H&P (Signed)
PREOPERATIVE H&P  Chief Complaint: RIGHT SHOULDER IMPINGEMENT, ROTATOR CUFF TEAR  HPI: Selena May is a 69 y.o. female who is scheduled for  RIGHT SHOULDER ARTHROSCOPY WITHDEBRIDMENT,  SUBACROMIAL DECOMPRESSION AND DISTAL CLAVICULECTOMY, ROTATOR CUFF REPAIR.   Patient is a 69 year-old female who has had right shoulder pain for about a year. She has been managed non-operatively with injections and physical therapy, but has not made much progress with her shoulder over the last few months. She is limited in function and has days where she cannot raise her arm overhead.  She is very independent, newly married and wants to get back to her activities of daily living as quickly as possible.  She is right hand dominant at baseline.    Her symptoms are rated as moderate to severe, and have been worsening.  This is significantly impairing activities of daily living.    Please see clinic note for further details on this patient's care.    She has elected for surgical management.   Past Medical History:  Diagnosis Date  . Anxiety   . Impingement syndrome of right shoulder   . Iron deficiency anemia 12/15/2011  . Leukocytosis 12/15/2011  . Osteopenia determined by x-ray 06/27/2017   March 2019 T score -2.3  . Pyelonephritis   . 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 Sleep apnea    lost weight and does not use CPAP now  . 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 VUR (vesicoureteric reflux) 11/30/2011   Past Surgical History:  Procedure Laterality Date  . ABDOMINAL HYSTERECTOMY    . APPENDECTOMY    . CHOLECYSTECTOMY    . GASTRIC BYPASS    . KIDNEY SURGERY    . KNEE SURGERY     left x 2 right x 1  . TONSILLECTOMY     Social History   Socioeconomic History  . Marital status: Widowed    Spouse name: Not on file  . Number of children: Not on file  . Years of education: Not on file  . Highest education level: Not on file  Occupational History  . Not on file  Tobacco Use  . Smoking  status: Never Smoker  . Smokeless tobacco: Never Used  Substance and Sexual Activity  . Alcohol use: No  . Drug use: No  . Sexual activity: Yes    Birth control/protection: Post-menopausal  Other Topics Concern  . Not on file  Social History Narrative  . Not on file   Social Determinants of Health   Financial Resource Strain:   . Difficulty of Paying Living Expenses: Not on file  Food Insecurity:   . Worried About Charity fundraiser in the Last Year: Not on file  . Ran Out of Food in the Last Year: Not on file  Transportation Needs:   . Lack of Transportation (Medical): Not on file  . Lack of Transportation (Non-Medical): Not on file  Physical Activity:   . Days of Exercise per Week: Not on file  . Minutes of Exercise per Session: Not on file  Stress:   . Feeling of Stress : Not on file  Social Connections:   . Frequency of Communication with Friends and Family: Not on file  . Frequency of Social Gatherings with Friends and Family: Not on file  . Attends Religious Services: Not on file  . Active Member of Clubs or Organizations: Not on file  . Attends Archivist Meetings: Not on file  . Marital Status:  Not on file   Family History  Problem Relation Age of Onset  . Alzheimer's disease Mother   . COPD Mother   . Hypertension Mother   . Heart attack Mother   . Cancer Father        Lung  . Heart attack Sister   . Diabetes Sister   . Heart failure Sister   . Heart attack Brother   . Cancer Brother        Lung   Allergies  Allergen Reactions  . Ciprofloxacin     seizure  . Oxybutynin     seizure  . Sulfa Antibiotics     seizures   Prior to Admission medications   Medication Sig Start Date End Date Taking? Authorizing Provider  diclofenac sodium (VOLTAREN) 1 % GEL Apply 4 g topically 4 (four) times daily. To affected joint. 05/09/17  Yes Gregor Hams, MD  gabapentin (NEURONTIN) 300 MG capsule Take 1 capsule (300 mg total) by mouth at bedtime. For  restless leg syndrome 05/20/18  Yes Gregor Hams, MD  HYDROcodone-acetaminophen (NORCO/VICODIN) 5-325 MG tablet Take 1 tablet by mouth every 6 (six) hours as needed. 04/01/19  Yes Silverio Decamp, MD  IRON PO Take by mouth.   Yes [provider]  ondansetron (ZOFRAN-ODT) 8 MG disintegrating tablet Take 1 tablet (8 mg total) by mouth every 8 (eight) hours as needed for nausea. 04/01/19  Yes Samuel Bouche, NP  sertraline (ZOLOFT) 50 MG tablet Take 1 tablet (50 mg total) by mouth daily. **PATIENT NEEDS OFFICE VISIT FOR ADDITIONAL REFILLS** 04/01/19  Yes Emeterio Reeve, DO  traZODone (DESYREL) 50 MG tablet Take 0.5-1 tablets (25-50 mg total) by mouth at bedtime as needed for sleep. 08/13/18  Yes Gregor Hams, MD    ROS: All other systems have been reviewed and were otherwise negative with the exception of those mentioned in the HPI and as above.  Physical Exam: General: Alert, no acute distress Cardiovascular: No pedal edema Respiratory: No cyanosis, no use of accessory musculature GI: No organomegaly, abdomen is soft and non-tender Skin: No lesions in the area of chief complaint Neurologic: Sensation intact distally Psychiatric: Patient is competent for consent with normal mood and affect Lymphatic: No axillary or cervical lymphadenopathy  MUSCULOSKELETAL:  Right shoulder: active forward elevation to about 90, passive to 160. External rotation to 30.  Cuff strength is 4/5, supraspinatus and infraspinatus.  Relatively preserved subscapularis.  Positive AC tenderness to palpation, impingement and negative O'Brien's.    Imaging: X-rays of the shoulder demonstrate mild early glenohumeral joint degeneration is noted.  MRI demonstrates relatively preserved joint.  Large supraspinatus and infraspinatus tear with a retracted proximal biceps injury.    Assessment: RIGHT SHOULDER IMPINGEMENT, ROTATOR CUFF TEAR  Plan: Plan for Procedure(s): RIGHT SHOULDER ARTHROSCOPY  WITHDEBRIDMENT,  SUBACROMIAL DECOMPRESSION AND DISTAL CLAVICULECTOMY RIGHT SHOULDER ARTHROSCOPY WITH ROTATOR CUFF REPAIR  The risks benefits and alternatives were discussed with the patient including but not limited to the risks of nonoperative treatment, versus surgical intervention including infection, bleeding, nerve injury,  blood clots, cardiopulmonary complications, morbidity, mortality, among others, and they were willing to proceed.   The patient acknowledged the explanation, agreed to proceed with the plan and consent was signed.   Operative Plan: RSS, SAD, DCE, RCR, possible biceps tenodesis Discharge Medications: Standard  DVT Prophylaxis: None  Physical Therapy: Outpatient PT Special Discharge needs: [place], PA-C  04/22/2019 1:04 PM

## 2019-04-23 NOTE — Anesthesia Preprocedure Evaluation (Addendum)
Anesthesia Evaluation  Patient identified by MRN, date of birth, ID band Patient awake    Reviewed: Allergy & Precautions, NPO status , Patient's Chart, lab work & pertinent test results  Airway Mallampati: I       Dental no notable dental hx. (+) Teeth Intact   Pulmonary    Pulmonary exam normal breath sounds clear to auscultation       Cardiovascular Normal cardiovascular exam+ Valvular Problems/Murmurs  Rhythm:Regular Rate:Normal     Neuro/Psych PSYCHIATRIC DISORDERS Anxiety Depression negative neurological ROS     GI/Hepatic negative GI ROS, Neg liver ROS,   Endo/Other  negative endocrine ROS  Renal/GU negative Renal ROS  negative genitourinary   Musculoskeletal  (+) Arthritis , Osteoarthritis,    Abdominal (+) + obese,   Peds  Hematology   Anesthesia Other Findings   Reproductive/Obstetrics                            Anesthesia Physical Anesthesia Plan  ASA: II  Anesthesia Plan: General   Post-op Pain Management:  Regional for Post-op pain   Induction: Intravenous  PONV Risk Score and Plan: 3 and Ondansetron and Dexamethasone  Airway Management Planned: Oral ETT  Additional Equipment: None  Intra-op Plan:   Post-operative Plan: Extubation in OR  Informed Consent: I have reviewed the patients History and Physical, chart, labs and discussed the procedure including the risks, benefits and alternatives for the proposed anesthesia with the patient or authorized representative who has indicated his/her understanding and acceptance.     Dental advisory given  Plan Discussed with: CRNA  Anesthesia Plan Comments:        Anesthesia Quick Evaluation

## 2019-04-24 ENCOUNTER — Ambulatory Visit (HOSPITAL_BASED_OUTPATIENT_CLINIC_OR_DEPARTMENT_OTHER)
Admission: RE | Admit: 2019-04-24 | Discharge: 2019-04-24 | Disposition: A | Discharge status: Home / Self Care | Payer: Medicare HMO | Attending: Orthopaedic Surgery | Admitting: Orthopaedic Surgery

## 2019-04-24 ENCOUNTER — Ambulatory Visit (HOSPITAL_BASED_OUTPATIENT_CLINIC_OR_DEPARTMENT_OTHER): Payer: Medicare HMO | Admitting: Anesthesiology

## 2019-04-24 ENCOUNTER — Encounter (HOSPITAL_BASED_OUTPATIENT_CLINIC_OR_DEPARTMENT_OTHER): Payer: Self-pay | Admitting: Orthopaedic Surgery

## 2019-04-24 ENCOUNTER — Encounter (HOSPITAL_BASED_OUTPATIENT_CLINIC_OR_DEPARTMENT_OTHER)
Admission: RE | Disposition: A | Discharge status: Home / Self Care | Payer: Self-pay | Source: Home / Self Care | Attending: Orthopaedic Surgery

## 2019-04-24 ENCOUNTER — Other Ambulatory Visit: Payer: Self-pay

## 2019-04-24 DIAGNOSIS — Z820 Family history of epilepsy and other diseases of the nervous system: Secondary | ICD-10-CM | POA: Diagnosis not present

## 2019-04-24 DIAGNOSIS — M75121 Complete rotator cuff tear or rupture of right shoulder, not specified as traumatic: Secondary | ICD-10-CM | POA: Diagnosis not present

## 2019-04-24 DIAGNOSIS — Z6834 Body mass index (BMI) 34.0-34.9, adult: Secondary | ICD-10-CM | POA: Diagnosis not present

## 2019-04-24 DIAGNOSIS — Z801 Family history of malignant neoplasm of trachea, bronchus and lung: Secondary | ICD-10-CM | POA: Insufficient documentation

## 2019-04-24 DIAGNOSIS — M75101 Unspecified rotator cuff tear or rupture of right shoulder, not specified as traumatic: Secondary | ICD-10-CM | POA: Diagnosis present

## 2019-04-24 DIAGNOSIS — M19011 Primary osteoarthritis, right shoulder: Secondary | ICD-10-CM | POA: Insufficient documentation

## 2019-04-24 DIAGNOSIS — Z833 Family history of diabetes mellitus: Secondary | ICD-10-CM | POA: Insufficient documentation

## 2019-04-24 DIAGNOSIS — M8580 Other specified disorders of bone density and structure, unspecified site: Secondary | ICD-10-CM | POA: Diagnosis not present

## 2019-04-24 DIAGNOSIS — E669 Obesity, unspecified: Secondary | ICD-10-CM | POA: Insufficient documentation

## 2019-04-24 DIAGNOSIS — Z9884 Bariatric surgery status: Secondary | ICD-10-CM | POA: Insufficient documentation

## 2019-04-24 DIAGNOSIS — Z79899 Other long term (current) drug therapy: Secondary | ICD-10-CM | POA: Diagnosis not present

## 2019-04-24 DIAGNOSIS — Z90710 Acquired absence of both cervix and uterus: Secondary | ICD-10-CM | POA: Insufficient documentation

## 2019-04-24 DIAGNOSIS — Z8249 Family history of ischemic heart disease and other diseases of the circulatory system: Secondary | ICD-10-CM | POA: Insufficient documentation

## 2019-04-24 DIAGNOSIS — Z882 Allergy status to sulfonamides status: Secondary | ICD-10-CM | POA: Diagnosis not present

## 2019-04-24 DIAGNOSIS — Z881 Allergy status to other antibiotic agents status: Secondary | ICD-10-CM | POA: Insufficient documentation

## 2019-04-24 DIAGNOSIS — Z9049 Acquired absence of other specified parts of digestive tract: Secondary | ICD-10-CM | POA: Diagnosis not present

## 2019-04-24 DIAGNOSIS — F329 Major depressive disorder, single episode, unspecified: Secondary | ICD-10-CM | POA: Diagnosis not present

## 2019-04-24 DIAGNOSIS — F419 Anxiety disorder, unspecified: Secondary | ICD-10-CM | POA: Diagnosis not present

## 2019-04-24 DIAGNOSIS — Z825 Family history of asthma and other chronic lower respiratory diseases: Secondary | ICD-10-CM | POA: Diagnosis not present

## 2019-04-24 DIAGNOSIS — M25811 Other specified joint disorders, right shoulder: Secondary | ICD-10-CM | POA: Diagnosis not present

## 2019-04-24 DIAGNOSIS — Z791 Long term (current) use of non-steroidal anti-inflammatories (NSAID): Secondary | ICD-10-CM | POA: Diagnosis not present

## 2019-04-24 HISTORY — DX: Anxiety disorder, unspecified: F41.9

## 2019-04-24 HISTORY — DX: Sleep apnea, unspecified: G47.30

## 2019-04-24 HISTORY — PX: SHOULDER ARTHROSCOPY WITH ROTATOR CUFF REPAIR: SHX5685

## 2019-04-24 HISTORY — DX: Impingement syndrome of right shoulder: M75.41

## 2019-04-24 SURGERY — SHOULDER ARTHROSCOPY WITH SUBACROMIAL DECOMPRESSION AND DISTAL CLAVICLE EXCISION
Anesthesia: General | Site: Shoulder | Laterality: Right

## 2019-04-24 MED ORDER — PROPOFOL 10 MG/ML IV BOLUS
INTRAVENOUS | Status: DC | PRN
  Administered 2019-04-24: 150 mg via INTRAVENOUS

## 2019-04-24 MED ORDER — ROCURONIUM BROMIDE 10 MG/ML (PF) SYRINGE
PREFILLED_SYRINGE | INTRAVENOUS | Status: AC
  Filled 2019-04-24: qty 10

## 2019-04-24 MED ORDER — ONDANSETRON HCL 4 MG/2ML IJ SOLN: 4.0000 mg | Freq: Once | INTRAMUSCULAR | Status: DC | PRN

## 2019-04-24 MED ORDER — BUPIVACAINE-EPINEPHRINE (PF) 0.5% -1:200000 IJ SOLN
INTRAMUSCULAR | Status: DC | PRN
  Administered 2019-04-24 (×5): 3 mL via PERINEURAL

## 2019-04-24 MED ORDER — MELOXICAM 7.5 MG PO TABS: 7.5000 mg | ORAL_TABLET | Freq: Every day | ORAL | 2 refills | Status: AC

## 2019-04-24 MED ORDER — KETOROLAC TROMETHAMINE 15 MG/ML IJ SOLN
15.0000 mg | Freq: Once | INTRAMUSCULAR | Status: AC
  Administered 2019-04-24: 10:00:00 15 mg via INTRAVENOUS

## 2019-04-24 MED ORDER — LACTATED RINGERS IV SOLN: INTRAVENOUS | Status: DC

## 2019-04-24 MED ORDER — DEXAMETHASONE SODIUM PHOSPHATE 10 MG/ML IJ SOLN
INTRAMUSCULAR | Status: AC
  Filled 2019-04-24: qty 1

## 2019-04-24 MED ORDER — MEPERIDINE HCL 25 MG/ML IJ SOLN: 6.2500 mg | INTRAMUSCULAR | Status: DC | PRN

## 2019-04-24 MED ORDER — FENTANYL CITRATE (PF) 100 MCG/2ML IJ SOLN
50.0000 ug | INTRAMUSCULAR | Status: DC | PRN
  Administered 2019-04-24: 100 ug via INTRAVENOUS

## 2019-04-24 MED ORDER — OXYCODONE HCL 5 MG PO TABS: ORAL_TABLET | ORAL | 0 refills | Status: AC

## 2019-04-24 MED ORDER — ONDANSETRON HCL 4 MG/2ML IJ SOLN
INTRAMUSCULAR | Status: AC
  Filled 2019-04-24: qty 2

## 2019-04-24 MED ORDER — OXYCODONE HCL 5 MG PO TABS: 5.0000 mg | ORAL_TABLET | Freq: Once | ORAL | Status: DC | PRN

## 2019-04-24 MED ORDER — FENTANYL CITRATE (PF) 100 MCG/2ML IJ SOLN
INTRAMUSCULAR | Status: AC
  Filled 2019-04-24: qty 2

## 2019-04-24 MED ORDER — SUGAMMADEX SODIUM 200 MG/2ML IV SOLN
INTRAVENOUS | Status: DC | PRN
  Administered 2019-04-24: 200 mg via INTRAVENOUS

## 2019-04-24 MED ORDER — ONDANSETRON HCL 4 MG/2ML IJ SOLN
INTRAMUSCULAR | Status: DC | PRN
  Administered 2019-04-24: 4 mg via INTRAVENOUS

## 2019-04-24 MED ORDER — PHENYLEPHRINE 40 MCG/ML (10ML) SYRINGE FOR IV PUSH (FOR BLOOD PRESSURE SUPPORT)
PREFILLED_SYRINGE | INTRAVENOUS | Status: AC
  Filled 2019-04-24: qty 10

## 2019-04-24 MED ORDER — LIDOCAINE 2% (20 MG/ML) 5 ML SYRINGE
INTRAMUSCULAR | Status: AC
  Filled 2019-04-24: qty 5

## 2019-04-24 MED ORDER — EPHEDRINE SULFATE-NACL 50-0.9 MG/10ML-% IV SOSY
PREFILLED_SYRINGE | INTRAVENOUS | Status: DC | PRN
  Administered 2019-04-24 (×2): 10 mg via INTRAVENOUS
  Administered 2019-04-24: 20 mg via INTRAVENOUS

## 2019-04-24 MED ORDER — CHLORHEXIDINE GLUCONATE 4 % EX LIQD: 60.0000 mL | Freq: Once | CUTANEOUS | Status: DC

## 2019-04-24 MED ORDER — CEFAZOLIN SODIUM-DEXTROSE 2-4 GM/100ML-% IV SOLN
INTRAVENOUS | Status: AC
  Filled 2019-04-24: qty 100

## 2019-04-24 MED ORDER — ONDANSETRON HCL 4 MG PO TABS: 4.0000 mg | ORAL_TABLET | Freq: Three times a day (TID) | ORAL | 1 refills | Status: AC | PRN

## 2019-04-24 MED ORDER — ACETAMINOPHEN 500 MG PO TABS: 1000.0000 mg | ORAL_TABLET | Freq: Three times a day (TID) | ORAL | 0 refills | Status: AC

## 2019-04-24 MED ORDER — DEXAMETHASONE SODIUM PHOSPHATE 10 MG/ML IJ SOLN
INTRAMUSCULAR | Status: DC | PRN
  Administered 2019-04-24: 10 mg via INTRAVENOUS

## 2019-04-24 MED ORDER — LIDOCAINE 2% (20 MG/ML) 5 ML SYRINGE
INTRAMUSCULAR | Status: DC | PRN
  Administered 2019-04-24: 100 mg via INTRAVENOUS

## 2019-04-24 MED ORDER — BUPIVACAINE LIPOSOME 1.3 % IJ SUSP
INTRAMUSCULAR | Status: DC | PRN
  Administered 2019-04-24 (×5): 2 mL via PERINEURAL

## 2019-04-24 MED ORDER — ACETAMINOPHEN 160 MG/5ML PO SOLN: 325.0000 mg | ORAL | Status: DC | PRN

## 2019-04-24 MED ORDER — ACETAMINOPHEN 325 MG PO TABS: 325.0000 mg | ORAL_TABLET | ORAL | Status: DC | PRN

## 2019-04-24 MED ORDER — FENTANYL CITRATE (PF) 100 MCG/2ML IJ SOLN
25.0000 ug | INTRAMUSCULAR | Status: DC | PRN
  Administered 2019-04-24 (×3): 50 ug via INTRAVENOUS

## 2019-04-24 MED ORDER — MIDAZOLAM HCL 2 MG/2ML IJ SOLN
1.0000 mg | INTRAMUSCULAR | Status: DC | PRN
  Administered 2019-04-24: 07:00:00 2 mg via INTRAVENOUS

## 2019-04-24 MED ORDER — ROCURONIUM BROMIDE 50 MG/5ML IV SOSY
PREFILLED_SYRINGE | INTRAVENOUS | Status: DC | PRN
  Administered 2019-04-24: 60 mg via INTRAVENOUS

## 2019-04-24 MED ORDER — MIDAZOLAM HCL 2 MG/2ML IJ SOLN
INTRAMUSCULAR | Status: AC
  Filled 2019-04-24: qty 2

## 2019-04-24 MED ORDER — PHENYLEPHRINE 40 MCG/ML (10ML) SYRINGE FOR IV PUSH (FOR BLOOD PRESSURE SUPPORT)
PREFILLED_SYRINGE | INTRAVENOUS | Status: DC | PRN
  Administered 2019-04-24 (×6): 80 ug via INTRAVENOUS

## 2019-04-24 MED ORDER — SODIUM CHLORIDE 0.9 % IR SOLN
Status: DC | PRN
  Administered 2019-04-24: 12000 mL

## 2019-04-24 MED ORDER — KETOROLAC TROMETHAMINE 30 MG/ML IJ SOLN
INTRAMUSCULAR | Status: AC
  Filled 2019-04-24: qty 1

## 2019-04-24 MED ORDER — EPHEDRINE 5 MG/ML INJ
INTRAVENOUS | Status: AC
  Filled 2019-04-24: qty 10

## 2019-04-24 MED ORDER — SODIUM CHLORIDE 0.9 % IR SOLN
Status: DC | PRN
  Administered 2019-04-24 (×2): 3000 mL

## 2019-04-24 MED ORDER — EPINEPHRINE PF 1 MG/ML IJ SOLN
INTRAMUSCULAR | Status: AC
  Filled 2019-04-24: qty 4

## 2019-04-24 MED ORDER — OXYCODONE HCL 5 MG/5ML PO SOLN: 5.0000 mg | Freq: Once | ORAL | Status: DC | PRN

## 2019-04-24 MED ORDER — PROPOFOL 500 MG/50ML IV EMUL
INTRAVENOUS | Status: AC
  Filled 2019-04-24: qty 50

## 2019-04-24 MED ORDER — CEFAZOLIN SODIUM-DEXTROSE 2-4 GM/100ML-% IV SOLN
2.0000 g | INTRAVENOUS | Status: AC
  Administered 2019-04-24: 2 g via INTRAVENOUS

## 2019-04-24 SURGICAL SUPPLY — 76 items
AID PSTN UNV HD RSTRNT DISP (MISCELLANEOUS) ×1
ANCH SUT SWLK 19.1X4.75 (Anchor) ×2 IMPLANT
ANCHOR SUT BIO SW 4.75X19.1 (Anchor) ×4 IMPLANT
APL PRP STRL LF DISP 70% ISPRP (MISCELLANEOUS) ×1
BLADE EXCALIBUR 4.0MM X 13CM (MISCELLANEOUS) ×1
BLADE EXCALIBUR 4.0X13 (MISCELLANEOUS) ×2 IMPLANT
BURR OVAL 8 FLU 4.0MM X 13CM (MISCELLANEOUS)
BURR OVAL 8 FLU 4.0X13 (MISCELLANEOUS) IMPLANT
CANNULA 5.75X71 LONG (CANNULA) IMPLANT
CANNULA PASSPORT 5 (CANNULA) ×1 IMPLANT
CANNULA PASSPORT 5CM (CANNULA) ×1
CANNULA PASSPORT BUTTON 10-40 (CANNULA) IMPLANT
CANNULA TWIST IN 8.25X7CM (CANNULA) IMPLANT
CHLORAPREP W/TINT 26 (MISCELLANEOUS) ×3 IMPLANT
CLOSURE STERI-STRIP 1/2X4 (GAUZE/BANDAGES/DRESSINGS) ×1
CLSR STERI-STRIP ANTIMIC 1/2X4 (GAUZE/BANDAGES/DRESSINGS) ×1 IMPLANT
COVER WAND RF STERILE (DRAPES) IMPLANT
DECANTER SPIKE VIAL GLASS SM (MISCELLANEOUS) IMPLANT
DISSECTOR 3.5MM X 13CM CVD (MISCELLANEOUS) IMPLANT
DISSECTOR 4.0MMX13CM CVD (MISCELLANEOUS) IMPLANT
DRAPE HALF SHEET 70X43 (DRAPES) ×3 IMPLANT
DRAPE IMP U-DRAPE 54X76 (DRAPES) ×3 IMPLANT
DRAPE INCISE IOBAN 66X45 STRL (DRAPES) IMPLANT
DRAPE SHOULDER BEACH CHAIR (DRAPES) ×3 IMPLANT
DRSG PAD ABDOMINAL 8X10 ST (GAUZE/BANDAGES/DRESSINGS) ×3 IMPLANT
DW OUTFLOW CASSETTE/TUBE SET (MISCELLANEOUS) ×3 IMPLANT
GAUZE SPONGE 4X4 12PLY STRL (GAUZE/BANDAGES/DRESSINGS) ×3 IMPLANT
GAUZE XEROFORM 1X8 LF (GAUZE/BANDAGES/DRESSINGS) ×2 IMPLANT
GLOVE BIO SURGEON STRL SZ 6.5 (GLOVE) ×3 IMPLANT
GLOVE BIO SURGEON STRL SZ7 (GLOVE) ×4 IMPLANT
GLOVE BIO SURGEONS STRL SZ 6.5 (GLOVE) ×2
GLOVE BIOGEL PI IND STRL 6.5 (GLOVE) ×1 IMPLANT
GLOVE BIOGEL PI IND STRL 7.0 (GLOVE) IMPLANT
GLOVE BIOGEL PI IND STRL 7.5 (GLOVE) IMPLANT
GLOVE BIOGEL PI IND STRL 8 (GLOVE) ×1 IMPLANT
GLOVE BIOGEL PI INDICATOR 6.5 (GLOVE) ×6
GLOVE BIOGEL PI INDICATOR 7.0 (GLOVE) ×2
GLOVE BIOGEL PI INDICATOR 7.5 (GLOVE) ×2
GLOVE BIOGEL PI INDICATOR 8 (GLOVE) ×2
GLOVE ECLIPSE 8.0 STRL XLNG CF (GLOVE) ×3 IMPLANT
GOWN STRL REUS W/ TWL LRG LVL3 (GOWN DISPOSABLE) ×1 IMPLANT
GOWN STRL REUS W/ TWL XL LVL3 (GOWN DISPOSABLE) IMPLANT
GOWN STRL REUS W/TWL LRG LVL3 (GOWN DISPOSABLE) ×12
GOWN STRL REUS W/TWL XL LVL3 (GOWN DISPOSABLE) ×6 IMPLANT
IMPL SPEEDBRIDGE KIT (Orthopedic Implant) IMPLANT
IMPLANT SPEEDBRIDGE KIT (Orthopedic Implant) ×3 IMPLANT
KIT STABILIZATION SHOULDER (MISCELLANEOUS) ×3 IMPLANT
KIT STR SPEAR 1.8 FBRTK DISP (KITS) IMPLANT
LASSO 90 CVE QUICKPAS (DISPOSABLE) IMPLANT
LASSO CRESCENT QUICKPASS (SUTURE) ×2 IMPLANT
MANIFOLD NEPTUNE II (INSTRUMENTS) ×3 IMPLANT
NDL SAFETY ECLIPSE 18X1.5 (NEEDLE) ×1 IMPLANT
NDL SCORPION MULTI FIRE (NEEDLE) IMPLANT
NEEDLE HYPO 18GX1.5 SHARP (NEEDLE) ×3
NEEDLE SCORPION MULTI FIRE (NEEDLE) IMPLANT
PACK ARTHROSCOPY DSU (CUSTOM PROCEDURE TRAY) ×3 IMPLANT
PACK BASIN DAY SURGERY FS (CUSTOM PROCEDURE TRAY) ×3 IMPLANT
PAD ORTHO SHOULDER 7X19 LRG (SOFTGOODS) ×3 IMPLANT
PORT APPOLLO RF 90DEGREE MULTI (SURGICAL WAND) ×3 IMPLANT
RESTRAINT HEAD UNIVERSAL NS (MISCELLANEOUS) ×3 IMPLANT
SLEEVE SCD COMPRESS KNEE MED (MISCELLANEOUS) ×3 IMPLANT
SLING ARM FOAM STRAP LRG (SOFTGOODS) IMPLANT
SLING ULTRA II SMALL (SOFTGOODS) ×2 IMPLANT
SPONGE LAP 4X18 RFD (DISPOSABLE) IMPLANT
SUT FIBERWIRE #2 38 T-5 BLUE (SUTURE)
SUT MNCRL AB 4-0 PS2 18 (SUTURE) ×3 IMPLANT
SUT PDS AB 1 CT  36 (SUTURE)
SUT PDS AB 1 CT 36 (SUTURE) IMPLANT
SUT TIGER TAPE 7 IN WHITE (SUTURE) IMPLANT
SUTURE FIBERWR #2 38 T-5 BLUE (SUTURE) IMPLANT
SUTURE TAPE TIGERLINK 1.3MM BL (SUTURE) IMPLANT
SUTURETAPE TIGERLINK 1.3MM BL (SUTURE) ×3
SYR 5ML LL (SYRINGE) ×3 IMPLANT
TAPE FIBER 2MM 7IN #2 BLUE (SUTURE) ×2 IMPLANT
TOWEL GREEN STERILE FF (TOWEL DISPOSABLE) ×3 IMPLANT
TUBING ARTHROSCOPY IRRIG 16FT (MISCELLANEOUS) ×3 IMPLANT

## 2019-04-24 NOTE — Anesthesia Procedure Notes (Signed)
Anesthesia Regional Block: Interscalene brachial plexus block   Pre-Anesthetic Checklist: ,, timeout performed, Correct Patient, Correct Site, Correct Laterality, Correct Procedure, Correct Position, site marked, Risks and benefits discussed,  Surgical consent,  Pre-op evaluation,  At surgeon's request and post-op pain management  Laterality: Right and Upper  Prep: chloraprep       Needles:  Injection technique: Single-shot  Needle Type: Echogenic Stimulator Needle     Needle Length: 10cm  Needle Gauge: 21   Needle insertion depth: 1 cm   Additional Needles:   Procedures:,,,, ultrasound used (permanent image in chart),,,,  Narrative:  Start time: 04/24/2019 7:12 AM End time: 04/24/2019 7:20 AM Injection made incrementally with aspirations every 5 mL.  Performed by: Personally  Anesthesiologist: Lyn Hollingshead, MD

## 2019-04-24 NOTE — Anesthesia Postprocedure Evaluation (Signed)
Anesthesia Post Note  Patient: Selena May  Procedure(s) Performed: RIGHT SHOULDER ARTHROSCOPY WITHDEBRIDMENT,  SUBACROMIAL DECOMPRESSION AND DISTAL CLAVICULECTOMY (Right Shoulder) RIGHT SHOULDER ARTHROSCOPY WITH ROTATOR CUFF REPAIR (Right Shoulder)     Patient location during evaluation: PACU Anesthesia Type: General Level of consciousness: awake and sedated Pain management: pain level controlled Vital Signs Assessment: post-procedure vital signs reviewed and stable Respiratory status: spontaneous breathing Cardiovascular status: stable Postop Assessment: no apparent nausea or vomiting Anesthetic complications: no    Last Vitals:  Vitals:   04/24/19 1015 04/24/19 1022  BP: (!) 126/58   Pulse: 81 83  Resp: 16 (!) 21  Temp:    SpO2: 97% 99%    Last Pain:  Vitals:   04/24/19 1015  TempSrc:   PainSc: 4    Pain Goal: Patients Stated Pain Goal: 3 (04/24/19 1000)                 Huston Foley

## 2019-04-24 NOTE — Progress Notes (Signed)
Assisted Dr. Hatchett with right, ultrasound guided, interscalene  block. Side rails up, monitors on throughout procedure. See vital signs in flow sheet. Tolerated Procedure well.  

## 2019-04-24 NOTE — Discharge Instructions (Signed)
Next Dose of Ibuprofen at 4 PM.   Post Anesthesia Home Care Instructions  Activity: Get plenty of rest for the remainder of the day. A responsible individual must stay with you for 24 hours following the procedure.  For the next 24 hours, DO NOT: -Drive a car -Paediatric nurse -Drink alcoholic beverages -Take any medication unless instructed by your physician -Make any legal decisions or sign important papers.  Meals: Start with liquid foods such as gelatin or soup. Progress to regular foods as tolerated. Avoid greasy, spicy, heavy foods. If nausea and/or vomiting occur, drink only clear liquids until the nausea and/or vomiting subsides. Call your physician if vomiting continues.  Special Instructions/Symptoms: Your throat may feel dry or sore from the anesthesia or the breathing tube placed in your throat during surgery. If this causes discomfort, gargle with warm salt water. The discomfort should disappear within 24 hours.  If you had a scopolamine patch placed behind your ear for the management of post- operative nausea and/or vomiting:  1. The medication in the patch is effective for 72 hours, after which it should be removed.  Wrap patch in a tissue and discard in the trash. Wash hands thoroughly with soap and water. 2. You may remove the patch earlier than 72 hours if you experience unpleasant side effects which may include dry mouth, dizziness or visual disturbances. 3. Avoid touching the patch. Wash your hands with soap and water after contact with the patch.    Regional Anesthesia Blocks  1. Numbness or the inability to move the "blocked" extremity may last from 3-48 hours after placement. The length of time depends on the medication injected and your individual response to the medication. If the numbness is not going away after 48 hours, call your surgeon.  2. The extremity that is blocked will need to be protected until the numbness is gone and the  Strength has returned.  Because you cannot feel it, you will need to take extra care to avoid injury. Because it may be weak, you may have difficulty moving it or using it. You may not know what position it is in without looking at it while the block is in effect.  3. For blocks in the legs and feet, returning to weight bearing and walking needs to be done carefully. You will need to wait until the numbness is entirely gone and the strength has returned. You should be able to move your leg and foot normally before you try and bear weight or walk. You will need someone to be with you when you first try to ensure you do not fall and possibly risk injury.  4. Bruising and tenderness at the needle site are common side effects and will resolve in a few days.  5. Persistent numbness or new problems with movement should be communicated to the surgeon or the [facility] [facility] [phone] [place] [phone]).Information for Discharge Teaching: EXPAREL (bupivacaine liposome injectable suspension)   Your surgeon or anesthesiologist gave you EXPAREL(bupivacaine) to help control your pain after surgery.   EXPAREL is a local anesthetic that provides pain relief by numbing the tissue around the surgical site.  EXPAREL is designed to release pain medication over time and can control pain for up to 72 hours.  Depending on how you respond to EXPAREL, you may require less pain medication during your recovery.  Possible side effects:  Temporary loss of sensation or ability to move in the area where bupivacaine was injected.  Nausea, vomiting, constipation  Rarely, numbness and tingling in your mouth or lips, lightheadedness, or anxiety may occur.  Call your doctor right away if you think you may be experiencing any of these sensations, or if you have other questions regarding possible side effects.  Follow all other discharge instructions given to you by your surgeon or nurse. Eat a healthy diet and  drink plenty of water or other fluids.  If you return to the hospital for any reason within 96 hours following the administration of EXPAREL, it is important for health care providers to know that you have received this anesthetic. A teal colored band has been placed on your arm with the date, time and amount of EXPAREL you have received in order to alert and inform your health care providers. Please leave this armband in place for the full 96 hours following administration, and then you may remove the band.

## 2019-04-24 NOTE — Interval H&P Note (Signed)
History and Physical Interval Note:  04/24/2019 7:23 AM  Selena May  has presented today for surgery, with the diagnosis of RIGHT SHOULDER IMPINGEMENT , PRIMARY OSTEOARTHRITS ROTATOR CUFF STRAIN OF MUSCLES AND DENDONS.  The various methods of treatment have been discussed with the patient and family. After consideration of risks, benefits and other options for treatment, the patient has consented to  Procedure(s): RIGHT SHOULDER ARTHROSCOPY WITHDEBRIDMENT,  SUBACROMIAL DECOMPRESSION AND DISTAL CLAVICULECTOMY (Right) RIGHT SHOULDER ARTHROSCOPY WITH ROTATOR CUFF REPAIR (Right) as a surgical intervention.  The patient's history has been reviewed, patient examined, no change in status, stable for surgery.  I have reviewed the patient's chart and labs.  Questions were answered to the patient's satisfaction.     Hiram Gash

## 2019-04-24 NOTE — Op Note (Signed)
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 Chondral surfaces: Scattered grade 1 changes throughout the humeral head and glenoid though no full-thickness areas Biceps: Traumatically ruptured prior to surgery Subscapularis: Upper border 30% of tendon was off with retraction of the underlying articular sided fibers which we felt should be repaired. Superior Cuff: Full-thickness tear of the supra and infraspinatus with retraction and poor tissue quality Bursal side: As above  Successful completion of the planned procedure.  Patient's bone and tissue quality were exceptionally poor.  She is at high risk for rerupture secondary to this.  She failed to be a candidate for reverse total shoulder arthroplasty rather than superior capsular reconstruction.   Post-operative plan: The patient will be non-weightbearing in a sling for 6 weeks with therapy to start 6 weeks postop.  The patient will be discharged home.  DVT prophylaxis not indicated in ambulatory upper extremity patient without known risk factors.   Pain control with PRN pain medication preferring oral medicines.  Follow up plan will be scheduled in approximately 7 days for incision check and XR.  Post-Op Diagnosis: Same Surgeons:Primary: Hiram Gash, MD Assistants:Caroline McBane PA-C Location: [place] OR ROOM  5 Anesthesia: General with Exparel interscalene block Antibiotics: Ancef 2 g Tourniquet time: None Estimated Blood Loss: Minimal Complications: None Specimens: None Implants: Implant Name Type Inv. Item Serial No. Manufacturer Lot No. LRB No. Used Action  IMPLANT SPEEDBRIDGE KIT - [id number] Orthopedic Implant IMPLANT SPEEDBRIDGE KIT  [place] [id number] Right 1 Implanted  ANCHOR SUT BIO SW 4.75X19.1 - [id number] Anchor ANCHOR SUT BIO SW 4.75X19.1  [employer] [id number] Right 1 Implanted  ANCHOR SUT BIO SW 4.75X19.1 - [id number] Anchor ANCHOR SUT BIO SW 4.75X19.1  Selena May [id number] Right 1 Implanted    Indications for Surgery:   Selena May is a 69 y.o. female with continued shoulder pain refractory to nonoperative measures for extended period of time.    The risks and benefits were explained at length including but not limited to continued pain, cuff failure, biceps tenodesis failure, stiffness, need for further surgery and infection.   Procedure:   Patient was correctly identified in the preoperative holding area and operative site marked.  Patient brought to OR and positioned beachchair on an [place] table ensuring that all bony prominences were padded and the head was in an appropriate location.  Anesthesia was induced and the operative shoulder was prepped and draped in the usual sterile fashion.  Timeout was called preincision.  A standard posterior viewing portal was made after localizing the portal with a spinal needle.  An anterior accessory portal was also made.  After clearing the articular space the camera was positioned in the subacromial space.  Findings above.    Extensive debridement was performed of the anterior interval tissue, labral fraying and the bursa.  Subacromial decompression: We made a lateral portal with spinal needle guidance. We then proceeded to debride bursal tissue extensively with a shaver and arthrocare device. At that  point we continued to identify the  borders of the acromion and identify the spur. We then carefully preserved the deltoid fascia and used a burr to convert the acromion to a Type 1 flat acromion without issue.  Distal Clavicle resection:  The scope was placed in the subacromial space from the posterior portal.  A hemostat was placed through the anterior portal and we spread at the Selena May joint.  A burr was then inserted and 10 mm of distal clavicle was resected taking care to avoid damage to the capsule around the joint and avoiding overhanging bone posteriorly.    Subscapularis Repair: We identified a subscapularis tear that involved upper 30% of tendon.  Using a grasping device were able to demonstrate that the tendon could be reapproximated to the lesser tuberosity.  We felt that it was repairable.  We then used a RF ablator to open the rotator interval and released the MGHL to allow further translation of the subscapularis.  This point we cleared both anterior and posterior to the tendon taking care to avoid inferior migration to avoid the neurovascular structures as well as the muscular cutaneous nerve.  We then used a lasso to pass a fiber tape in a mattress fashion through the subscapularis and based 4.75 swivel lock was used to repair back to the lesser tuberosity after prepping the tuberosity extensively.  We had good approximation of the tendon and a recreation of the rolled border.   Arthroscopic Rotator Cuff Repair: Tuberosity was prepared with a burr to a bleeding bed.  Following completion of the above we placed 3 4.7 Swivelock anchor loaded with a tape at inserted at the medial articular margin and an scorpion suture passing device, shuttled  sutures medially in a horizontal mattress suture configuration.  We then tied using arthroscopic knot tying techniques  each suture to its partner reducing the tendon at the prepared insertion site.  The fiber tape was not tied. With a medial row suture limbs then incorporated, 3 anteriorly and   3 posteriorly, into each of two 4.75 PEEK SwiveLock anchors, each placed 8 to 10 mm below the tip of the tuberosity and spanning anterior-posterior width of the tear with care to avoid over tensioning.  The bite of the lateral anchors was relatively poor however they did not have movement when we tested the we felt that they were reasonable and upsizing to another anchor was likely not necessary.   The incisions were closed with absorbable monocryl and steri strips.  A sterile dressing was placed along with a sling. The patient was awoken from general anesthesia and taken to the PACU in stable condition without complication.   Noemi Chapel, PA-C, present and scrubbed throughout the case, critical for completion in a timely fashion, and for retraction, instrumentation, closure.

## 2019-04-24 NOTE — Transfer of Care (Signed)
Immediate Anesthesia Transfer of Care Note  Patient: Selena May  Procedure(s) Performed: RIGHT SHOULDER ARTHROSCOPY WITHDEBRIDMENT,  SUBACROMIAL DECOMPRESSION AND DISTAL CLAVICULECTOMY (Right Shoulder) RIGHT SHOULDER ARTHROSCOPY WITH ROTATOR CUFF REPAIR (Right Shoulder)  Patient Location: PACU  Anesthesia Type:GA combined with regional for post-op pain  Level of Consciousness: awake, alert  and oriented  Airway & Oxygen Therapy: Patient Spontanous Breathing and Patient connected to face mask oxygen  Post-op Assessment: Report given to RN and Post -op Vital signs reviewed and stable  Post vital signs: Reviewed and stable  Last Vitals:  Vitals Value Taken Time  BP 132/70 04/24/19 0916  Temp    Pulse 106 04/24/19 0918  Resp 24 04/24/19 0918  SpO2 100 % 04/24/19 0918  Vitals shown include unvalidated device data.  Last Pain:  Vitals:   04/24/19 0638  TempSrc: Oral  PainSc: 10-Worst pain ever      Patients Stated Pain Goal: 3 ([id number])  Complications: No apparent anesthesia complications

## 2019-04-24 NOTE — Anesthesia Procedure Notes (Signed)
Procedure Name: Intubation Date/Time: 04/24/2019 7:36 AM Performed by: Genelle Bal, CRNA Pre-anesthesia Checklist: Patient identified, Emergency Drugs available, Suction available and Patient being monitored Patient Re-evaluated:Patient Re-evaluated prior to induction Oxygen Delivery Method: Circle system utilized Preoxygenation: Pre-oxygenation with 100% oxygen Induction Type: IV induction Ventilation: Mask ventilation without difficulty Laryngoscope Size: Miller and 2 Grade View: Grade III Tube type: Oral Tube size: 7.0 mm Number of attempts: 1 Airway Equipment and Method: Stylet and Oral airway Placement Confirmation: ETT inserted through vocal cords under direct vision,  positive ETCO2 and breath sounds checked- equal and bilateral Secured at: 21 cm Tube secured with: Tape Dental Injury: Teeth and Oropharynx as per pre-operative assessment  Difficulty Due To: Difficult Airway- due to anterior larynx

## 2019-04-25 ENCOUNTER — Encounter: Payer: Self-pay | Admitting: *Deleted

## 2019-05-21 ENCOUNTER — Other Ambulatory Visit: Payer: Self-pay | Admitting: Sports Medicine

## 2019-05-21 DIAGNOSIS — M19011 Primary osteoarthritis, right shoulder: Secondary | ICD-10-CM

## 2019-06-04 ENCOUNTER — Other Ambulatory Visit: Payer: Self-pay | Admitting: Family Medicine

## 2019-06-11 ENCOUNTER — Ambulatory Visit (INDEPENDENT_AMBULATORY_CARE_PROVIDER_SITE_OTHER): Payer: Medicare HMO | Admitting: Osteopathic Medicine

## 2019-06-11 ENCOUNTER — Encounter: Payer: Self-pay | Admitting: Osteopathic Medicine

## 2019-06-11 VITALS — BP 145/82 | HR 64 | Temp 98.3°F | Wt 186.1 lb

## 2019-06-11 DIAGNOSIS — F339 Major depressive disorder, recurrent, unspecified: Secondary | ICD-10-CM

## 2019-06-11 DIAGNOSIS — M25519 Pain in unspecified shoulder: Secondary | ICD-10-CM | POA: Diagnosis not present

## 2019-06-11 DIAGNOSIS — G4700 Insomnia, unspecified: Secondary | ICD-10-CM

## 2019-06-11 DIAGNOSIS — N390 Urinary tract infection, site not specified: Secondary | ICD-10-CM

## 2019-06-11 MED ORDER — OXYCODONE-ACETAMINOPHEN 5-325 MG PO TABS: 1.0000 | ORAL_TABLET | Freq: Three times a day (TID) | ORAL | 0 refills | Status: AC | PRN

## 2019-06-11 MED ORDER — GABAPENTIN 300 MG PO CAPS: 300.0000 mg | ORAL_CAPSULE | Freq: Every day | ORAL | 3 refills | Status: DC

## 2019-06-11 MED ORDER — TRAZODONE HCL 50 MG PO TABS: 50.0000 mg | ORAL_TABLET | Freq: Every evening | ORAL | 3 refills | Status: DC | PRN

## 2019-06-11 MED ORDER — SERTRALINE HCL 50 MG PO TABS: 50.0000 mg | ORAL_TABLET | Freq: Every day | ORAL | 3 refills | Status: DC

## 2019-06-11 MED ORDER — OXYCODONE-ACETAMINOPHEN 5-325 MG PO TABS: 1.0000 | ORAL_TABLET | Freq: Three times a day (TID) | ORAL | 0 refills | Status: DC | PRN

## 2019-06-11 NOTE — Progress Notes (Signed)
Selena May is a 69 y.o. female who presents to  [place] at [facility]  today, 06/11/19, seeking care for the following: Marland Kitchen Med refills: anx/dep, sleep, RLS . Shoulder pain  . Recurrent UTI concerns      ASSESSMENT & PLAN with other pertinent history/findings:  The primary encounter diagnosis was Shoulder pain, unspecified chronicity, unspecified laterality. Diagnoses of Depression, recurrent ([place]), Recurrent UTI, and Insomnia, unspecified type were also pertinent to this visit.   New concern: . Sleeping an issue, worse given recent shoulder surgery, pain is intermittent   Chronic, stable: Marland Kitchen Mental health: stable requests refill meds    Chronic with exacerbation:  . Recurrent UTI, reports recent treatment few mos ago, recent abx w/ surgery. She uses vaginal estrogen bu not very regularly. Previously seen by urologist he is not longer in practice     Patient Instructions  Plan:  Sleep: will trial pain meds as needed, can increase Trazodone from 50 gm to 100 mg in evenings. I hesitate to prescribe Lunesta or similar because this is a lot of sedation with the pain meds, Trazodone, Gabapentin.   Pain: See contract for opiate Rx. Will work on tapering off once you are done with physical therapy.  UTI: Urine testing today. Continue the estrogen. Will consider antibiotics based on culture results, should be back in 2-3 days.       Orders Placed This Encounter  Procedures  . Urine Culture  . Pain Mgmt, Profile 5 w/o Conf, U  . Urinalysis, Routine w reflex microscopic    Meds ordered this encounter  Medications  . gabapentin (NEURONTIN) 300 MG capsule    Sig: Take 1 capsule (300 mg total) by mouth at bedtime. RESTLESS LEGS    Dispense:  90 capsule    Refill:  3  . DISCONTD: oxyCODONE-acetaminophen (PERCOCET/ROXICET) 5-325 MG tablet    Sig: Take 1-2 tablets by mouth every 8 (eight) hours as needed for up to 5 days for  severe pain.    Dispense:  20 tablet    Refill:  0  . traZODone (DESYREL) 50 MG tablet    Sig: Take 1-2 tablets (50-100 mg total) by mouth at bedtime as needed for sleep.    Dispense:  90 tablet    Refill:  3  . sertraline (ZOLOFT) 50 MG tablet    Sig: Take 1 tablet (50 mg total) by mouth daily.    Dispense:  90 tablet    Refill:  3  . oxyCODONE-acetaminophen (PERCOCET/ROXICET) 5-325 MG tablet    Sig: Take 1-2 tablets by mouth every 8 (eight) hours as needed for up to 5 days for severe pain.    Dispense:  20 tablet    Refill:  0       Follow-up instructions: Return in about 4 weeks (around 07/09/2019) for recheck on pain medications, see me sooner if needed .                                         BP (!) 145/82 (BP Location: Left Arm, Patient Position: Sitting, Cuff Size: Normal)   Pulse 64   Temp 98.3 F (36.8 C) (Oral)   Wt 186 lb 1.9 oz (84.4 kg)   BMI 35.17 kg/m   Current Meds  Medication Sig  . diclofenac sodium (VOLTAREN) 1 % GEL Apply 4 g topically 4 (four)  times daily. To affected joint.  . IRON PO Take by mouth.  . meloxicam (MOBIC) 7.5 MG tablet Take 1 tablet (7.5 mg total) by mouth daily.  . ondansetron (ZOFRAN-ODT) 8 MG disintegrating tablet Take 1 tablet (8 mg total) by mouth every 8 (eight) hours as needed for nausea.  . sertraline (ZOLOFT) 50 MG tablet Take 1 tablet (50 mg total) by mouth daily.  . traZODone (DESYREL) 50 MG tablet Take 1-2 tablets (50-100 mg total) by mouth at bedtime as needed for sleep.  . [DISCONTINUED] sertraline (ZOLOFT) 50 MG tablet Take 1 tablet (50 mg total) by mouth daily. **PATIENT NEEDS OFFICE VISIT FOR ADDITIONAL REFILLS**  . [DISCONTINUED] traZODone (DESYREL) 50 MG tablet Take 0.5-1 tablets (25-50 mg total) by mouth at bedtime as needed for sleep.    No results found for this or any previous visit (from the past 72 hour(s)).  No results found.  Depression screen [facility] 2/9 03/04/2018  03/28/2017 03/28/2017  Decreased Interest 0 0 0  Down, Depressed, Hopeless 0 0 0  PHQ - 2 Score 0 0 0  Altered sleeping 3 - -  Tired, decreased energy 3 - -  Change in appetite 2 - -  Feeling bad or failure about yourself  0 - -  Trouble concentrating 0 - -  Moving slowly or fidgety/restless 0 - -  Suicidal thoughts 0 - -  PHQ-9 Score 8 - -  Difficult doing work/chores Not difficult at all - -    GAD 7 : Generalized Anxiety Score 03/04/2018  Nervous, Anxious, on Edge 0  Control/stop worrying 0  Worry too much - different things 0  Trouble relaxing 0  Restless 0  Easily annoyed or irritable 0  Afraid - awful might happen 0  Total GAD 7 Score 0  Anxiety Difficulty Not difficult at all      All questions at time of visit were answered - patient instructed to contact office with any additional concerns or updates.  ER/RTC precautions were reviewed with the patient.  Please note: voice recognition software was used to produce this document, and typos may escape review. Please contact Dr. Sheppard Coil for any needed clarifications.

## 2019-06-11 NOTE — Patient Instructions (Signed)
Plan:  Sleep: will trial pain meds as needed, can increase Trazodone from 50 gm to 100 mg in evenings. I hesitate to prescribe Lunesta or similar because this is a lot of sedation with the pain meds, Trazodone, Gabapentin.   Pain: See contract for opiate Rx. Will work on tapering off once you are done with physical therapy.  UTI: Urine testing today. Continue the estrogen. Will consider antibiotics based on culture results, should be back in 2-3 days.

## 2019-06-12 MED ORDER — NITROFURANTOIN MONOHYD MACRO 100 MG PO CAPS: 100.0000 mg | ORAL_CAPSULE | Freq: Two times a day (BID) | ORAL | 0 refills | Status: DC

## 2019-06-12 NOTE — Addendum Note (Signed)
Addended by: Maryla Morrow on: 06/12/2019 07:29 AM   Modules accepted: Orders

## 2019-06-13 LAB — PAIN MGMT, PROFILE 5 W/O CONF, U
Amphetamines: NEGATIVE ng/mL
Barbiturates: NEGATIVE ng/mL
Benzodiazepines: NEGATIVE ng/mL
Cocaine Metabolite: NEGATIVE ng/mL
Creatinine: 82.3 mg/dL
Marijuana Metabolite: NEGATIVE ng/mL
Methadone Metabolite: NEGATIVE ng/mL
Opiates: NEGATIVE ng/mL
Oxidant: NEGATIVE ug/mL
Oxycodone: NEGATIVE ng/mL
pH: 5.6 (ref 4.5–9.0)

## 2019-06-13 LAB — URINALYSIS, ROUTINE W REFLEX MICROSCOPIC
Bilirubin Urine: NEGATIVE
Glucose, UA: NEGATIVE
Hgb urine dipstick: NEGATIVE
Hyaline Cast: NONE SEEN /LPF
Ketones, ur: NEGATIVE
Nitrite: POSITIVE — AB
Protein, ur: NEGATIVE
RBC / HPF: NONE SEEN /HPF (ref 0–2)
Specific Gravity, Urine: 1.015 (ref 1.001–1.03)
Squamous Epithelial / HPF: NONE SEEN /HPF (ref <=5)
WBC, UA: >=60 /HPF — AB (ref 0–5)
pH: 5.5 (ref 5.0–8.0)

## 2019-06-13 LAB — URINE CULTURE
MICRO NUMBER:: 10237326
SPECIMEN QUALITY:: ADEQUATE

## 2019-06-13 MED ORDER — NITROFURANTOIN MACROCRYSTAL 100 MG PO CAPS: 100.0000 mg | ORAL_CAPSULE | Freq: Every day | ORAL | 1 refills | Status: DC

## 2019-06-13 NOTE — Addendum Note (Signed)
Addended by: Maryla Morrow on: 06/13/2019 05:42 PM   Modules accepted: Orders

## 2019-06-16 ENCOUNTER — Ambulatory Visit: Payer: Medicare HMO | Admitting: Rehabilitative and Restorative Service Providers"

## 2019-06-18 ENCOUNTER — Ambulatory Visit (INDEPENDENT_AMBULATORY_CARE_PROVIDER_SITE_OTHER): Payer: Medicare HMO | Admitting: Rehabilitative and Restorative Service Providers"

## 2019-06-18 ENCOUNTER — Other Ambulatory Visit: Payer: Self-pay

## 2019-06-18 DIAGNOSIS — M6281 Muscle weakness (generalized): Secondary | ICD-10-CM

## 2019-06-18 DIAGNOSIS — R293 Abnormal posture: Secondary | ICD-10-CM | POA: Diagnosis not present

## 2019-06-18 DIAGNOSIS — M25511 Pain in right shoulder: Secondary | ICD-10-CM

## 2019-06-18 DIAGNOSIS — M25411 Effusion, right shoulder: Secondary | ICD-10-CM

## 2019-06-18 DIAGNOSIS — R29898 Other symptoms and signs involving the musculoskeletal system: Secondary | ICD-10-CM

## 2019-06-18 NOTE — Patient Instructions (Signed)
Access Code: [email or web address]: [email or web address]: 03/17/2021Prepared by: [place]  Circular Shoulder Pendulum with Table Support - 3 x daily - 7 x weekly - 1 sets - 10 reps  Seated Scapular Retraction - 3 x daily - 7 x weekly - 1 sets - 10 reps - 5 hold  Seated Shoulder External Rotation AAROM with Dowel - 2-3 x daily - 7 x weekly - 1 sets - 10 reps - 5 hold  Seated Shoulder Flexion Slide at Table Top with Forearm in Neutral - 2-3 x daily - 7 x weekly - 1 sets - 10 reps - 5-10 hold

## 2019-06-18 NOTE — Therapy (Signed)
[place], [place], [address] Phone: [phone]   Fax:  [phone]  Physical Therapy Evaluation  Patient Details  Name: Selena May MRN: [id number] Date of Birth: [birth year] Referring Provider (PT): Dr Ophelia Charter    Encounter Date: 06/18/2019  PT End of Session - 06/18/19 1414    Visit Number  1    Number of Visits  24    Date for PT Re-Evaluation  09/09/19    PT Start Time  [id number]    PT Stop Time  [id number]   Simultaneous filing. User may not have seen previous data.   PT Time Calculation (min)  42 min   Simultaneous filing. User may not have seen previous data.   Activity Tolerance  Patient tolerated treatment well       Past Medical History:  Diagnosis Date  . Anxiety   . Impingement syndrome of right shoulder   . Iron deficiency anemia 12/15/2011  . Leukocytosis 12/15/2011  . Osteopenia determined by x-ray 06/27/2017   March 2019 T score -2.3  . Pyelonephritis   . 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 Sleep apnea    lost weight and does not use CPAP now  . 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 VUR (vesicoureteric reflux) 11/30/2011    Past Surgical History:  Procedure Laterality Date  . ABDOMINAL HYSTERECTOMY    . APPENDECTOMY    . CHOLECYSTECTOMY    . GASTRIC BYPASS    . KIDNEY SURGERY    . KNEE SURGERY     left x 2 right x 1  . SHOULDER ARTHROSCOPY WITH ROTATOR CUFF REPAIR Right 04/24/2019   Procedure: RIGHT SHOULDER ARTHROSCOPY WITH ROTATOR CUFF REPAIR;  Surgeon: Hiram Gash, MD;  Location: [facility];  Service: Orthopedics;  Laterality: Right;  . TONSILLECTOMY      There were no vitals filed for this visit.   Subjective Assessment - 06/18/19 1348    Subjective  Patient reports that she had shoulder injury from a fall ~ 2 years. Symptoms gradually worsened. She underwent Rt RCT, DCR, SAD 04/24/19. Post op course uncomplicated    Pertinent History  LBP and knee  pain from fall ~ 2 years ago; arthritis    Patient Stated Goals  using Rt arm for functional activities    Currently in Pain?  Yes    Pain Score  4     Pain Location  Shoulder    Pain Orientation  Right    Pain Descriptors / Indicators  Sharp;Stabbing    Pain Type  Surgical pain;Chronic pain    Pain Radiating Towards  neck to wrist    Pain Onset  More than a month ago    Pain Frequency  Constant    Aggravating Factors   anything and everything    Pain Relieving Factors  medication; propped on a pillow close to body         [facility] PT Assessment - 06/18/19 0001      Assessment   Medical Diagnosis  Rt RCR, DCR, SAD    Referring Provider (PT)  Dr Ophelia Charter     Onset Date/Surgical Date  04/24/19    Hand Dominance  Right    Next MD Visit  07/17/19    Prior Therapy  here for LBP; knee pain       Precautions   Precautions  Shoulder    Type of Shoulder Precautions  per protocol  Balance Screen   Has the patient fallen in the past 6 months  No    Has the patient had a decrease in activity level because of a fear of falling?   No    Is the patient reluctant to leave their home because of a fear of falling?   No      Home Film/video editor residence    Living Arrangements  Spouse/significant other    Type of [place] to enter    Entrance Stairs-Number of Steps  2-3 steps     Entrance Stairs-Rails  Can reach both    [place]  One level      Prior Function   Level of Independence  Independent    Vocation  Retired    Biomedical scientist  retired Engineer, structural     Leisure  household chores; otherwise sedentary       Observation/Other Assessments   Focus on Therapeutic Outcomes (FOTO)   66% limitation       Observation/Other Assessments-Edema    Edema  --   minimal Rt shoulder and hand      Sensation   Additional Comments  WFl's per pt report       Posture/Postural Control   Posture Comments  head forward;  shoulders rounded       AROM   Right Shoulder Extension  21 Degrees    Right Shoulder Flexion  64 Degrees    Right Shoulder ABduction  54 Degrees    Right Shoulder Internal Rotation  --   lateral hip    Right Shoulder External Rotation  17 Degrees    Left Shoulder Extension  59 Degrees    Left Shoulder Flexion  135 Degrees    Left Shoulder ABduction  127 Degrees    Left Shoulder Internal Rotation  --   thumb to T9   Left Shoulder External Rotation  42 Degrees      Strength   Overall Strength Comments  NT - Rt UE due to surgery; Lt UE - WFL's       Palpation   Palpation comment  muscular tightness through the Rt upper quarter                 Objective measurements completed on examination: See above findings.      [facility] Adult PT Treatment/Exercise - 06/18/19 0001      Exercises   Exercises  Shoulder      Shoulder Exercises: Seated   External Rotation  AAROM;Right;10 reps   5-10 sec hold, with cane     Shoulder Exercises: Standing   Other Standing Exercises  shoulder blade squeeze with back against pool noodle x 5 sec x 10 reps     Other Standing Exercises  pendulum x 10 circles CW/CCW -cues to relax arm.       Shoulder Exercises: Stretch   Table Stretch - Flexion  5 reps;10 seconds             PT Education - 06/18/19 1410    Education Details  POC, HEP    Person(s) Educated  Patient    Methods  Explanation;Handout;Demonstration;Verbal cues    Comprehension  Verbalized understanding;Returned demonstration;Verbal cues required       PT Short Term Goals - 06/18/19 1421      PT SHORT TERM GOAL #1   Title  Initiate HEP per post op protocol with patient to  demonstrate independence in exercises    Time  6    Period  Weeks    Status  New    Target Date  07/29/19      PT SHORT TERM GOAL #2   Title  Increase PROM to 100-120 deg flexion and 25-30 deg ER    Time  6    Period  Weeks    Status  New    Target Date  07/29/19        PT Long Term  Goals - 06/18/19 1423      PT LONG TERM GOAL #1   Title  Improve posture and alignment with patient to demonstrate improved upright posture with posterior shoulder girdle engaged    Time  12    Period  Weeks    Status  New    Target Date  09/09/19      PT LONG TERM GOAL #2   Title  Patient to report decresaed pain by 75-80% to improve sleep and functional activities    Time  12    Period  Weeks    Status  New    Target Date  09/09/19      PT LONG TERM GOAL #3   Title  Increase AROM Rt shoulder to WFLs to allow functional use of Rt UE for ADL's and personal care    Time  12    Period  Weeks    Status  New    Target Date  09/09/19      PT LONG TERM GOAL #4   Time  12    Period  Weeks    Status  New    Target Date  09/09/19      PT LONG TERM GOAL #5   Title  Improve FOTO to </= 42% limitation    Time  12    Period  Weeks    Status  New    Target Date  09/09/19             Plan - 06/18/19 1416    Clinical Impression Statement  Patient presents s/p RCR, DCR, SAD and debridement Rt shoulder 0000000 with an uncomplicated post op course. She has poor posture and alignment; limited AROM and decreased strength Rt UE; decreased functional activity level; pain Rt cervical/shoulder UE. Patient will benefit from PT to address problems identified.    Stability/Clinical Decision Making  Stable/Uncomplicated    Clinical Decision Making  Low    Rehab Potential  Good    PT Frequency  2x / week    PT Duration  12 weeks    PT Treatment/Interventions  ADLs/Self Care Home Management;Patient/family education;Cryotherapy;Electrical Stimulation;Iontophoresis 4mg /ml Dexamethasone;Moist Heat;Ultrasound;Therapeutic activities;Therapeutic exercise;Neuromuscular re-education;Manual techniques;Passive range of motion;Dry needling;Taping;Vasopneumatic Device    PT Next Visit Plan  review HEP; manual therapy including PROM/stretching; progress with exercise per protocol; manual work and myofacial  ball release work Rt upper quarter; modalities as indicated; education re- shoulder precautions post operatively    PT Home Exercise Plan  [email or web address]    Consulted and Agree with Plan of Care  Patient       Patient will benefit from skilled therapeutic intervention in order to improve the following deficits and impairments:  Decreased range of motion, Pain, Impaired UE functional use, Improper body mechanics, Decreased mobility, Decreased strength, Postural dysfunction  Visit Diagnosis: Right shoulder pain, unspecified chronicity  Muscle weakness (generalized)  Other symptoms and signs involving the musculoskeletal system  Abnormal posture     Problem  List Patient Active Problem List   Diagnosis Date Noted  . Primary osteoarthritis, right shoulder 02/10/2019  . Osteopenia determined by x-ray 06/27/2017  . 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 Aortic atherosclerosis ([place]) 03/06/2017  . Encounter for examination of blood pressure with abnormal findings 04/05/2016  . Orthostatic hypotension 03/24/2016  . 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 Murmur, cardiac 03/24/2016  . 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 Complicated UTI (urinary tract infection) 10/29/2015  . 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 Dysfunction of left eustachian tube 02/08/2015  . Major depression in remission ([place]) 01/10/2012  . Obesity 12/21/2011  . Iron deficiency anemia 12/15/2011  . 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 Leukocytosis 12/15/2011  . 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 VUR (vesicoureteric reflux) 11/30/2011    Man Effertz Nilda Simmer PT, MPH  06/18/2019, 2:29 PM  [facility] [place], [place], [address] Phone: [phone]   Fax:  [phone]  Name: Selena May MRN: [id number] Date of Birth: 08-11-1950

## 2019-06-23 ENCOUNTER — Other Ambulatory Visit: Payer: Self-pay

## 2019-06-23 ENCOUNTER — Encounter: Payer: Self-pay | Admitting: Physical Therapy

## 2019-06-23 ENCOUNTER — Ambulatory Visit: Payer: Medicare HMO | Admitting: Physical Therapy

## 2019-06-23 DIAGNOSIS — M6281 Muscle weakness (generalized): Secondary | ICD-10-CM | POA: Diagnosis not present

## 2019-06-23 DIAGNOSIS — M25511 Pain in right shoulder: Secondary | ICD-10-CM | POA: Diagnosis not present

## 2019-06-23 NOTE — Therapy (Addendum)
[place], [place], [address] Phone: [phone]   Fax:  [phone]  Physical Therapy Treatment  Patient Details  Name: Selena May MRN: [id number] Date of Birth: 1950/06/04 Referring Provider (PT): Dr Ophelia Charter    Encounter Date: 06/23/2019  PT End of Session - 06/23/19 1430    Visit Number  2    Number of Visits  24    Date for PT Re-Evaluation  09/09/19    PT Start Time  1430    PT Stop Time  1519    PT Time Calculation (min)  49 min    Activity Tolerance  Patient tolerated treatment well       Past Medical History:  Diagnosis Date  . Anxiety   . Impingement syndrome of right shoulder   . Iron deficiency anemia 12/15/2011  . Leukocytosis 12/15/2011  . Osteopenia determined by x-ray 06/27/2017   March 2019 T score -2.3  . Pyelonephritis   . 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 Sleep apnea    lost weight and does not use CPAP now  . 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 VUR (vesicoureteric reflux) 11/30/2011    Past Surgical History:  Procedure Laterality Date  . ABDOMINAL HYSTERECTOMY    . APPENDECTOMY    . CHOLECYSTECTOMY    . GASTRIC BYPASS    . KIDNEY SURGERY    . KNEE SURGERY     left x 2 right x 1  . SHOULDER ARTHROSCOPY WITH ROTATOR CUFF REPAIR Right 04/24/2019   Procedure: RIGHT SHOULDER ARTHROSCOPY WITH ROTATOR CUFF REPAIR;  Surgeon: Hiram Gash, MD;  Location: [facility];  Service: Orthopedics;  Laterality: Right;  . TONSILLECTOMY      There were no vitals filed for this visit.  Subjective Assessment - 06/23/19 1430    Subjective  Pt reports her Rt shoulder has been sore and the pain medication is making her sick to her stomach.  She states the exercises have been painful.    Patient Stated Goals  using Rt arm for functional activities    Currently in Pain?  Yes    Pain Score  8     Pain Location  Shoulder    Pain Orientation  Right    Pain  Descriptors / Indicators  Sharp    Aggravating Factors   everything    Pain Relieving Factors  medication, propped on a pillow, ice         [facility] PT Assessment - 06/23/19 0001      Assessment   Medical Diagnosis  Rt RCR, DCR, SAD    Referring Provider (PT)  Dr Ophelia Charter     Onset Date/Surgical Date  04/24/19    Hand Dominance  Right    Next MD Visit  07/17/19    Prior Therapy  here for LBP; knee pain       Precautions   Type of Shoulder Precautions  per protocol       ROM / Strength   AROM / PROM / Strength  PROM;AROM      PROM   PROM Assessment Site  Shoulder    Right/Left Shoulder  Right    Right Shoulder Flexion  135 Degrees   AAROM with cane   Right Shoulder External Rotation  45 Degrees   supported scaption, AAROM with cane      [facility] Adult PT Treatment/Exercise - 06/23/19 0001      Shoulder Exercises:  Supine   Horizontal ABduction  AAROM;Both;10 reps   cane   External Rotation  Right;AAROM;10 reps   cane   Flexion  AAROM;Right;10 reps   cane   ABduction  AAROM;Right;10 reps   cane   Other Supine Exercises  press ups with cane in bilat hands x 10 reps    Other Supine Exercises  scap retraction x 5 sec hold x 10       Shoulder Exercises: Seated   External Rotation  AAROM;Right   poor form, using lumbar rotation, switched to supine   Other Seated Exercises  shoulder rolls x10      Shoulder Exercises: Standing   Internal Rotation  AAROM;10 reps   cane behind back, and up over buttocks. 2 sets   Extension  AAROM;Both;10 reps   cane     Shoulder Exercises: Pulleys   Flexion  3 minutes    Scaption  2 minutes      Modalities   Modalities  Vasopneumatic      Vasopneumatic   Number Minutes Vasopneumatic   10 minutes    Vasopnuematic Location   Shoulder    Vasopneumatic Pressure  Low    Vasopneumatic Temperature   34 deg      Manual Therapy   Manual Therapy  Soft tissue mobilization;Passive ROM    Soft tissue mobilization  STM to Rt ant shoulder,  pec, bicep, upper trap.      Passive ROM  Rt shoulder ER, Scaption, Flexion, horiz abdct - within tissue limits and no pain.  gentle circumduction to decrease guarding              PT Education - 06/23/19 1513    Education Details  updated HEP    Person(s) Educated  Patient    Methods  Explanation;Handout;Demonstration;Verbal cues;Tactile cues    Comprehension  Verbalized understanding;Returned demonstration       PT Short Term Goals - 06/18/19 1421      PT SHORT TERM GOAL #1   Title  Initiate HEP per post op protocol with patient to demonstrate independence in exercises    Time  6    Period  Weeks    Status  New    Target Date  07/29/19      PT SHORT TERM GOAL #2   Title  Increase PROM to 100-120 deg flexion and 25-30 deg ER    Time  6    Period  Weeks    Status  New    Target Date  07/29/19        PT Long Term Goals - 06/18/19 1423      PT LONG TERM GOAL #1   Title  Improve posture and alignment with patient to demonstrate improved upright posture with posterior shoulder girdle engaged    Time  12    Period  Weeks    Status  New    Target Date  09/09/19      PT LONG TERM GOAL #2   Title  Patient to report decresaed pain by 75-80% to improve sleep and functional activities    Time  12    Period  Weeks    Status  New    Target Date  09/09/19      PT LONG TERM GOAL #3   Title  Increase AROM Rt shoulder to WFLs to allow functional use of Rt UE for ADL's and personal care    Time  12    Period  Weeks  Status  New    Target Date  09/09/19      PT LONG TERM GOAL #4   Time  12    Period  Weeks    Status  New    Target Date  09/09/19      PT LONG TERM GOAL #5   Title  Improve FOTO to </= 42% limitation    Time  12    Period  Weeks    Status  New    Target Date  09/09/19            Plan - 06/23/19 1451    Clinical Impression Statement  Pt was able to tolerate all AAROM exercises without increase in pain.  She reported reduction in pain in  shoulder after gentle PROM/ STM and further reduction with vaso at end of session.  Progressing towards LTGs.    Stability/Clinical Decision Making  Stable/Uncomplicated    Rehab Potential  Good    PT Frequency  2x / week    PT Duration  12 weeks    PT Treatment/Interventions  ADLs/Self Care Home Management;Patient/family education;Cryotherapy;Electrical Stimulation;Iontophoresis 31m/ml Dexamethasone;Moist Heat;Ultrasound;Therapeutic activities;Therapeutic exercise;Neuromuscular re-education;Manual techniques;Passive range of motion;Dry needling;Taping;Vasopneumatic Device    PT Next Visit Plan  manual therapy including PROM/stretching; progress with exercise per protocol;  education re- shoulder precautions post operatively    PT Home Exercise Plan  [id number]    Consulted and Agree with Plan of Care  Patient       Patient will benefit from skilled therapeutic intervention in order to improve the following deficits and impairments:  Decreased range of motion, Pain, Impaired UE functional use, Improper body mechanics, Decreased mobility, Decreased strength, Postural dysfunction  Visit Diagnosis: Right shoulder pain, unspecified chronicity  Muscle weakness (generalized)     Problem List Patient Active Problem List   Diagnosis Date Noted  . Primary osteoarthritis, right shoulder 02/10/2019  . Osteopenia determined by x-ray 06/27/2017  . 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 Aortic atherosclerosis ([place] 03/06/2017  . Encounter for examination of blood pressure with abnormal findings 04/05/2016  . Orthostatic hypotension 03/24/2016  . 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 Murmur, cardiac 03/24/2016  . 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 Complicated UTI (urinary tract infection) 10/29/2015  . Pituitary mass ([place] 08/16/2015  . Dysfunction of left eustachian tube 02/08/2015  . Major depression in remission ([place] 01/10/2012  . Obesity 12/21/2011  . Iron deficiency  anemia 12/15/2011  . 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 Leukocytosis 12/15/2011  . 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 VUR (vesicoureteric reflux) 11/30/2011   JKerin Perna PTA 06/23/19 5:07 PM  [place] [address]Phone: [phone]  Fax:  [phone] Name: Selena BUCHANANMRN: [id number] of Birth: [birth year] PHYSICAL THERAPY DISCHARGE SUMMARY  Visits from Start of Care: 2  Current functional level related to goals / functional outcomes: See progress note for discharge status    Remaining deficits: Unknown    Education / Equipment: Initial HEP; post op precautions  Plan: Patient agrees to discharge.  Patient goals were not met. Patient is being discharged due to not returning since the last visit.  ?????     Celyn P. HHelene KelpPT, MPH 08/01/19 8:32 AM

## 2019-06-23 NOTE — Patient Instructions (Signed)
Access Code: [email or web address]: [email or web address]: 03/22/2021Prepared by: Ellis  Circular Shoulder Pendulum with Table Support - 3 x daily - 7 x weekly - 1 sets - 10 reps  Seated Scapular Retraction - 3 x daily - 7 x weekly - 1 sets - 10 reps - 5 hold  Seated Shoulder Flexion Slide at Table Top with Forearm in Neutral - 2-3 x daily - 7 x weekly - 1 sets - 10 reps - 5-10 hold  Seated Shoulder Flexion AAROM with Pulley Behind - 2 x daily - 7 x weekly - 1 sets - 10 reps - 5 hold  Seated Shoulder Scaption AAROM with Pulley at Side - 2 x daily - 7 x weekly - 1 sets - 10 reps - 5 hold  Supine Shoulder External Rotation in 45 Degrees Abduction AAROM with Dowel - 2 x daily - 7 x weekly - 3 sets - 10 reps - 5 hold  Supine Shoulder Flexion Extension AAROM with Dowel - 2 x daily - 7 x weekly - 3 sets - 10 reps - 5 hold  Supine Shoulder Horizontal Abduction Adduction AAROM with Dowel - 2 x daily - 7 x weekly - 3 sets - 10 reps - 5 hold  Standing Shoulder Extension ROM with Dowel - 2 x daily - 7 x weekly - 3 sets - 10 reps - 5 hold

## 2019-06-25 ENCOUNTER — Encounter: Payer: Medicare HMO | Admitting: Physical Therapy

## 2019-06-25 ENCOUNTER — Telehealth: Payer: Self-pay | Admitting: Physical Therapy

## 2019-06-25 ENCOUNTER — Other Ambulatory Visit: Payer: Self-pay

## 2019-06-25 NOTE — Telephone Encounter (Signed)
Patient did not show for PT appointment.  Called and left HIPAA compliant voice mail regarding missed appt and requested she call our office to confirm upcoming appt on 3/29.  OP rehab number left.  Kerin Perna, PTA 06/25/19 2:57 PM

## 2019-06-30 ENCOUNTER — Encounter: Payer: Medicare HMO | Admitting: Rehabilitative and Restorative Service Providers"

## 2019-06-30 ENCOUNTER — Telehealth: Payer: Self-pay | Admitting: Rehabilitative and Restorative Service Providers"

## 2019-06-30 NOTE — Telephone Encounter (Signed)
Selena May had an appointment in Physical Therapy today at 2:30 and did not show for her appointment. Left message asking that patient contact our office to confirm appointment this week.   Niamh Rada P. Helene Kelp PT, MPH 06/30/19 2:54 PM

## 2019-07-02 ENCOUNTER — Encounter: Payer: Medicare HMO | Admitting: Rehabilitative and Restorative Service Providers"

## 2019-07-07 ENCOUNTER — Encounter: Payer: Medicare HMO | Admitting: Rehabilitative and Restorative Service Providers"

## 2019-07-09 ENCOUNTER — Ambulatory Visit: Payer: Medicare HMO | Admitting: Osteopathic Medicine

## 2019-07-10 ENCOUNTER — Encounter: Payer: Medicare HMO | Admitting: Physical Therapy

## 2019-07-14 ENCOUNTER — Encounter: Payer: Medicare HMO | Admitting: Rehabilitative and Restorative Service Providers"

## 2019-07-16 ENCOUNTER — Telehealth: Payer: Self-pay

## 2019-07-16 DIAGNOSIS — N390 Urinary tract infection, site not specified: Secondary | ICD-10-CM

## 2019-07-16 NOTE — Telephone Encounter (Signed)
Spoke with pt who states she has been taking the nitrofurantoin 100 mg daily and it is "not doing any good". Pt states she used to see a Dealer but he is no longer in the area. Pt is agreeable to a new referral to Urology but also wants to know what she can do in the meantime.

## 2019-07-16 NOTE — Telephone Encounter (Signed)
[place] (1st attempt)    Pt called and LVM stating that she still has a kidney infection. She saw Dr. Sheppard Coil on 06/11/2019 and a UCx was done. Dr. Sheppard Coil informed her that the atbx she was Rx to take daily for the next 6 months should help and that if she has UTI despite the atbx that we would refer her to Urology. Need to find out if she has been taking the atbx on a daily basis and what exactly her sx are.   Looking in her chart, she has been in touch with [facility] already this morning.

## 2019-07-16 NOTE — Telephone Encounter (Signed)
Referral is in Needs OV for vitals and provide urine specimen if other concerns

## 2019-07-17 ENCOUNTER — Other Ambulatory Visit: Payer: Self-pay | Admitting: Medical-Surgical

## 2019-07-17 ENCOUNTER — Encounter: Payer: Medicare HMO | Admitting: Physical Therapy

## 2019-07-17 DIAGNOSIS — N390 Urinary tract infection, site not specified: Secondary | ICD-10-CM

## 2019-07-17 NOTE — Telephone Encounter (Signed)
Sending to  PCP

## 2019-07-17 NOTE — Telephone Encounter (Signed)
Pt aware referral has been entered and to expect a call for scheduling. Pt states she would like to come in for an OV. Call transferred to the front desk for scheduling.

## 2019-07-18 ENCOUNTER — Ambulatory Visit (INDEPENDENT_AMBULATORY_CARE_PROVIDER_SITE_OTHER): Payer: Medicare HMO | Admitting: Medical-Surgical

## 2019-07-18 ENCOUNTER — Other Ambulatory Visit: Payer: Self-pay

## 2019-07-18 ENCOUNTER — Encounter: Payer: Self-pay | Admitting: Medical-Surgical

## 2019-07-18 VITALS — BP 143/77 | HR 65 | Temp 97.9°F | Ht 61.5 in | Wt 189.0 lb

## 2019-07-18 DIAGNOSIS — N390 Urinary tract infection, site not specified: Secondary | ICD-10-CM

## 2019-07-18 DIAGNOSIS — R300 Dysuria: Secondary | ICD-10-CM

## 2019-07-18 LAB — POCT URINALYSIS DIP (CLINITEK)
Bilirubin, UA: NEGATIVE
Glucose, UA: NEGATIVE mg/dL
Ketones, POC UA: NEGATIVE mg/dL
Nitrite, UA: POSITIVE — AB
POC PROTEIN,UA: NEGATIVE
Spec Grav, UA: 1.02 (ref 1.010–1.025)
Urobilinogen, UA: 0.2 E.U./dL
pH, UA: 6 (ref 5.0–8.0)

## 2019-07-18 MED ORDER — CEPHALEXIN 500 MG PO TABS: ORAL_TABLET | ORAL | 0 refills | Status: DC

## 2019-07-18 MED ORDER — CEPHALEXIN 250 MG PO TABS: ORAL_TABLET | ORAL | 3 refills | Status: DC

## 2019-07-18 NOTE — Progress Notes (Addendum)
Subjective:    CC: Dysuria  HPI: Pleasant 69 year old female presenting today with reports worsening dysuria, frequency, urgency, low back pain, and nausea.  Denies fever, chills, vomiting.  She has a history of recurrent UTIs and was recently prescribed continuous nitrofurantoin for prophylaxis.  She reports that she has never tolerated nitrofurantoin well as it causes her GI side effects.  Reports that she did not receive the continuous nitrofurantoin prescription and her pharmacy said she did not have refills.  Previously under the care of urology who treated her UTIs with cephalexin as this was one of the only medications that worked for her.  Referral in place for [facility] urology evaluation, patient awaiting initial contact with them.  I reviewed the past medical history, family history, social history, surgical history, and allergies today and no changes were needed.  Please see the problem list section below in epic for further details.  Past Medical History: Past Medical History:  Diagnosis Date  . Anxiety   . Impingement syndrome of right shoulder   . Iron deficiency anemia 12/15/2011  . Leukocytosis 12/15/2011  . Osteopenia determined by x-ray 06/27/2017   March 2019 T score -2.3  . Pyelonephritis   . 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 Sleep apnea    lost weight and does not use CPAP now  . 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 VUR (vesicoureteric reflux) 11/30/2011   Past Surgical History: Past Surgical History:  Procedure Laterality Date  . ABDOMINAL HYSTERECTOMY    . APPENDECTOMY    . CHOLECYSTECTOMY    . GASTRIC BYPASS    . KIDNEY SURGERY    . KNEE SURGERY     left x 2 right x 1  . SHOULDER ARTHROSCOPY WITH ROTATOR CUFF REPAIR Right 04/24/2019   Procedure: RIGHT SHOULDER ARTHROSCOPY WITH ROTATOR CUFF REPAIR;  Surgeon: Hiram Gash, MD;  Location: [facility];  Service: Orthopedics;  Laterality: Right;  . TONSILLECTOMY     Social History: Social History    Socioeconomic History  . Marital status: Widowed    Spouse name: Not on file  . Number of children: Not on file  . Years of education: Not on file  . Highest education level: Not on file  Occupational History  . Not on file  Tobacco Use  . Smoking status: Never Smoker  . Smokeless tobacco: Never Used  Substance and Sexual Activity  . Alcohol use: No  . Drug use: No  . Sexual activity: Yes    Birth control/protection: Post-menopausal  Other Topics Concern  . Not on file  Social History Narrative  . Not on file   Social Determinants of Health   Financial Resource Strain:   . Difficulty of Paying Living Expenses:   Food Insecurity:   . Worried About Charity fundraiser in the Last Year:   . Arboriculturist in the Last Year:   Transportation Needs:   . Film/video editor (Medical):   Marland Kitchen Lack of Transportation (Non-Medical):   Physical Activity:   . Days of Exercise per Week:   . Minutes of Exercise per Session:   Stress:   . Feeling of Stress :   Social Connections:   . Frequency of Communication with Friends and Family:   . Frequency of Social Gatherings with Friends and Family:   . Attends Religious Services:   . Active Member of Clubs or Organizations:   . Attends Archivist Meetings:   Marland Kitchen Marital Status:  Family History: Family History  Problem Relation Age of Onset  . Alzheimer's disease Mother   . COPD Mother   . Hypertension Mother   . Heart attack Mother   . Cancer Father        Lung  . Heart attack Sister   . Diabetes Sister   . Heart failure Sister   . Heart attack Brother   . Cancer Brother        Lung   Allergies: Allergies  Allergen Reactions  . Ciprofloxacin     seizure  . Oxybutynin     seizure  . Sulfa Antibiotics     seizures   Medications: See med rec.  Review of Systems: No fevers, chills, night sweats, weight loss, chest pain, or shortness of breath.   Objective:    General: Well Developed, well nourished,  and in no acute distress.  Neuro: Alert and oriented x3.  HEENT: Normocephalic, atraumatic.  Skin: Warm and dry. Cardiac: Regular rate and rhythm, no murmurs rubs or gallops, no lower extremity edema.  Respiratory: Clear to auscultation bilaterally. Not using accessory muscles, speaking in full sentences. Abdomen: Soft, nondistended.  Tenderness in the right upper quadrant and bilateral lower quadrants including suprapubic area.  Bowel sounds positive x4.  Bilateral CVA tenderness.  Impression and Recommendations:    Recurrent UTIs/pyelonephritis POCT UA positive for nitrites, moderate leukocytes, and trace blood.  Recent culture grew E. coli which was sensitive to cephalosporins.  Given recent sensitivities and patient's positive experiences in the past, treating with cephalexin 500 mg 3 times daily x5 days.  Will then change to cephalexin 250 mg daily for UTI prophylaxis while awaiting urology evaluation.  Do not start nitrofurantoin prophylaxis previously prescribed.  Return if symptoms worsen or fail to improve. ___________________________________________ Clearnce Sorrel, DNP, APRN, FNP-BC [facility]

## 2019-07-18 NOTE — Telephone Encounter (Signed)
No routine refills for antibiotics, needs a visit to recollect urine specimen of concern for UTI.

## 2019-07-20 LAB — URINE CULTURE
MICRO NUMBER:: 10373561
SPECIMEN QUALITY:: ADEQUATE

## 2019-09-19 IMAGING — DX DG LUMBAR SPINE COMPLETE 4+V
5 series · 5 of 5 positions shown · non-contrast
Comparison: CT abdomen pelvis - 09/04/2016

CLINICAL DATA: Fall 1 week ago with back pain and numbness
radiating to the legs and toes.

EXAM:
LUMBAR SPINE - COMPLETE 4+ VIEW

[l-spine ap]
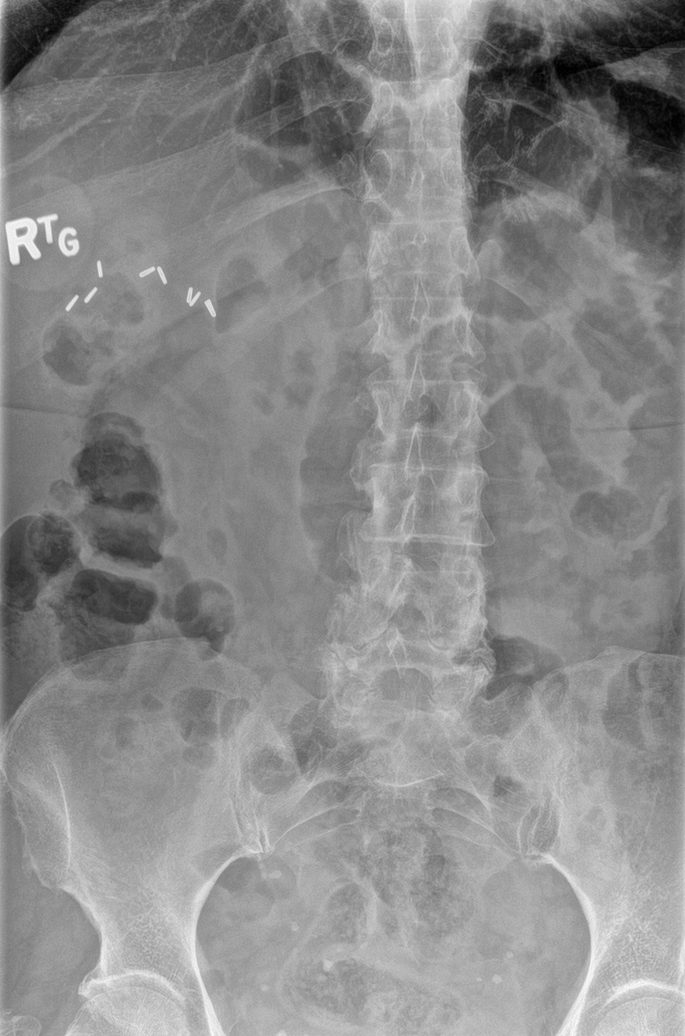

[l-spine obl (1 of 2)]
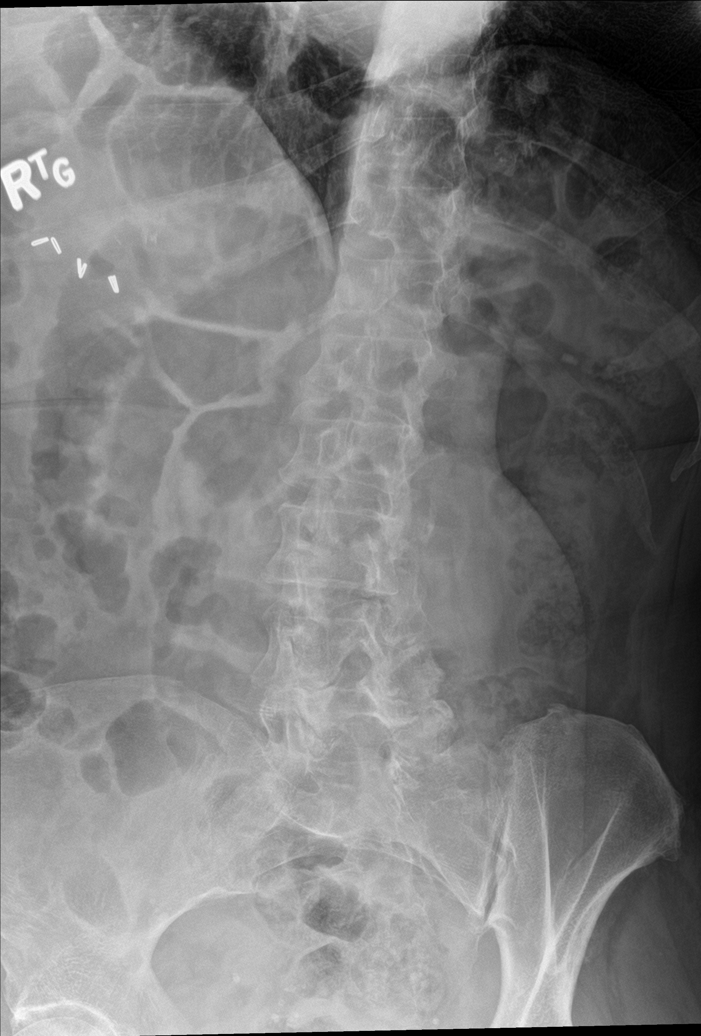

[l-spine obl (2 of 2)]
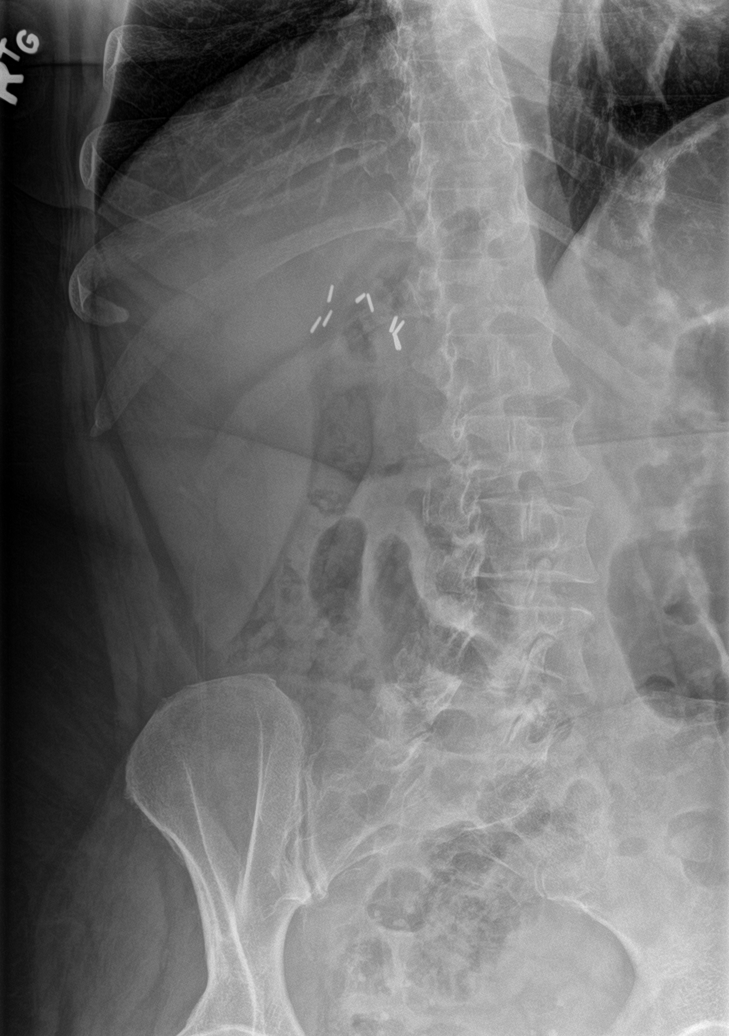

[l-spine lat]
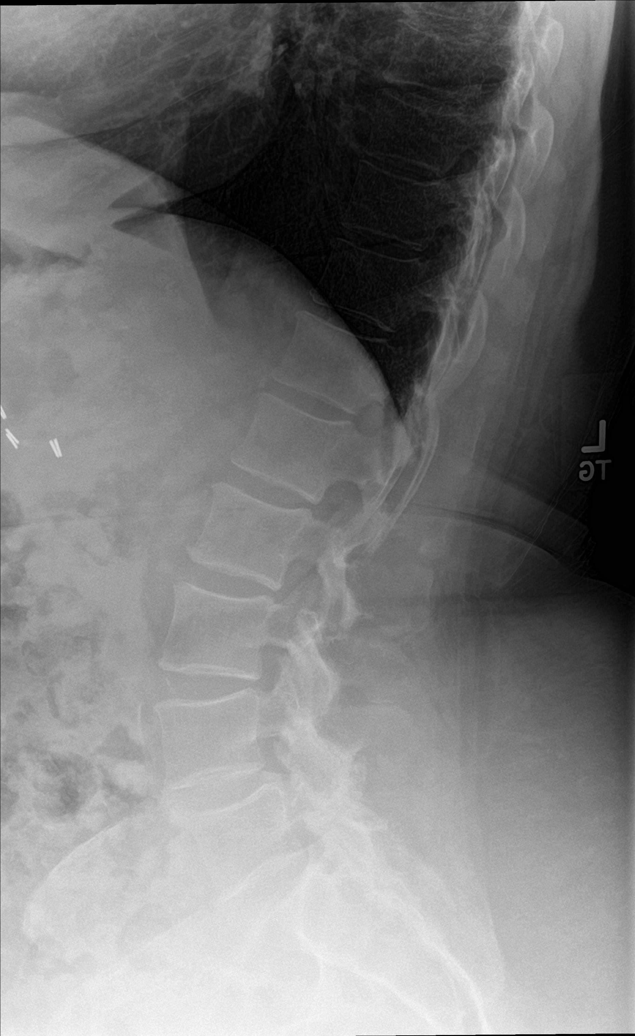

[l-spine spot]
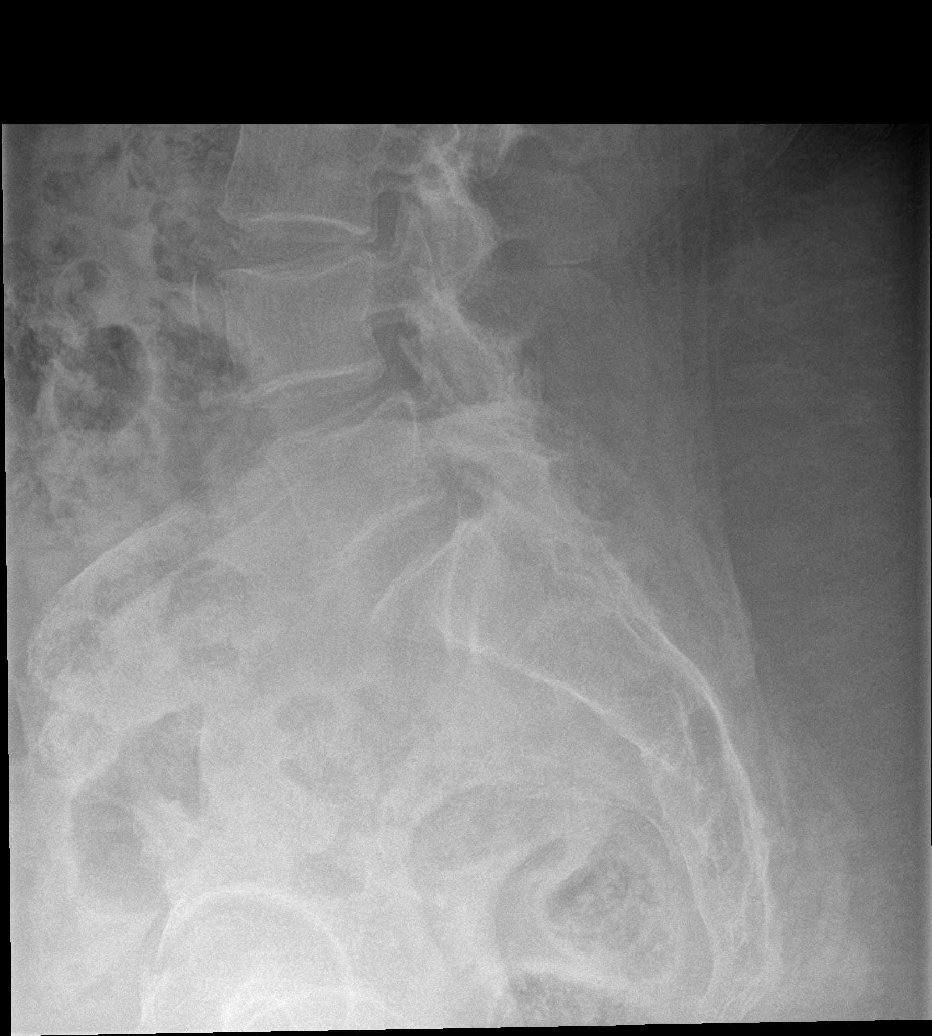

[5 of 5 positions shown; findings below may reference images not displayed]

FINDINGS: There are 5 non rib-bearing lumbar type vertebral bodies.

Normal alignment of lumbar spine. No anterolisthesis or
retrolisthesis. No definite pars defects.

Lumbar vertebral body heights appear preserved.

Mild multilevel lumbar spine DDD, likely worse at L2-L3 with disc
space height loss, endplate irregularity and sclerosis.

Moderate to severe facet degenerative change within the lower lumbar
spine, worse at L4-L5 and L5-S1.

Limited visualization of the bilateral SI joints is normal.

Calcified atherosclerotic plaque within the abdominal aorta. Post
cholecystectomy. Multiple phleboliths overlie the lower pelvis
bilaterally. Nonobstructive bowel gas pattern.
IMPRESSION: 1. No acute findings.
2. Mild multilevel lumbar spine DDD, likely worse at L2-L3.
3. Moderate to severe bilateral facet degenerative change within the
lower lumbar spine.

## 2019-10-19 IMAGING — DX DG KNEE COMPLETE 4+V*R*
4 series · 4 of 4 positions shown · non-contrast
Comparison: None.

CLINICAL DATA: Right knee pain for the past 6 weeks following fall.
History of right knee surgery.

EXAM:
RIGHT KNEE - COMPLETE 4+ VIEW

[knee ap]
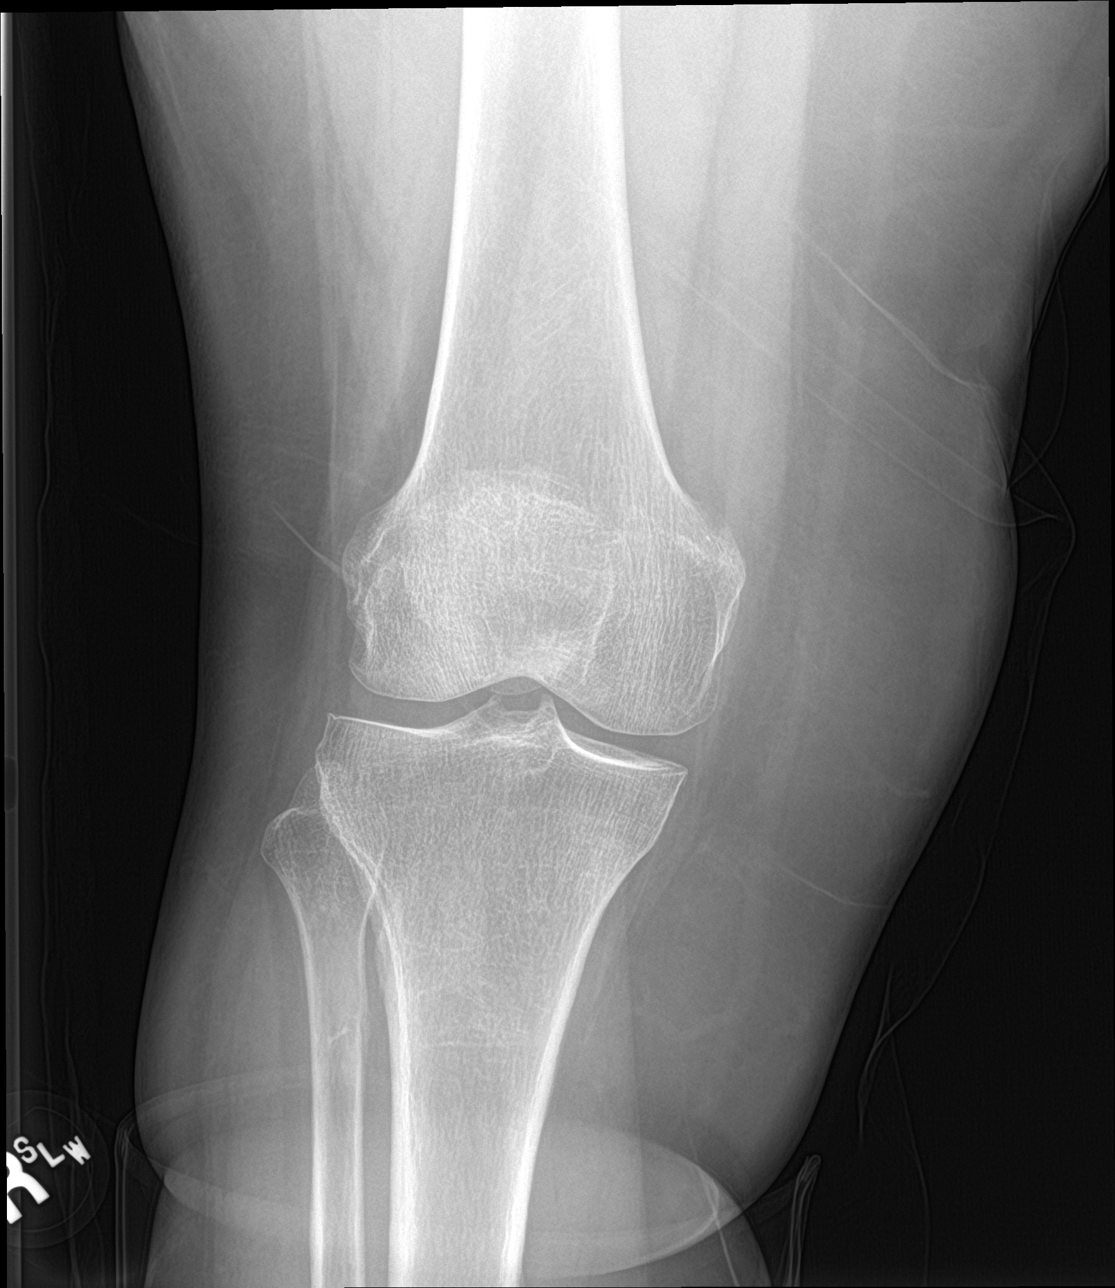

[knee lat]
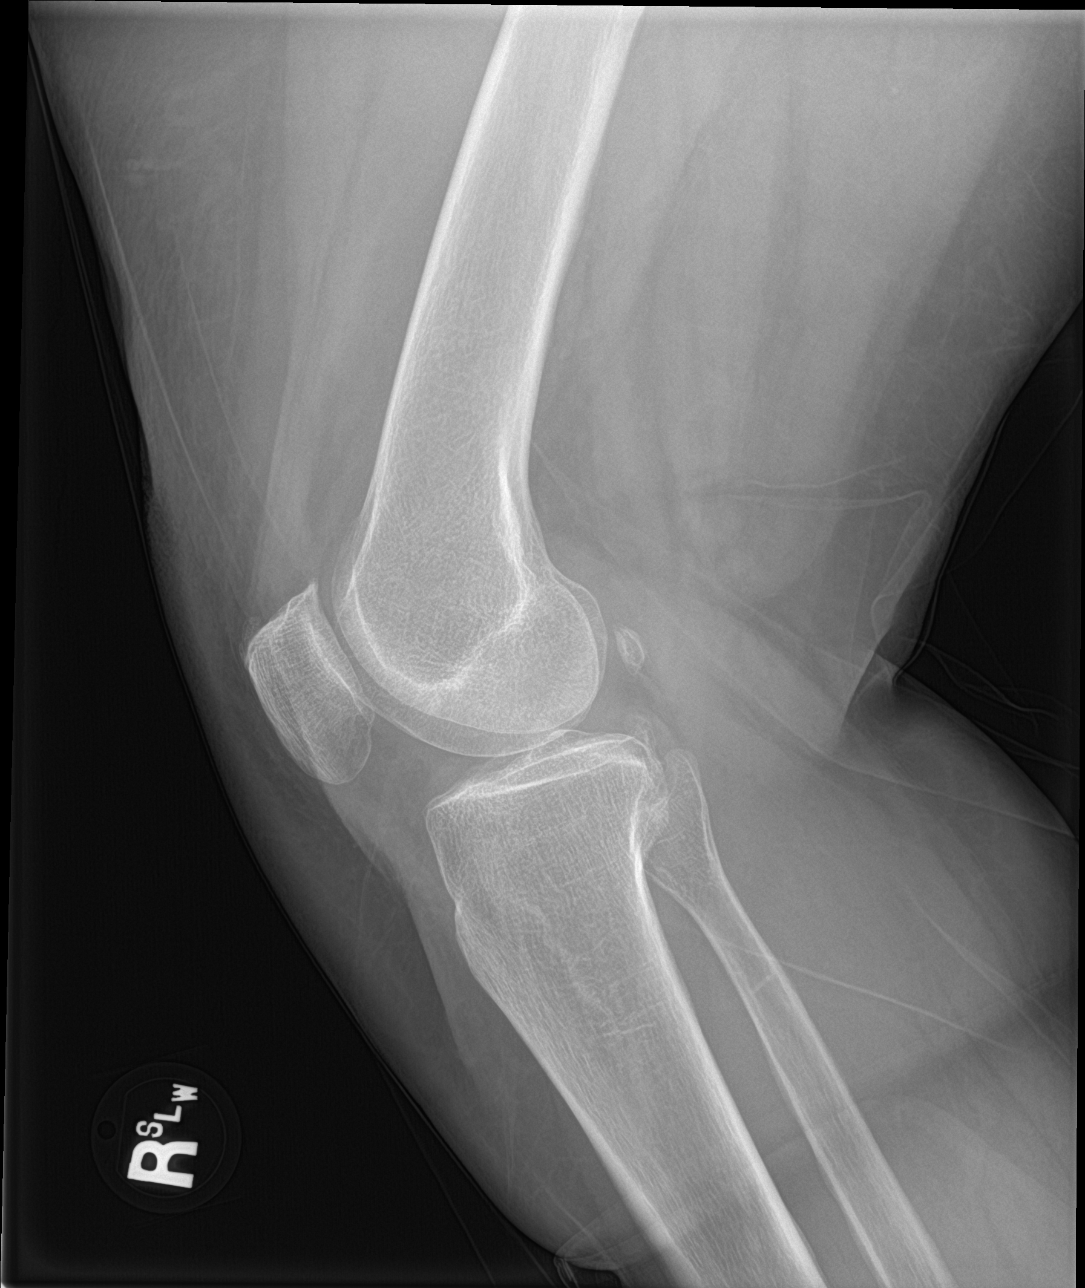

[knee obl (1 of 2)]
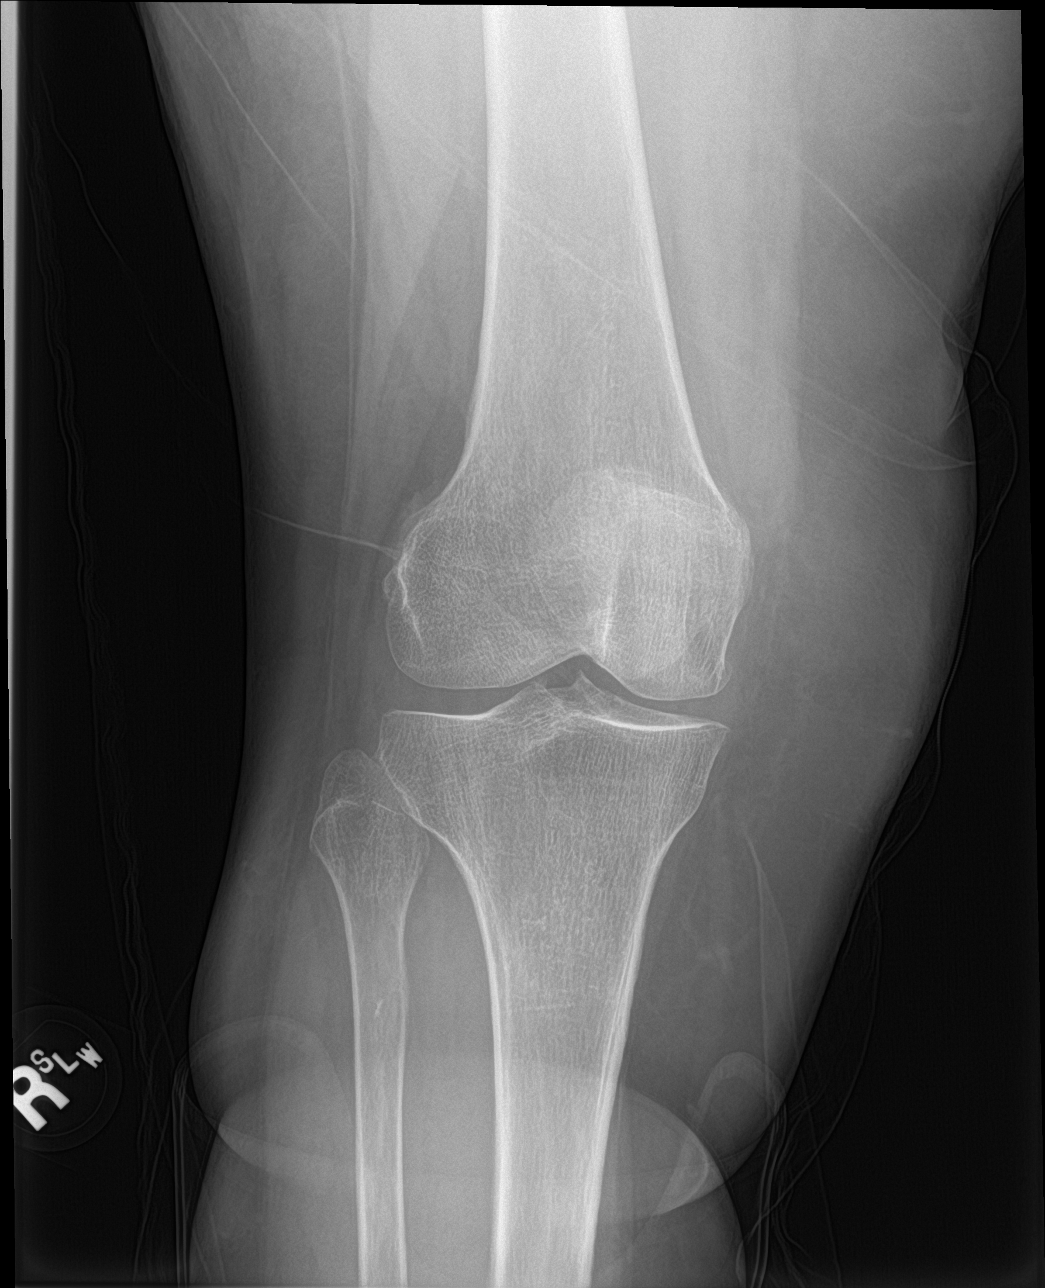

[knee obl (2 of 2)]
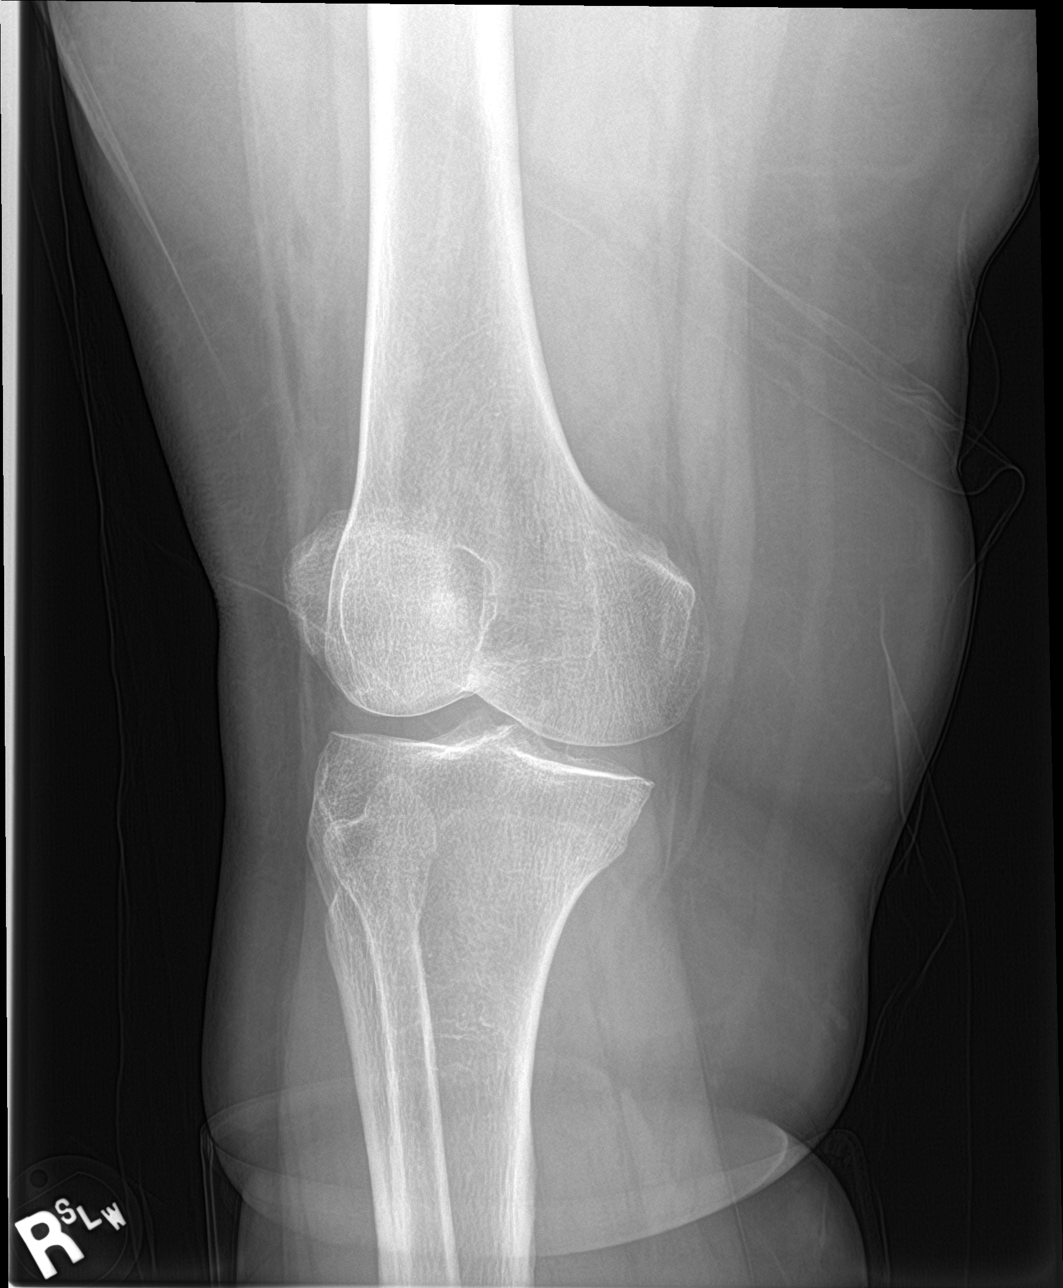

[4 of 4 positions shown; findings below may reference images not displayed]

FINDINGS: No fracture or dislocation. Suspected tiny knee joint effusion. Mild
tricompartmental degenerative change of the knee, worse within the
medial compartment and patellofemoral joints with joint space loss,
subchondral sclerosis and osteophytosis. No evidence of
chondrocalcinosis. There is minimal spurring the tibial spines. No
radiopaque foreign body.
IMPRESSION: 1. Tiny knee joint effusion.  Otherwise, no acute findings.
2. Mild tricompartmental degenerative change of the knee.

## 2019-11-04 ENCOUNTER — Other Ambulatory Visit: Payer: Self-pay | Admitting: Urology

## 2019-11-17 NOTE — Patient Instructions (Addendum)
DUE TO COVID-19 ONLY ONE VISITOR IS ALLOWED TO COME WITH YOU AND STAY IN THE WAITING ROOM ONLY DURING PRE OP AND PROCEDURE DAY OF SURGERY. THE 1 VISITOR  MAY VISIT WITH YOU AFTER SURGERY IN YOUR PRIVATE ROOM DURING VISITING HOURS ONLY!  YOU NEED TO HAVE A COVID 19 TEST ON: 11/21/19@ 1:00 pm, THIS TEST MUST BE DONE BEFORE SURGERY,  COVID TESTING SITE [place] [address], IT IS ON THE RIGHT GOING OUT [address] APPROXIMATELY  2 MINUTES PAST [employer] ON THE RIGHT. ONCE YOUR COVID TEST IS COMPLETED,  PLEASE BEGIN THE QUARANTINE INSTRUCTIONS AS OUTLINED IN YOUR HANDOUT.                [name]    Your procedure is scheduled on: 11/25/19   Report to [facility] Main  Entrance   Report to admitting at: 7:00 AM     Call this number if you have problems the morning of surgery [phone]    Remember: Do not eat food or drink liquids :After Midnight.    BRUSH YOUR TEETH MORNING OF SURGERY AND RINSE YOUR MOUTH OUT, NO CHEWING GUM CANDY OR MINTS.     Take these medicines the morning of surgery with A SIP OF WATER: sertraline.                                You may not have any metal on your body including hair pins and              piercings  Do not wear jewelry, make-up, lotions, powders or perfumes, deodorant             Do not wear nail polish on your fingernails.  Do not shave  48 hours prior to surgery.              Do not bring valuables to the hospital. [place].  Contacts, dentures or bridgework may not be worn into surgery.  Leave suitcase in the car. After surgery it may be brought to your room.     Patients discharged the day of surgery will not be allowed to drive home. IF YOU ARE HAVING SURGERY AND GOING HOME THE SAME DAY, YOU MUST HAVE AN ADULT TO DRIVE YOU HOME AND BE WITH YOU FOR 24 HOURS. YOU MAY GO HOME BY TAXI OR UBER OR ORTHERWISE, BUT AN ADULT MUST ACCOMPANY YOU HOME  AND STAY WITH YOU FOR 24 HOURS.  Name and phone number of your driver:  Special Instructions: N/A              Please read over the following fact sheets you were given: _____________________________________________________________________          [facility] - Preparing for Surgery Before surgery, you can play an important role.  Because skin is not sterile, your skin needs to be as free of germs as possible.  You can reduce the number of germs on your skin by washing with CHG (chlorahexidine gluconate) soap before surgery.  CHG is an antiseptic cleaner which kills germs and bonds with the skin to continue killing germs even after washing. Please DO NOT use if you have an allergy to CHG or antibacterial soaps.  If your skin becomes reddened/irritated stop using the CHG  and inform your nurse when you arrive at Short Stay. Do not shave (including legs and underarms) for at least 48 hours prior to the first CHG shower.  You may shave your face/neck. Please follow these instructions carefully:  1.  Shower with CHG Soap the night before surgery and the  morning of Surgery.  2.  If you choose to wash your hair, wash your hair first as usual with your  normal  shampoo.  3.  After you shampoo, rinse your hair and body thoroughly to remove the  shampoo.                           4.  Use CHG as you would any other liquid soap.  You can apply chg directly  to the skin and wash                       Gently with a scrungie or clean washcloth.  5.  Apply the CHG Soap to your body ONLY FROM THE NECK DOWN.   Do not use on face/ open                           Wound or open sores. Avoid contact with eyes, ears mouth and genitals (private parts).                       Wash face,  Genitals (private parts) with your normal soap.             6.  Wash thoroughly, paying special attention to the area where your surgery  will be performed.  7.  Thoroughly rinse your body with warm water from the neck down.  8.  DO NOT  shower/wash with your normal soap after using and rinsing off  the CHG Soap.                9.  Pat yourself dry with a clean towel.            10.  Wear clean pajamas.            11.  Place clean sheets on your bed the night of your first shower and do not  sleep with pets. Day of Surgery : Do not apply any lotions/deodorants the morning of surgery.  Please wear clean clothes to the hospital/surgery center.  FAILURE TO FOLLOW THESE INSTRUCTIONS MAY RESULT IN THE CANCELLATION OF YOUR SURGERY PATIENT SIGNATURE_________________________________  NURSE SIGNATURE__________________________________  ________________________________________________________________________

## 2019-11-18 ENCOUNTER — Encounter (HOSPITAL_COMMUNITY): Payer: Self-pay

## 2019-11-18 ENCOUNTER — Encounter (HOSPITAL_COMMUNITY)
Admission: RE | Admit: 2019-11-18 | Discharge: 2019-11-18 | Disposition: A | Discharge status: Home / Self Care | Payer: Medicare HMO | Source: Ambulatory Visit | Attending: Anesthesiology | Admitting: Anesthesiology

## 2019-11-18 ENCOUNTER — Other Ambulatory Visit: Payer: Self-pay

## 2019-11-18 DIAGNOSIS — Z01812 Encounter for preprocedural laboratory examination: Secondary | ICD-10-CM | POA: Diagnosis present

## 2019-11-18 HISTORY — DX: Chronic kidney disease, unspecified: N18.9

## 2019-11-18 NOTE — Progress Notes (Signed)
COVID Vaccine Completed: NO Date COVID Vaccine completed: COVID vaccine manufacturer: [employer]   PCP - Dr. Emeterio Reeve. LOV: 07/18/19  Cardiologist - NO  Chest x-ray -  EKG -  Stress Test -  ECHO -  Cardiac Cath -   Sleep Study - yes CPAP - No  Fasting Blood Sugar -  Checks Blood Sugar _____ times a day  Blood Thinner Instructions: Aspirin Instructions: Last Dose:  Anesthesia review:   Patient denies shortness of breath, fever, cough and chest pain at PAT appointment   Patient verbalized understanding of instructions that were given to them at the PAT appointment. Patient was also instructed that they will need to review over the PAT instructions again at home before surgery.

## 2019-11-19 ENCOUNTER — Encounter (HOSPITAL_COMMUNITY)
Admission: RE | Admit: 2019-11-19 | Discharge: 2019-11-19 | Disposition: A | Discharge status: Home / Self Care | Payer: Medicare HMO | Source: Ambulatory Visit | Attending: Urology | Admitting: Urology

## 2019-11-19 DIAGNOSIS — Z01812 Encounter for preprocedural laboratory examination: Secondary | ICD-10-CM | POA: Insufficient documentation

## 2019-11-19 LAB — CBC
HCT: 30 % — ABNORMAL LOW (ref 36.0–46.0)
Hemoglobin: 8.9 g/dL — ABNORMAL LOW (ref 12.0–15.0)
MCH: 22.4 pg — ABNORMAL LOW (ref 26.0–34.0)
MCHC: 29.7 g/dL — ABNORMAL LOW (ref 30.0–36.0)
MCV: 75.6 fL — ABNORMAL LOW (ref 80.0–100.0)
Platelets: 384 10*3/uL (ref 150–400)
RBC: 3.97 MIL/uL (ref 3.87–5.11)
RDW: 17.7 % — ABNORMAL HIGH (ref 11.5–15.5)
WBC: 9.1 10*3/uL (ref 4.0–10.5)
nRBC: 0 % (ref 0.0–0.2)

## 2019-11-19 NOTE — Progress Notes (Signed)
Lab results: HGB: 8.9 HCT: 30.

## 2019-11-21 ENCOUNTER — Other Ambulatory Visit (HOSPITAL_COMMUNITY)
Admission: RE | Admit: 2019-11-21 | Discharge: 2019-11-21 | Disposition: A | Discharge status: Home / Self Care | Payer: Medicare HMO | Source: Ambulatory Visit | Attending: Urology | Admitting: Urology

## 2019-11-21 DIAGNOSIS — Z01812 Encounter for preprocedural laboratory examination: Secondary | ICD-10-CM | POA: Diagnosis present

## 2019-11-21 DIAGNOSIS — Z20822 Contact with and (suspected) exposure to covid-19: Secondary | ICD-10-CM | POA: Insufficient documentation

## 2019-11-21 LAB — SARS CORONAVIRUS 2 (TAT 6-24 HRS): SARS Coronavirus 2: NEGATIVE

## 2019-11-25 ENCOUNTER — Ambulatory Visit (HOSPITAL_COMMUNITY)
Admission: RE | Admit: 2019-11-25 | Discharge: 2019-11-25 | Disposition: A | Discharge status: Home / Self Care | Payer: Medicare HMO | Attending: Urology | Admitting: Urology

## 2019-11-25 ENCOUNTER — Ambulatory Visit (HOSPITAL_COMMUNITY): Payer: Medicare HMO | Admitting: Certified Registered"

## 2019-11-25 ENCOUNTER — Encounter (HOSPITAL_COMMUNITY)
Admission: RE | Disposition: A | Discharge status: Home / Self Care | Payer: Self-pay | Source: Home / Self Care | Attending: Urology

## 2019-11-25 ENCOUNTER — Other Ambulatory Visit: Payer: Self-pay

## 2019-11-25 ENCOUNTER — Ambulatory Visit (HOSPITAL_COMMUNITY): Payer: Medicare HMO

## 2019-11-25 ENCOUNTER — Encounter (HOSPITAL_COMMUNITY): Payer: Self-pay | Admitting: Urology

## 2019-11-25 DIAGNOSIS — N3020 Other chronic cystitis without hematuria: Secondary | ICD-10-CM | POA: Insufficient documentation

## 2019-11-25 DIAGNOSIS — Z9884 Bariatric surgery status: Secondary | ICD-10-CM | POA: Diagnosis not present

## 2019-11-25 DIAGNOSIS — N189 Chronic kidney disease, unspecified: Secondary | ICD-10-CM | POA: Diagnosis not present

## 2019-11-25 DIAGNOSIS — G4730 Sleep apnea, unspecified: Secondary | ICD-10-CM | POA: Insufficient documentation

## 2019-11-25 HISTORY — PX: CYSTOSCOPY WITH URETEROSCOPY: SHX5123

## 2019-11-25 SURGERY — CYSTOSCOPY WITH URETEROSCOPY
Anesthesia: Choice

## 2019-11-25 MED ORDER — LIDOCAINE 2% (20 MG/ML) 5 ML SYRINGE
INTRAMUSCULAR | Status: DC | PRN
  Administered 2019-11-25: 50 mg via INTRAVENOUS

## 2019-11-25 MED ORDER — PROPOFOL 10 MG/ML IV BOLUS
INTRAVENOUS | Status: DC | PRN
  Administered 2019-11-25: 150 mg via INTRAVENOUS

## 2019-11-25 MED ORDER — IOHEXOL 300 MG/ML  SOLN
INTRAMUSCULAR | Status: DC | PRN
  Administered 2019-11-25: 30 mL

## 2019-11-25 MED ORDER — EPHEDRINE SULFATE-NACL 50-0.9 MG/10ML-% IV SOSY
PREFILLED_SYRINGE | INTRAVENOUS | Status: DC | PRN
  Administered 2019-11-25 (×2): 10 mg via INTRAVENOUS

## 2019-11-25 MED ORDER — FENTANYL CITRATE (PF) 100 MCG/2ML IJ SOLN
INTRAMUSCULAR | Status: DC | PRN
Start: 2019-11-25 — End: 2019-11-25
  Administered 2019-11-25: 25 ug via INTRAVENOUS

## 2019-11-25 MED ORDER — LIDOCAINE 2% (20 MG/ML) 5 ML SYRINGE
INTRAMUSCULAR | Status: AC
  Filled 2019-11-25: qty 5

## 2019-11-25 MED ORDER — LACTATED RINGERS IV SOLN: INTRAVENOUS | Status: DC

## 2019-11-25 MED ORDER — SODIUM CHLORIDE 0.9 % IR SOLN
Status: DC | PRN
  Administered 2019-11-25: 6000 mL

## 2019-11-25 MED ORDER — DEXAMETHASONE SODIUM PHOSPHATE 10 MG/ML IJ SOLN
INTRAMUSCULAR | Status: DC | PRN
  Administered 2019-11-25: 4 mg via INTRAVENOUS

## 2019-11-25 MED ORDER — FENTANYL CITRATE (PF) 100 MCG/2ML IJ SOLN: 25.0000 ug | INTRAMUSCULAR | Status: DC | PRN

## 2019-11-25 MED ORDER — TRAMADOL HCL 50 MG PO TABS
50.0000 mg | ORAL_TABLET | Freq: Four times a day (QID) | ORAL | 0 refills | Status: DC | PRN
Start: 2019-11-25 — End: 2019-11-25

## 2019-11-25 MED ORDER — ONDANSETRON HCL 4 MG/2ML IJ SOLN
INTRAMUSCULAR | Status: AC
  Filled 2019-11-25: qty 2

## 2019-11-25 MED ORDER — DEXAMETHASONE SODIUM PHOSPHATE 10 MG/ML IJ SOLN
INTRAMUSCULAR | Status: AC
  Filled 2019-11-25: qty 1

## 2019-11-25 MED ORDER — EPHEDRINE 5 MG/ML INJ
INTRAVENOUS | Status: AC
  Filled 2019-11-25: qty 10

## 2019-11-25 MED ORDER — ONDANSETRON HCL 4 MG/2ML IJ SOLN
INTRAMUSCULAR | Status: DC | PRN
  Administered 2019-11-25: 4 mg via INTRAVENOUS

## 2019-11-25 MED ORDER — ORAL CARE MOUTH RINSE: 15.0000 mL | Freq: Once | OROMUCOSAL | Status: AC

## 2019-11-25 MED ORDER — OXYCODONE HCL 5 MG/5ML PO SOLN: 5.0000 mg | Freq: Once | ORAL | Status: DC | PRN

## 2019-11-25 MED ORDER — PHENAZOPYRIDINE HCL 200 MG PO TABS: 200.0000 mg | ORAL_TABLET | Freq: Three times a day (TID) | ORAL | 0 refills | Status: AC | PRN

## 2019-11-25 MED ORDER — OXYCODONE HCL 5 MG PO TABS: 5.0000 mg | ORAL_TABLET | Freq: Once | ORAL | Status: DC | PRN

## 2019-11-25 MED ORDER — FENTANYL CITRATE (PF) 100 MCG/2ML IJ SOLN
INTRAMUSCULAR | Status: AC
  Filled 2019-11-25: qty 2

## 2019-11-25 MED ORDER — ONDANSETRON HCL 4 MG/2ML IJ SOLN: 4.0000 mg | Freq: Once | INTRAMUSCULAR | Status: DC | PRN

## 2019-11-25 MED ORDER — TRAMADOL HCL 50 MG PO TABS
50.0000 mg | ORAL_TABLET | Freq: Four times a day (QID) | ORAL | 0 refills | Status: AC | PRN
Start: 2019-11-25 — End: 2019-11-28

## 2019-11-25 MED ORDER — CHLORHEXIDINE GLUCONATE 0.12 % MT SOLN
15.0000 mL | Freq: Once | OROMUCOSAL | Status: AC
  Administered 2019-11-25: 15 mL via OROMUCOSAL

## 2019-11-25 MED ORDER — CEFAZOLIN SODIUM-DEXTROSE 2-4 GM/100ML-% IV SOLN
2.0000 g | Freq: Once | INTRAVENOUS | Status: AC
  Administered 2019-11-25: 2 g via INTRAVENOUS

## 2019-11-25 MED ORDER — PHENAZOPYRIDINE HCL 200 MG PO TABS: 200.0000 mg | ORAL_TABLET | Freq: Three times a day (TID) | ORAL | 0 refills | Status: DC | PRN

## 2019-11-25 MED ORDER — PROPOFOL 10 MG/ML IV BOLUS
INTRAVENOUS | Status: AC
  Filled 2019-11-25: qty 20

## 2019-11-25 MED ORDER — CEFAZOLIN SODIUM-DEXTROSE 2-4 GM/100ML-% IV SOLN
INTRAVENOUS | Status: AC
  Filled 2019-11-25: qty 100

## 2019-11-25 MED ORDER — STERILE WATER FOR IRRIGATION IR SOLN
Status: DC | PRN
  Administered 2019-11-25: 250 mL

## 2019-11-25 SURGICAL SUPPLY — 22 items
BAG URO CATCHER STRL LF (MISCELLANEOUS) ×3 IMPLANT
BASKET ZERO TIP NITINOL 2.4FR (BASKET) IMPLANT
BSKT STON RTRVL ZERO TP 2.4FR (BASKET)
CATH URET 5FR 28IN OPEN ENDED (CATHETERS) ×3 IMPLANT
CLOTH BEACON ORANGE TIMEOUT ST (SAFETY) ×3 IMPLANT
EXTRACTOR STONE NITINOL NGAGE (UROLOGICAL SUPPLIES) IMPLANT
GLOVE BIOGEL M STRL SZ7.5 (GLOVE) ×3 IMPLANT
GOWN STRL REUS W/TWL XL LVL3 (GOWN DISPOSABLE) ×3 IMPLANT
GUIDEWIRE ANG ZIPWIRE 038X150 (WIRE) ×2 IMPLANT
GUIDEWIRE ZIPWRE .038 STRAIGHT (WIRE) ×3 IMPLANT
KIT TURNOVER KIT A (KITS) IMPLANT
LASER FIB FLEXIVA PULSE ID 365 (Laser) IMPLANT
MANIFOLD NEPTUNE II (INSTRUMENTS) ×3 IMPLANT
PACK CYSTO (CUSTOM PROCEDURE TRAY) ×3 IMPLANT
SHEATH URETERAL 12FRX35CM (MISCELLANEOUS) IMPLANT
STENT URET 6FRX26 CONTOUR (STENTS) IMPLANT
SYR 10ML ECCENTRIC (SYRINGE) ×2 IMPLANT
TRACTIP FLEXIVA PULS ID 200XHI (Laser) IMPLANT
TRACTIP FLEXIVA PULSE ID 200 (Laser)
TUBING CONNECTING 10 (TUBING) ×2 IMPLANT
TUBING CONNECTING 10' (TUBING) ×1
TUBING UROLOGY SET (TUBING) ×3 IMPLANT

## 2019-11-25 NOTE — Anesthesia Postprocedure Evaluation (Signed)
Anesthesia Post Note  Patient: Selena May  Procedure(s) Performed: DIAGNOSTIC URETEROSCOPY (N/A )     Patient location during evaluation: PACU Anesthesia Type: General Level of consciousness: awake and alert Pain management: pain level controlled Vital Signs Assessment: post-procedure vital signs reviewed and stable Respiratory status: spontaneous breathing, nonlabored ventilation, respiratory function stable and patient connected to nasal cannula oxygen Cardiovascular status: blood pressure returned to baseline and stable Postop Assessment: no apparent nausea or vomiting Anesthetic complications: no   No complications documented.  Last Vitals:  Vitals:   11/25/19 1015 11/25/19 1032  BP: (!) 143/62 (!) 154/72  Pulse: 80 70  Resp: 15 15  Temp:  36.5 C  SpO2: 95% 97%    Last Pain:  Vitals:   11/25/19 1032  TempSrc:   PainSc: 0-No pain                 Manhattan Mccuen COKER

## 2019-11-25 NOTE — H&P (Signed)
Urology Preoperative H&P   Chief Complaint: Chronic cystitis  History of Present Illness: Selena May is a 69 y.o. female with chronic cystitis.  She recently evaluated by Dr. 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  She was also noted to have Left > Right renal scarring/atrophy.  She notes issues with UTIs dating back to her childhood.She was recently started on a course of Augmentin due to recurrent dysuria and malodorous urine.  Her final urine culture is still pending.  Today, she notes slight improvement in her urinary symptoms since starting abx, but reports persistent right sided flank pain associated with nausea.  She denies fever/chills, vomiting or hematuria.   Past Medical History:  Diagnosis Date  . Anxiety   . Chronic kidney disease   . Impingement syndrome of right shoulder   . Iron deficiency anemia 12/15/2011  . Leukocytosis 12/15/2011  . Osteopenia determined by x-ray 06/27/2017   March 2019 T score -2.3  . Pyelonephritis   . 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 Sleep apnea    lost weight and does not use CPAP now  . 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 VUR (vesicoureteric reflux) 11/30/2011    Past Surgical History:  Procedure Laterality Date  . ABDOMINAL HYSTERECTOMY    . APPENDECTOMY    . CHOLECYSTECTOMY    . GASTRIC BYPASS    . KIDNEY SURGERY    . KNEE SURGERY     left x 2 right x 1  . SHOULDER ARTHROSCOPY WITH ROTATOR CUFF REPAIR Right 04/24/2019   Procedure: RIGHT SHOULDER ARTHROSCOPY WITH ROTATOR CUFF REPAIR;  Surgeon: Hiram Gash, MD;  Location: [facility];  Service: Orthopedics;  Laterality: Right;  . TONSILLECTOMY      Allergies:  Allergies  Allergen Reactions  . Ciprofloxacin     seizure  . Oxybutynin     seizure  . Sulfa Antibiotics     seizures    Family History  Problem Relation Age of Onset  .  Alzheimer's disease Mother   . COPD Mother   . Hypertension Mother   . Heart attack Mother   . Cancer Father        Lung  . Heart attack Sister   . Diabetes Sister   . Heart failure Sister   . Heart attack Brother   . Cancer Brother        Lung    Social History:  reports that she has never smoked. She has never used smokeless tobacco. She reports that she does not drink alcohol and does not use drugs.  ROS: A complete review of systems was performed.  All systems are negative except for pertinent findings as noted.  Physical Exam:  Vital signs in last 24 hours: Temp:  [98.1 F (36.7 C)] 98.1 F (36.7 C) (08/24 0710) Pulse Rate:  [64] 64 (08/24 0710) Resp:  [17] 17 (08/24 0710) BP: (157)/(76) 157/76 (08/24 0710) SpO2:  [100 %] 100 % (08/24 0710) Weight:  [85.3 kg] 85.3 kg (08/24 0701) Constitutional:  Alert and oriented, No acute distress Cardiovascular: Regular rate and rhythm, No JVD Respiratory: Normal respiratory effort, Lungs clear bilaterally GI: Abdomen is soft, nontender, nondistended, no abdominal masses GU: No CVA tenderness Lymphatic: No lymphadenopathy Neurologic: Grossly intact, no focal deficits Psychiatric: Normal mood and affect  Laboratory Data:  No results for input(s): WBC, HGB,  HCT, PLT in the last 72 hours.  No results for input(s): NA, K, CL, GLUCOSE, BUN, CALCIUM, CREATININE in the last 72 hours.  Invalid input(s): CO3   No results found for this or any previous visit (from the past 24 hour(s)). Recent Results (from the past 240 hour(s))  SARS CORONAVIRUS 2 (TAT 6-24 HRS) Nasopharyngeal Nasopharyngeal Swab     Status: None   Collection Time: 11/21/19  1:42 PM   Specimen: Nasopharyngeal Swab  Result Value Ref Range Status   SARS Coronavirus 2 NEGATIVE NEGATIVE Final    Comment: (NOTE) SARS-CoV-2 target nucleic acids are NOT DETECTED.  The SARS-CoV-2 RNA is generally detectable in upper and lower respiratory specimens during the acute  phase of infection. Negative results do not preclude SARS-CoV-2 infection, do not rule out co-infections with other pathogens, and should not be used as the sole basis for treatment or other patient management decisions. Negative results must be combined with clinical observations, patient history, and epidemiological information. The expected result is Negative.  Fact Sheet for Patients: SugarRoll.be  Fact Sheet for Healthcare Providers: https://www.woods-mathews.com/  This test is not yet approved or cleared by the [place] FDA and  has been authorized for detection and/or diagnosis of SARS-CoV-2 by FDA under an Emergency Use Authorization (EUA). This EUA will remain  in effect (meaning this test can be used) for the duration of the COVID-19 declaration under Se ction 564(b)(1) of the Act, 21 U.S.C. section 360bbb-3(b)(1), unless the authorization is terminated or revoked sooner.  Performed at [facility], [place] [address], [place], [place] [address]     Renal Function: No results for input(s): CREATININE in the last 168 hours. CrCl cannot be calculated (Patient's most recent lab result is older than the maximum 21 days allowed.).  Radiologic Imaging: CLINICAL DATA: Chronic cystitis.  EXAM: CT ABDOMEN AND PELVIS WITHOUT AND WITH CONTRAST  TECHNIQUE: Multidetector CT imaging of the abdomen and pelvis was performed following the standard protocol before and following the bolus administration of intravenous contrast.  CONTRAST: 125 cc of Omnipaque 300  COMPARISON: 09/04/2016  FINDINGS: Lower chest: No acute abnormality.  Hepatobiliary: Calcified granuloma identified within the liver. No suspicious liver abnormalities. Previous cholecystectomy. No biliary dilatation.  Pancreas: Unremarkable. No pancreatic ductal dilatation or surrounding inflammatory changes.  Spleen: Normal in size without focal  abnormality.  Adrenals/Urinary Tract: Normal appearance of the adrenal glands. Bilateral renal cortical scarring identified, left greater than right. No kidney stones identified bilaterally. 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 Simple appearing cyst arises from upper pole of the right kidney measuring 2 cm, image 27/8. Too small to characterize low-density structure is noted within the posterior cortex of the right mid kidney measuring 9 mm, image 40/8.  At the left UVJ there is a focal area of nodular thickening which measures 2.0 x 1.3 cm, image 80/607. Previously this measured 1.8 x 1.2 cm. This appears hyperdense on the precontrast images without significant postcontrast enhancement.  Within the right side of bladder there is a small hyperdensity in the expected location of the right UVJ measuring 4 mm, image 80/2. This may represent a tiny stone within a small ureterocele.  Stomach/Bowel: Postoperative changes involving from previous gastric bypass. No bowel wall thickening, inflammation or distension identified.  Vascular/Lymphatic: Mild aortic atherosclerosis. No aneurysm. No abdominopelvic adenopathy identified.  Reproductive: Uterus appears atrophic or surgically absent. No adnexal mass.  Other: No free fluid or fluid collections.  Musculoskeletal: No acute or significant osseous findings.  IMPRESSION: 1. Extensive, bilateral renal cortical scarring, left greater than right. 2. There is a small hyperdense nodule in the region of the right UVJ. New from previous exam. Favor calcification within the right UVJ with possible ureterocele. 3. Again seen is a small, nonenhancing hyperdense area of nodular enlargement in the region of the left UVJ. The appearance is similar to exam from 09/04/2016 and may represent benign abnormality perhaps related to prior surgery. 4. Status post cholecystectomy and gastric bypass. 5. Aortic Atherosclerosis  (ICD10-I70.0).   Electronically Signed By: Kerby Moors M.D. On: 10/03/2019   I independently reviewed the above imaging studies.  Assessment and Plan Selena May is a 69 y.o. 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  The risks, benefits and alternatives of cystoscopy with bilateral ureteroscopy, possible laser lithotripsy, possible biopsy and ureteral stent placement was discussed the patient.  Risks included, but are not limited to: bleeding, urinary tract infection, ureteral injury/avulsion, ureteral stricture formation, retained stone fragments, the possibility that multiple surgeries may be required to treat the stone(s), MI, stroke, PE and the inherent risks of general anesthesia.  The patient voices understanding and wishes to proceed.      Ellison Hughs, MD 11/25/2019, [place] AM  [facility] Pager: [phone]

## 2019-11-25 NOTE — Op Note (Signed)
Operative Note  Preoperative diagnosis:  1.  Chronic cystitis 2.  Partial duplication of right collecting system  Postoperative diagnosis: 1.  Chronic cystitis 2.  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  Cystoscopy with diagnostic right ureteroscopy and bladder biopsy 2.  Bilateral retrograde pyelograms with intraoperative interpretation fluoroscopic imaging  Surgeon: Ellison Hughs, MD  Assistants:  None  Anesthesia:  General  Complications:  None  EBL: Less than 5 mL  Specimens: 1.  Bladder biopsy  Drains/Catheters: 1.  None  Intraoperative findings:   1. Right retrograde pyelogram revealed a partial duplication (extending down to the level of the pelvic brim) of the right collecting system with uniform hydroureteronephrosis involving both moieties.  No other filling defects were identified. 2. Ectopic left ureteral insertion at the left posterior bladder wall 3. Left retrograde pyelogram revealed uniform, slight dilation of the left ureter and renal pelvis with no other filling defects. 4. There is a circumferential amount of mucosa surrounding the ectopic left ureteral orifice that did not exhibit any papillary features  Indication:  Selena May is a 69 y.o. female with a history of chronic cystitis dating back to her childhood.  She recently had a CT urogram that revealed a partial duplication of the right collecting system, suspicious lesions involving the distal aspects of both ureters and left > right renal cortical scarring.  She has been consented for the above procedures, voices understanding and wishes to proceed.  Description of procedure:  After informed consent was obtained, the patient was brought to the operating room  and general LMA anesthesia was administered. The patient was then placed in the dorsolithotomy position and prepped and draped in the usual sterile fashion. A timeout was performed. A 23 French rigid cystoscope was then inserted into the urethral meatus and advanced into the bladder under direct vision. A complete bladder survey revealed the findings listed above.  A 5 French ureteral catheter was then inserted into the right ureteral orifice and a retrograde pyelogram was obtained, with the findings listed above.  A Glidewire was then used to intubate the lumen of the ureteral catheter and was advanced up to the right renal pelvis, under fluoroscopic guidance.  The catheter was then removed, leaving the wire in place.  A semirigid ureteroscope was then advanced into the right ureteral orifice and into the distal aspects of the right ureter.  The partial duplication could be seen approximately 4 to 5 cm from the right ureteral orifice.  The ureteroscope was then advanced the lower pole moiety up to the level of the right UPJ, revealing no nephrolithiasis or intraluminal lesion.  The upper pole moiety was then investigated in a similar fashion, again, finding no abnormalities.  The semirigid ureteroscope was then exchanged for the 23 French rigid cystoscope.  A 5 French ureteral catheter was then inserted into the left ureteral orifice, which was located along the left posterior aspects of the bladder wall.  A retrograde pyelogram was obtained, with the findings listed above.  There was a circumscribed round of mucosa surrounding the left ureteral orifice that did not exhibit any papillary features.  A biopsy of this tissue was obtained using cold cup biopsy forceps.  The bladder biopsy was then sent for permanent section.  The area of biopsy was then extensively fulgurated until hemostasis  was achieved.  The patient's bladder was then drained.  She tolerated the procedure well and was transferred to the  postanesthesia in stable condition.  Plan: Given the findings from today's surgery, the patient would likely benefit from a VCUG to rule out vesicoureteral reflux as a source of her recurrent urinary tract infections and renal cortical scarring.  She could potentially benefit from ureteral reimplants if UTIs continue to be an ongoing issue.  CC: Therapist, music

## 2019-11-25 NOTE — Anesthesia Preprocedure Evaluation (Signed)
Anesthesia Evaluation  Patient identified by MRN, date of birth, ID band Patient awake    Reviewed: Allergy & Precautions, NPO status , Patient's Chart, lab work & pertinent test results  Airway Mallampati: II  TM Distance: >3 FB Neck ROM: Full    Dental  (+) Teeth Intact, Dental Advisory Given   Pulmonary    breath sounds clear to auscultation       Cardiovascular  Rhythm:Regular Rate:Normal     Neuro/Psych    GI/Hepatic   Endo/Other    Renal/GU      Musculoskeletal   Abdominal   Peds  Hematology   Anesthesia Other Findings   Reproductive/Obstetrics                             Anesthesia Physical Anesthesia Plan  ASA: II  Anesthesia Plan: General   Post-op Pain Management:    Induction: Intravenous  PONV Risk Score and Plan: Ondansetron and Dexamethasone  Airway Management Planned: LMA  Additional Equipment:   Intra-op Plan:   Post-operative Plan: Extubation in OR  Informed Consent: I have reviewed the patients History and Physical, chart, labs and discussed the procedure including the risks, benefits and alternatives for the proposed anesthesia with the patient or authorized representative who has indicated his/her understanding and acceptance.     Dental advisory given  Plan Discussed with: CRNA and Anesthesiologist  Anesthesia Plan Comments:         Anesthesia Quick Evaluation

## 2019-11-25 NOTE — Transfer of Care (Signed)
Immediate Anesthesia Transfer of Care Note  Patient: Selena May  Procedure(s) Performed: DIAGNOSTIC URETEROSCOPY (N/A )  Patient Location: PACU  Anesthesia Type:General  Level of Consciousness: awake  Airway & Oxygen Therapy: Patient Spontanous Breathing and Patient connected to face mask oxygen  Post-op Assessment: Report given to RN, Post -op Vital signs reviewed and stable and Patient moving all extremities X 4  Post vital signs: Reviewed and stable  Last Vitals:  Vitals Value Taken Time  BP    Temp    Pulse    Resp 14 11/25/19 0954  SpO2    Vitals shown include unvalidated device data.  Last Pain:  Vitals:   11/25/19 0710  TempSrc: Oral  PainSc: 0-No pain         Complications: No complications documented.

## 2019-11-25 NOTE — Anesthesia Procedure Notes (Signed)
Procedure Name: LMA Insertion Date/Time: 11/25/2019 9:01 AM Performed by: Niel Hummer, CRNA Pre-anesthesia Checklist: Patient identified, Emergency Drugs available, Suction available and Patient being monitored Patient Re-evaluated:Patient Re-evaluated prior to induction Oxygen Delivery Method: Circle system utilized Preoxygenation: Pre-oxygenation with 100% oxygen Induction Type: IV induction LMA: LMA inserted LMA Size: 4.0 Number of attempts: 1 Dental Injury: Teeth and Oropharynx as per pre-operative assessment

## 2019-11-26 ENCOUNTER — Encounter (HOSPITAL_COMMUNITY): Payer: Self-pay | Admitting: Urology

## 2019-11-26 LAB — SURGICAL PATHOLOGY

## 2019-12-03 ENCOUNTER — Other Ambulatory Visit: Payer: Self-pay | Admitting: Family Medicine

## 2019-12-03 DIAGNOSIS — N390 Urinary tract infection, site not specified: Secondary | ICD-10-CM

## 2020-03-15 ENCOUNTER — Other Ambulatory Visit: Payer: Self-pay | Admitting: Osteopathic Medicine

## 2020-06-09 DIAGNOSIS — N3946 Mixed incontinence: Secondary | ICD-10-CM | POA: Diagnosis not present

## 2020-06-09 DIAGNOSIS — N390 Urinary tract infection, site not specified: Secondary | ICD-10-CM | POA: Diagnosis not present

## 2020-06-09 DIAGNOSIS — B952 Enterococcus as the cause of diseases classified elsewhere: Secondary | ICD-10-CM | POA: Diagnosis not present

## 2020-06-09 DIAGNOSIS — B9620 Unspecified Escherichia coli [E. coli] as the cause of diseases classified elsewhere: Secondary | ICD-10-CM | POA: Diagnosis not present

## 2020-06-09 DIAGNOSIS — R8290 Unspecified abnormal findings in urine: Secondary | ICD-10-CM | POA: Diagnosis not present

## 2020-06-21 DIAGNOSIS — N390 Urinary tract infection, site not specified: Secondary | ICD-10-CM | POA: Diagnosis not present

## 2020-06-21 DIAGNOSIS — N393 Stress incontinence (female) (male): Secondary | ICD-10-CM | POA: Diagnosis not present

## 2020-06-21 DIAGNOSIS — N329 Bladder disorder, unspecified: Secondary | ICD-10-CM | POA: Diagnosis not present

## 2020-07-07 ENCOUNTER — Other Ambulatory Visit: Payer: Self-pay | Admitting: Osteopathic Medicine

## 2020-07-07 DIAGNOSIS — F339 Major depressive disorder, recurrent, unspecified: Secondary | ICD-10-CM

## 2020-07-13 DIAGNOSIS — Z20822 Contact with and (suspected) exposure to covid-19: Secondary | ICD-10-CM | POA: Diagnosis not present

## 2020-07-13 DIAGNOSIS — Z01812 Encounter for preprocedural laboratory examination: Secondary | ICD-10-CM | POA: Diagnosis not present

## 2020-07-13 DIAGNOSIS — N393 Stress incontinence (female) (male): Secondary | ICD-10-CM | POA: Diagnosis not present

## 2020-07-20 DIAGNOSIS — N393 Stress incontinence (female) (male): Secondary | ICD-10-CM | POA: Diagnosis not present

## 2020-07-20 DIAGNOSIS — I517 Cardiomegaly: Secondary | ICD-10-CM | POA: Diagnosis not present

## 2020-07-20 DIAGNOSIS — Z87440 Personal history of urinary (tract) infections: Secondary | ICD-10-CM | POA: Diagnosis not present

## 2020-07-28 DIAGNOSIS — N390 Urinary tract infection, site not specified: Secondary | ICD-10-CM | POA: Diagnosis not present

## 2020-07-28 DIAGNOSIS — B9620 Unspecified Escherichia coli [E. coli] as the cause of diseases classified elsewhere: Secondary | ICD-10-CM | POA: Diagnosis not present

## 2020-07-28 DIAGNOSIS — R8290 Unspecified abnormal findings in urine: Secondary | ICD-10-CM | POA: Diagnosis not present

## 2020-07-28 DIAGNOSIS — R3915 Urgency of urination: Secondary | ICD-10-CM | POA: Diagnosis not present

## 2020-07-28 DIAGNOSIS — N393 Stress incontinence (female) (male): Secondary | ICD-10-CM | POA: Diagnosis not present

## 2020-07-28 DIAGNOSIS — Z87898 Personal history of other specified conditions: Secondary | ICD-10-CM | POA: Diagnosis not present

## 2020-10-01 ENCOUNTER — Other Ambulatory Visit: Payer: Self-pay | Admitting: Medical-Surgical

## 2020-10-01 DIAGNOSIS — N390 Urinary tract infection, site not specified: Secondary | ICD-10-CM

## 2020-10-05 ENCOUNTER — Other Ambulatory Visit: Payer: Self-pay | Admitting: Osteopathic Medicine

## 2020-10-05 DIAGNOSIS — F339 Major depressive disorder, recurrent, unspecified: Secondary | ICD-10-CM

## 2020-10-06 ENCOUNTER — Telehealth: Payer: Self-pay | Admitting: General Practice

## 2020-10-06 ENCOUNTER — Ambulatory Visit: Payer: Medicare Other

## 2020-10-06 NOTE — Telephone Encounter (Signed)
Patient had an appointment for her medicare wellness visit at 3p.m. on 10/06/20. I called the patient to complete the appointment as it was scheduled for a telephone visit.  I called the patient three times and left three messages but was not successful in getting in touch with the patient.

## 2020-11-24 DIAGNOSIS — N390 Urinary tract infection, site not specified: Secondary | ICD-10-CM | POA: Diagnosis not present

## 2020-11-24 DIAGNOSIS — R3915 Urgency of urination: Secondary | ICD-10-CM | POA: Diagnosis not present

## 2020-12-10 NOTE — Progress Notes (Signed)
Acute Office Visit  Subjective:    Patient ID: Selena May, female    DOB: 1950/08/17, 70 y.o.   MRN: [id number]  Chief Complaint  Patient presents with   Anxiety    HPI Patient is in today for nausea, vomiting, diarrhea, anxiety.  Patient states that for the past 6 months or so she has been struggling with nausea, vomiting, diarrhea.  States she tends to have trouble at various times throughout the day, not always related to mealtimes.  She really feels like this is related to stress and anxiety.  States she just recently got married about 2 years ago and her husband has become very controlling of her.  She states this leads to a lot of arguments, stressful interactions, and feeling like "he is trying to control every little thing I do."  She states she does feel safe where she lives and is not in any danger.  She does report this is very stressful to her and gets her worked up and anxious causing the nausea with occasional vomiting, diarrhea.  Patient reports she has never had this happen in the past.  She has already had her gallbladder removed greater than 30 years ago.  States she has not had any recent changes to her medications or diet.  She has not noticed any blood in her vomit or diarrhea.  States she still has regular bowel movements every day, but tends to have diarrhea at least once per day but this can vary depending on how much she is eating.  She has not had any fevers, abdominal pain, chest pain, trouble breathing, rashes, weight changes.  Reports the nausea can come on randomly.  Sometimes this happens within a few minutes of eating, while other times it can be unrelated to meals at all.  It even happens at night occasionally.  States she has been taking Dramamine which has helped a little bit with the nausea but she would like something stronger as the Zofran has not helped either.  States she has been on Zoloft for many years and it seems to be working well with no side  effects however she does not know if her dose is high enough now that she has had these additional stressors for the past year or so.  No other significant GI history.  Repeat PHQ9 and GAD7 as below:   [place] Office Visit from 12/13/2020 in [place]  PHQ-9 Total Score 13      GAD 7 : Generalized Anxiety Score 12/13/2020 03/04/2018  Nervous, Anxious, on Edge 2 0  Control/stop worrying 2 0  Worry too much - different things 1 0  Trouble relaxing 3 0  Restless 1 0  Easily annoyed or irritable 3 0  Afraid - awful might happen 0 0  Total GAD 7 Score 12 0  Anxiety Difficulty Not difficult at all Not difficult at all       Past Medical History:  Diagnosis Date   Anxiety    Chronic kidney disease    Impingement syndrome of right shoulder    Iron deficiency anemia 12/15/2011   Leukocytosis 12/15/2011   Osteopenia determined by x-ray 06/27/2017   March 2019 T score -2.3   Pyelonephritis    Renal mass 11/30/2011   Sleep apnea    lost weight and does not use CPAP now   Thrombocytosis (? 2/2 Fe Deficiency) 12/15/2011   VUR (vesicoureteric reflux) 11/30/2011    Past Surgical  History:  Procedure Laterality Date   ABDOMINAL HYSTERECTOMY     APPENDECTOMY     CHOLECYSTECTOMY     CYSTOSCOPY WITH URETEROSCOPY N/A 11/25/2019   Procedure: DIAGNOSTIC URETEROSCOPY;  Surgeon: Ceasar Mons, MD;  Location: [facility];  Service: Urology;  Laterality: N/A;   GASTRIC BYPASS     KIDNEY SURGERY     KNEE SURGERY     left x 2 right x 1   SHOULDER ARTHROSCOPY WITH ROTATOR CUFF REPAIR Right 04/24/2019   Procedure: RIGHT SHOULDER ARTHROSCOPY WITH ROTATOR CUFF REPAIR;  Surgeon: Hiram Gash, MD;  Location: [facility];  Service: Orthopedics;  Laterality: Right;   TONSILLECTOMY      Family History  Problem Relation Age of Onset   Alzheimer's disease Mother    COPD Mother    Hypertension Mother    Heart attack Mother    Cancer Father         Lung   Heart attack Sister    Diabetes Sister    Heart failure Sister    Heart attack Brother    Cancer Brother        Lung    Social History   Socioeconomic History   Marital status: Married    Spouse name: Not on file   Number of children: Not on file   Years of education: Not on file   Highest education level: Not on file  Occupational History   Not on file  Tobacco Use   Smoking status: Never   Smokeless tobacco: Never  Vaping Use   Vaping Use: Never used  Substance and Sexual Activity   Alcohol use: No   Drug use: No   Sexual activity: Yes    Birth control/protection: Post-menopausal  Other Topics Concern   Not on file  Social History Narrative   Not on file   Social Determinants of Health   Financial Resource Strain: Not on file  Food Insecurity: Not on file  Transportation Needs: Not on file  Physical Activity: Not on file  Stress: Not on file  Social Connections: Not on file  Intimate Partner Violence: Not on file    Outpatient Medications Prior to Visit  Medication Sig Dispense Refill   BIOTIN PO Take 1 tablet by mouth daily.     Cephalexin 250 MG tablet Take 1 tablet daily. (Patient not taking: Reported on 11/07/2019) 30 tablet 3   Cephalexin 500 MG tablet Take 1 tablet ('500mg'$ ) three times daily for 5 days then reduce to 1 tablet once daily until urology evaluation. (Patient not taking: Reported on 11/07/2019) 15 tablet 0   diclofenac sodium (VOLTAREN) 1 % GEL Apply 4 g topically 4 (four) times daily. To affected joint. (Patient not taking: Reported on 11/07/2019) 100 g 11   ferrous sulfate 325 (65 FE) MG tablet Take 325 mg by mouth daily with breakfast.     gabapentin (NEURONTIN) 300 MG capsule Take 1 capsule (300 mg total) by mouth at bedtime. RESTLESS LEGS 90 capsule 3   IRON PO Take by mouth. (Patient not taking: Reported on 11/07/2019)     nitrofurantoin, macrocrystal-monohydrate, (MACROBID) 100 MG capsule Take 1 capsule (100 mg total) by mouth 2  (two) times daily. (Patient not taking: Reported on 11/07/2019) 14 capsule 0   traZODone (DESYREL) 50 MG tablet Take 1-2 tablets (50-100 mg total) by mouth at bedtime as needed for sleep. (Patient not taking: Reported on 11/07/2019) 90 tablet 3   ondansetron (ZOFRAN-ODT) 8 MG disintegrating tablet  TAKE 1 TABLET BY MOUTH EVERY 8 HOURS AS NEEDED FOR NAUSEA 20 tablet 3   sertraline (ZOLOFT) 50 MG tablet TAKE 1 TABLET BY MOUTH EVERY DAY. Needs appointment. 15 tablet 0   No facility-administered medications prior to visit.    Allergies  Allergen Reactions   Ciprofloxacin     seizure   Oxybutynin     seizure   Sulfa Antibiotics     seizures    Review of Systems All review of systems negative except what is listed in the HPI     Objective:    Physical Exam Vitals reviewed.  Constitutional:      Appearance: Normal appearance.  HENT:     Head: Normocephalic and atraumatic.  Cardiovascular:     Rate and Rhythm: Normal rate and regular rhythm.     Pulses: Normal pulses.     Heart sounds: Normal heart sounds.  Pulmonary:     Effort: Pulmonary effort is normal.     Breath sounds: Normal breath sounds.  Abdominal:     General: Bowel sounds are normal. There is no distension.     Palpations: Abdomen is soft. There is no mass.     Tenderness: There is no abdominal tenderness. There is no guarding or rebound.  Musculoskeletal:     Cervical back: Normal range of motion and neck supple.  Skin:    General: Skin is warm and dry.     Capillary Refill: Capillary refill takes less than 2 seconds.     Findings: No rash.  Neurological:     General: No focal deficit present.     Mental Status: She is alert and oriented to person, place, and time. Mental status is at baseline.  Psychiatric:        Mood and Affect: Mood normal.        Behavior: Behavior normal.        Thought Content: Thought content normal.        Judgment: Judgment normal.    BP (!) 152/78   Pulse 64   Temp 98.2 F (36.8  C)   Resp 17   Wt 187 lb 11.2 oz (85.1 kg)   SpO2 98%   BMI 34.89 kg/m  Wt Readings from Last 3 Encounters:  12/13/20 187 lb 11.2 oz (85.1 kg)  11/25/19 188 lb 1 oz (85.3 kg)  11/19/19 188 lb 1 oz (85.3 kg)    Health Maintenance Due  Topic Date Due   COVID-19 Vaccine (1) Never done   Hepatitis C Screening  Never done   Fecal DNA (Cologuard)  Never done   Zoster Vaccines- Shingrix (1 of 2) Never done   PNA vac Low Risk Adult (1 of 2 - PCV13) Never done   MAMMOGRAM  06/28/2019   INFLUENZA VACCINE  Never done    There are no preventive care reminders to display for this patient.   Lab Results  Component Value Date   TSH 3.58 04/18/2016   Lab Results  Component Value Date   WBC 9.1 11/19/2019   HGB 8.9 (L) 11/19/2019   HCT 30.0 (L) 11/19/2019   MCV 75.6 (L) 11/19/2019   PLT 384 11/19/2019   Lab Results  Component Value Date   NA 143 04/18/2016   K 3.8 04/18/2016   CO2 24 04/18/2016   GLUCOSE 42 (L) 04/18/2016   BUN 15 04/18/2016   CREATININE 0.69 04/18/2016   BILITOT 0.6 04/18/2016   ALKPHOS 110 04/18/2016   AST 17 04/18/2016  ALT 10 04/18/2016   PROT 6.7 04/18/2016   ALBUMIN 3.7 04/18/2016   CALCIUM 8.5 (L) 04/18/2016   Lab Results  Component Value Date   CHOL 166 04/18/2016   Lab Results  Component Value Date   HDL 63 04/18/2016   Lab Results  Component Value Date   LDLCALC 85 04/18/2016   Lab Results  Component Value Date   TRIG 92 04/18/2016   Lab Results  Component Value Date   CHOLHDL 2.6 04/18/2016   No results found for: HGBA1C     Assessment & Plan:   1. Depression, recurrent ([place]) 2. Anxiety For her anxiety/depression, will go ahead and increase her Zoloft by half a tablet to total 75 mg daily.  Would like to recheck this in about 4 to 6 weeks to see if she has noticed some improvement or if we need to go up again.  She has not had any side effects to this medication in the past.  Not interested in counseling at this time.   Denies suicidal/homicidal ideation.  - sertraline (ZOLOFT) 50 MG tablet; Take 1.5 tablets (75 mg total) by mouth daily. TAKE 1 TABLET BY MOUTH EVERY DAY. Needs appointment.  Dispense: 45 tablet; Refill: 3  3. Non-intractable vomiting with nausea, unspecified vomiting type 4. Diarrhea, unspecified type 5. Abdominal discomfort Adding as needed Phenergan for nausea as the Zofran has not been as effective.  Patient is convinced this is related to stress/anxiety, but I would like to go ahead and rule out other potential causes.  We will get CBC, CMP, lipase to check pancreas, kidney, liver, electrolytes, blood counts.  Encouraged her to keep a diary of her symptoms related to time of day, activity, p.o. intake.  Patient was educated on red flag signs and symptoms that would require further/urgent evaluation.  No alarm findings today.  Gallbladder has already been removed.  - promethazine (PHENERGAN) 25 MG tablet; Take 1 tablet (25 mg total) by mouth every 6 (six) hours as needed for nausea or vomiting.  Dispense: 30 tablet; Refill: 2 - Lipase - CBC - COMPLETE METABOLIC PANEL WITH GFR   6. Elevated blood pressure BP elevated in office today. Did not improve with recheck. States she does not check it regularly at home. Not on any antihypertensives. Encouraged her to start checking it occasionally at home. Would like her to come back in 1-2 weeks for BP check at nurse visit. Educated on significance of heart healthy diet and exercise.    Follow-up for BP check in 1-2 weeks; 6-week follow-up for anxiety/GI symptoms or sooner if needed.   [employer] Nails Olevia Bowens, DNP, FNP-C

## 2020-12-13 ENCOUNTER — Encounter: Payer: Self-pay | Admitting: Family Medicine

## 2020-12-13 ENCOUNTER — Ambulatory Visit (INDEPENDENT_AMBULATORY_CARE_PROVIDER_SITE_OTHER): Payer: Medicare Other | Admitting: Family Medicine

## 2020-12-13 ENCOUNTER — Other Ambulatory Visit: Payer: Self-pay

## 2020-12-13 VITALS — BP 152/78 | HR 64 | Temp 98.2°F | Resp 17 | Wt 187.7 lb

## 2020-12-13 DIAGNOSIS — R030 Elevated blood-pressure reading, without diagnosis of hypertension: Secondary | ICD-10-CM | POA: Diagnosis not present

## 2020-12-13 DIAGNOSIS — R109 Unspecified abdominal pain: Secondary | ICD-10-CM | POA: Diagnosis not present

## 2020-12-13 DIAGNOSIS — F419 Anxiety disorder, unspecified: Secondary | ICD-10-CM | POA: Diagnosis not present

## 2020-12-13 DIAGNOSIS — F339 Major depressive disorder, recurrent, unspecified: Secondary | ICD-10-CM

## 2020-12-13 DIAGNOSIS — R197 Diarrhea, unspecified: Secondary | ICD-10-CM

## 2020-12-13 DIAGNOSIS — R112 Nausea with vomiting, unspecified: Secondary | ICD-10-CM | POA: Diagnosis not present

## 2020-12-13 MED ORDER — PROMETHAZINE HCL 25 MG PO TABS: 25.0000 mg | ORAL_TABLET | Freq: Four times a day (QID) | ORAL | 2 refills | Status: DC | PRN

## 2020-12-13 MED ORDER — SERTRALINE HCL 50 MG PO TABS: 75.0000 mg | ORAL_TABLET | Freq: Every day | ORAL | 3 refills | Status: DC

## 2020-12-13 NOTE — Patient Instructions (Signed)
Labs today to rule out other potential causes - we will let you know results as they come in. Sending in phenergan as needed for nausea Zoloft (sertraline) prescription increased Follow-up in 6 weeks to reassess or sooner if your symptoms worsen

## 2020-12-13 NOTE — Telephone Encounter (Signed)
[employer] called for clarification on Sertraline. I have cleared it with Lovena Le and sent in updated prescription.

## 2020-12-14 LAB — COMPLETE METABOLIC PANEL WITH GFR
AG Ratio: 1.2 (calc) (ref 1.0–2.5)
ALT: 11 U/L (ref 6–29)
AST: 18 U/L (ref 10–35)
Albumin: 4.1 g/dL (ref 3.6–5.1)
Alkaline phosphatase (APISO): 150 U/L (ref 37–153)
BUN: 17 mg/dL (ref 7–25)
CO2: 26 mmol/L (ref 20–32)
Calcium: 9.3 mg/dL (ref 8.6–10.4)
Chloride: 109 mmol/L (ref 98–110)
Creat: 0.65 mg/dL (ref 0.50–1.05)
Globulin: 3.3 g/dL (calc) (ref 1.9–3.7)
Glucose, Bld: 91 mg/dL (ref 65–99)
Potassium: 4.6 mmol/L (ref 3.5–5.3)
Sodium: 143 mmol/L (ref 135–146)
Total Bilirubin: 0.8 mg/dL (ref 0.2–1.2)
Total Protein: 7.4 g/dL (ref 6.1–8.1)
eGFR: 95 mL/min/{1.73_m2} (ref >=60)

## 2020-12-14 LAB — CBC
HCT: 37.1 % (ref 35.0–45.0)
Hemoglobin: 11.4 g/dL — ABNORMAL LOW (ref 11.7–15.5)
MCH: 24.7 pg — ABNORMAL LOW (ref 27.0–33.0)
MCHC: 30.7 g/dL — ABNORMAL LOW (ref 32.0–36.0)
MCV: 80.3 fL (ref 80.0–100.0)
MPV: 11.8 fL (ref 7.5–12.5)
Platelets: 459 10*3/uL — ABNORMAL HIGH (ref 140–400)
RBC: 4.62 10*6/uL (ref 3.80–5.10)
RDW: 14.6 % (ref 11.0–15.0)
WBC: 8.7 10*3/uL (ref 3.8–10.8)

## 2020-12-14 LAB — LIPASE: Lipase: 22 U/L (ref 7–60)

## 2020-12-14 NOTE — Progress Notes (Signed)
Your labs are at your baseline. Continue with the new dose of Zoloft. Let us know if your symptoms worsen. Hope you are feeling better soon! We will see you at follow-up unless you need [place] before then -Lovena Le

## 2020-12-28 ENCOUNTER — Ambulatory Visit: Payer: Medicare Other

## 2021-01-24 ENCOUNTER — Ambulatory Visit (INDEPENDENT_AMBULATORY_CARE_PROVIDER_SITE_OTHER): Payer: Self-pay | Admitting: Medical-Surgical

## 2021-01-24 DIAGNOSIS — Z91199 Patient's noncompliance with other medical treatment and regimen due to unspecified reason: Secondary | ICD-10-CM

## 2021-01-24 NOTE — Progress Notes (Signed)
   Complete physical exam  Patient: Selena May   DOB: 01/21/1998   70 y.o. Female  MRN: [id number]  Subjective:    No chief complaint on file.   Selena May is a 70 y.o. female who presents today for a complete physical exam. She reports consuming a {diet types:17450} diet. {types:19826} She generally feels {DESC; WELL/FAIRLY WELL/POORLY:18703}. She reports sleeping {DESC; WELL/FAIRLY WELL/POORLY:18703}. She {does/does not:200015} have additional problems to discuss today.    Most recent fall risk assessment:    09/28/2021   10:42 AM  Fall Risk   Falls in the past year? 0  Number falls in past yr: 0  Injury with Fall? 0  Risk for fall due to : No Fall Risks  Follow up Falls evaluation completed     Most recent depression screenings:    09/28/2021   10:42 AM 08/19/2020   10:46 AM  PHQ 2/9 Scores  PHQ - 2 Score 0 0  PHQ- 9 Score 5     {VISON DENTAL STD PSA (Optional):27386}  {History (Optional):23778}  Patient Care Team: Chassity Ludke, NP as PCP - General (Nurse Practitioner)   Outpatient Medications Prior to Visit  Medication Sig   fluticasone (FLONASE) 50 MCG/ACT nasal spray Place 2 sprays into both nostrils in the morning and at bedtime. After 7 days, reduce to once daily.   norgestimate-ethinyl estradiol (SPRINTEC 28) 0.25-35 MG-MCG tablet Take 1 tablet by mouth daily.   Nystatin POWD Apply liberally to affected area 2 times per day   spironolactone (ALDACTONE) 100 MG tablet Take 1 tablet (100 mg total) by mouth daily.   No facility-administered medications prior to visit.    ROS        Objective:     There were no vitals taken for this visit. {Vitals History (Optional):23777}  Physical Exam   No results found for any visits on 11/03/21. {Show previous labs (optional):23779}    Assessment & Plan:    Routine Health Maintenance and Physical Exam  Immunization History  Administered Date(s) Administered   DTaP 04/06/1999, 06/02/1999,  08/11/1999, 04/26/2000, 11/10/2003   Hepatitis A 09/06/2007, 09/11/2008   Hepatitis B 01/22/1999, 03/01/1999, 08/11/1999   HiB (PRP-OMP) 04/06/1999, 06/02/1999, 08/11/1999, 04/26/2000   IPV 04/06/1999, 06/02/1999, 01/30/2000, 11/10/2003   Influenza,inj,Quad PF,6+ Mos 12/12/2013   Influenza-Unspecified 03/13/2012   MMR 01/29/2001, 11/10/2003   Meningococcal Polysaccharide 09/11/2011   Pneumococcal Conjugate-13 04/26/2000   Pneumococcal-Unspecified 08/11/1999, 10/25/1999   Tdap 09/11/2011   Varicella 01/30/2000, 09/06/2007    Health Maintenance  Topic Date Due   HIV Screening  Never done   Hepatitis C Screening  Never done   INFLUENZA VACCINE  11/01/2021   PAP-Cervical Cytology Screening  11/03/2021 (Originally 01/21/2020)   PAP SMEAR-Modifier  11/03/2021 (Originally 01/21/2020)   TETANUS/TDAP  11/03/2021 (Originally 09/10/2021)   HPV VACCINES  Discontinued   COVID-19 Vaccine  Discontinued    Discussed health benefits of physical activity, and encouraged her to engage in regular exercise appropriate for her age and condition.  Problem List Items Addressed This Visit   None Visit Diagnoses     Annual physical exam    -  Primary   Cervical cancer screening       Need for Tdap vaccination          No follow-ups on file.     Nadalie Laughner, NP   

## 2021-02-22 DIAGNOSIS — N390 Urinary tract infection, site not specified: Secondary | ICD-10-CM | POA: Diagnosis not present

## 2021-02-22 DIAGNOSIS — B9689 Other specified bacterial agents as the cause of diseases classified elsewhere: Secondary | ICD-10-CM | POA: Diagnosis not present

## 2021-02-25 DIAGNOSIS — N12 Tubulo-interstitial nephritis, not specified as acute or chronic: Secondary | ICD-10-CM | POA: Diagnosis not present

## 2021-02-25 DIAGNOSIS — Z87440 Personal history of urinary (tract) infections: Secondary | ICD-10-CM | POA: Diagnosis not present

## 2021-02-25 DIAGNOSIS — R072 Precordial pain: Secondary | ICD-10-CM | POA: Diagnosis not present

## 2021-02-25 DIAGNOSIS — I878 Other specified disorders of veins: Secondary | ICD-10-CM | POA: Diagnosis not present

## 2021-02-25 DIAGNOSIS — I491 Atrial premature depolarization: Secondary | ICD-10-CM | POA: Diagnosis not present

## 2021-02-25 DIAGNOSIS — N281 Cyst of kidney, acquired: Secondary | ICD-10-CM | POA: Diagnosis not present

## 2021-02-25 DIAGNOSIS — R300 Dysuria: Secondary | ICD-10-CM | POA: Diagnosis not present

## 2021-02-25 DIAGNOSIS — R109 Unspecified abdominal pain: Secondary | ICD-10-CM | POA: Diagnosis not present

## 2021-02-25 DIAGNOSIS — R079 Chest pain, unspecified: Secondary | ICD-10-CM | POA: Diagnosis not present

## 2021-02-25 DIAGNOSIS — I517 Cardiomegaly: Secondary | ICD-10-CM | POA: Diagnosis not present

## 2021-02-25 DIAGNOSIS — R350 Frequency of micturition: Secondary | ICD-10-CM | POA: Diagnosis not present

## 2021-02-26 DIAGNOSIS — I491 Atrial premature depolarization: Secondary | ICD-10-CM | POA: Diagnosis not present

## 2021-02-28 ENCOUNTER — Telehealth: Payer: Self-pay | Admitting: General Practice

## 2021-02-28 NOTE — Telephone Encounter (Signed)
Transition Care Management Unsuccessful Follow-up Telephone Call  Date of discharge and from where:  02/26/21 from [facility]  Attempts:  1st Attempt  Reason for unsuccessful TCM follow-up call:  Left voice message

## 2021-03-02 NOTE — Telephone Encounter (Signed)
Transition Care Management Unsuccessful Follow-up Telephone Call  Date of discharge and from where:  02/26/2021 from [facility]  Attempts:  2nd Attempt  Reason for unsuccessful TCM follow-up call:  Left voice message

## 2021-03-15 ENCOUNTER — Other Ambulatory Visit: Payer: Self-pay | Admitting: Osteopathic Medicine

## 2021-03-15 DIAGNOSIS — F339 Major depressive disorder, recurrent, unspecified: Secondary | ICD-10-CM

## 2021-03-15 DIAGNOSIS — F419 Anxiety disorder, unspecified: Secondary | ICD-10-CM

## 2021-04-26 DIAGNOSIS — L718 Other rosacea: Secondary | ICD-10-CM | POA: Diagnosis not present

## 2021-05-25 DIAGNOSIS — N390 Urinary tract infection, site not specified: Secondary | ICD-10-CM | POA: Diagnosis not present

## 2021-07-01 DIAGNOSIS — H25812 Combined forms of age-related cataract, left eye: Secondary | ICD-10-CM | POA: Diagnosis not present

## 2021-07-01 DIAGNOSIS — H353131 Nonexudative age-related macular degeneration, bilateral, early dry stage: Secondary | ICD-10-CM | POA: Diagnosis not present

## 2021-07-01 DIAGNOSIS — H2511 Age-related nuclear cataract, right eye: Secondary | ICD-10-CM | POA: Diagnosis not present

## 2021-07-01 DIAGNOSIS — Z01818 Encounter for other preprocedural examination: Secondary | ICD-10-CM | POA: Diagnosis not present

## 2021-07-06 DIAGNOSIS — R8290 Unspecified abnormal findings in urine: Secondary | ICD-10-CM | POA: Diagnosis not present

## 2021-07-06 DIAGNOSIS — N390 Urinary tract infection, site not specified: Secondary | ICD-10-CM | POA: Diagnosis not present

## 2021-07-14 DIAGNOSIS — H25812 Combined forms of age-related cataract, left eye: Secondary | ICD-10-CM | POA: Diagnosis not present

## 2021-08-18 DIAGNOSIS — H2513 Age-related nuclear cataract, bilateral: Secondary | ICD-10-CM | POA: Diagnosis not present

## 2021-08-18 DIAGNOSIS — H2511 Age-related nuclear cataract, right eye: Secondary | ICD-10-CM | POA: Diagnosis not present

## 2021-09-02 ENCOUNTER — Other Ambulatory Visit: Payer: Self-pay | Admitting: Osteopathic Medicine

## 2021-11-08 DIAGNOSIS — N390 Urinary tract infection, site not specified: Secondary | ICD-10-CM | POA: Diagnosis not present

## 2021-12-17 DIAGNOSIS — H04123 Dry eye syndrome of bilateral lacrimal glands: Secondary | ICD-10-CM | POA: Diagnosis not present

## 2021-12-19 DIAGNOSIS — H04123 Dry eye syndrome of bilateral lacrimal glands: Secondary | ICD-10-CM | POA: Diagnosis not present

## 2021-12-19 DIAGNOSIS — B958 Unspecified staphylococcus as the cause of diseases classified elsewhere: Secondary | ICD-10-CM | POA: Diagnosis not present

## 2021-12-19 DIAGNOSIS — H16043 Marginal corneal ulcer, bilateral: Secondary | ICD-10-CM | POA: Diagnosis not present

## 2021-12-27 DIAGNOSIS — H16043 Marginal corneal ulcer, bilateral: Secondary | ICD-10-CM | POA: Diagnosis not present

## 2022-01-31 ENCOUNTER — Encounter: Payer: Self-pay | Admitting: *Deleted

## 2022-01-31 ENCOUNTER — Telehealth: Payer: Self-pay | Admitting: *Deleted

## 2022-01-31 NOTE — Patient Instructions (Addendum)
Visit Information  Thank you for taking time to visit with me today. Please don't hesitate to contact me if I can be of assistance to you.   Following are the goals we discussed today:   Goals Addressed             This Visit's Progress    Care Coordination Activities: will follow up within 2 weeks   On track    Care Coordination Interventions: Evaluation of current treatment plan related to frequent UTI and patient's adherence to plan as established by provider Advised patient to consider going to [facility] to have the symptoms she reports today addressed-- she reports has been trying to make PCP office visit, needs call back from scheduling team: I placed a care coordination outreach to both the PCP practice and the practice manager to facilitate getting this appointment scheduled Provided education to patient re: signs/ symptoms UTI along with corresponding action plan; risks of taking "old"/ expired medication Reviewed medications with patient and discussed adherence: she reports good adherence; medication list updated according to patient report today Collaborated with PCP scheduling team and practice manager to facilitate scheduling of PCP appointment regarding frequent UTI Reviewed scheduled/upcoming provider appointments including none Advised patient to discuss frequent UTI symptoms/ plan of care with provider Screening for signs and symptoms of depression related to chronic disease state  Assessed social determinant of health barriers Confirmed patient does not "ever" obtain flu vaccines; encouraged her to practice standard infection control practices to prevent illness during upcoming flu/ winter season          If you are experiencing a [place] or [place] or need someone to talk to, please  call the Suicide and Crisis Lifeline: 988 call the [place] National Suicide Prevention Lifeline: 630-217-0471 or TTY: (216) 483-2482 TTY 905-502-5308) to talk to a  trained counselor call 1-800-273-TALK (toll free, 24 hour hotline) go to [facility] [address], [place] [phone]) call the [place]: [phone] call 911   Patient verbalizes understanding of instructions and care plan provided today and agrees to view in [place]. Active MyChart status and patient understanding of how to access instructions and care plan via MyChart confirmed with patient.     No further follow up required:  confirmed prior to signing of chart that PCP office visit has been scheduled   Oneta Rack, RN, BSN, CCRN Alumnus RN CM Care Coordination/ Transition of [place] [facility] [phone]: direct office

## 2022-01-31 NOTE — Patient Outreach (Addendum)
Care Coordination   Initial Visit Note   01/31/2022 Name: Selena May MRN: [id number] DOB: Aug 07, 1950  Selena May is a 71 y.o. year old female who sees Selena Reeve, DO for primary care. I spoke with  Selena May by phone today.  What matters to the patients health and wellness today?  "I am not doing well at all-- I keep having these ongoing UTI's and have been trying to schedule an appointment with my PCP-- I have called multiple times to schedule an appointment-- I always have to leave a message and no one ever calls me back; I have been seeing a urologist, but I haven't seen my regular doctor in over a year, and would like to.  I am no longer taking the Zoloft and never took that for depression-- I took it for headaches, it was what the headache clinic told me to take-- but I have been off of it for about 2 years and am in no way struggling with depression.  I would appreciate it if you could have someone contact me to get a visit scheduled with my PCP, I have called so many times, I just don't even want to try any more because I never get a call-back"  Interventions provided: care coordination outreach placed to PCP scheduling team and to practice manager to facilitate scheduling of office visit; confirmed prior to signing of chart that PCP office visit has been scheduled for 02/21/22-- no further or ongoing care coordination needs identified    Goals Addressed             This Visit's Progress    COMPLETED: Care Coordination Activities: no follow up required   On track    Care Coordination Interventions: Evaluation of current treatment plan related to frequent UTI and patient's adherence to plan as established by provider Advised patient to consider going to [facility] to have the symptoms she reports today addressed-- she reports has been trying to make PCP office visit, needs call back from scheduling team: I placed a care coordination outreach to both the PCP practice and  the practice manager to facilitate getting this appointment scheduled Provided education to patient re: signs/ symptoms UTI along with corresponding action plan; risks of taking "old"/ expired medication Reviewed medications with patient and discussed adherence: she reports good adherence; medication list updated according to patient report today Collaborated with PCP scheduling team and practice manager to facilitate scheduling of PCP appointment regarding frequent UTI Reviewed scheduled/upcoming provider appointments including none Advised patient to discuss frequent UTI symptoms/ plan of care with provider Screening for signs and symptoms of depression related to chronic disease state  Assessed social determinant of health barriers Confirmed patient does not "ever" obtain flu vaccines; encouraged her to practice standard infection control practices to prevent illness during upcoming flu/ winter season          SDOH assessments and interventions completed:  Yes  SDOH Interventions Today    Flowsheet Row Most Recent Value  SDOH Interventions   Food Insecurity Interventions Intervention Not Indicated  Transportation Interventions Intervention Not Indicated  [drives self]       Care Coordination Interventions Activated:  Yes  Care Coordination Interventions:  Yes, provided   Follow up plan:  no further or ongoing care coordination needs identified     Encounter Outcome:  Pt. Visit Completed   Oneta Rack, RN, BSN, CCRN Alumnus RN CM Care Coordination/ Transition of [place] [facility] [phone]: direct  office

## 2022-02-06 DIAGNOSIS — H0012 Chalazion right lower eyelid: Secondary | ICD-10-CM | POA: Diagnosis not present

## 2022-02-06 DIAGNOSIS — H0011 Chalazion right upper eyelid: Secondary | ICD-10-CM | POA: Diagnosis not present

## 2022-02-15 DIAGNOSIS — N390 Urinary tract infection, site not specified: Secondary | ICD-10-CM | POA: Diagnosis not present

## 2022-02-20 NOTE — Progress Notes (Unsigned)
   Established Patient Office Visit  Subjective   Patient ID: Selena May, female   DOB: March 24, 1951 Age: 71 y.o. MRN: [id number]   No chief complaint on file.   HPI Pleasant 71 year old female presenting today to transfer care to a new PCP and for the following:   Objective:    There were no vitals filed for this visit.  Physical Exam   No results found for this or any previous visit (from the past 24 hour(s)).   {Labs (Optional):23779}  The ASCVD Risk score (Arnett DK, et al., 2019) failed to calculate for the following reasons:   Cannot find a previous HDL lab   Cannot find a previous total cholesterol lab   Assessment & Plan:   No problem-specific Assessment & Plan notes found for this encounter.   No follow-ups on file.  ___________________________________________ Thayer Ohm, DNP, APRN, FNP-BC Primary Care and Sports Medicine [facility]

## 2022-02-21 ENCOUNTER — Ambulatory Visit (INDEPENDENT_AMBULATORY_CARE_PROVIDER_SITE_OTHER): Payer: Medicare Other | Admitting: Medical-Surgical

## 2022-02-21 DIAGNOSIS — Z91199 Patient's noncompliance with other medical treatment and regimen due to unspecified reason: Secondary | ICD-10-CM

## 2022-02-21 DIAGNOSIS — N390 Urinary tract infection, site not specified: Secondary | ICD-10-CM

## 2022-02-21 DIAGNOSIS — Z7689 Persons encountering health services in other specified circumstances: Secondary | ICD-10-CM

## 2022-02-21 DIAGNOSIS — I951 Orthostatic hypotension: Secondary | ICD-10-CM

## 2022-02-27 DIAGNOSIS — H0011 Chalazion right upper eyelid: Secondary | ICD-10-CM | POA: Diagnosis not present

## 2022-02-27 DIAGNOSIS — H0012 Chalazion right lower eyelid: Secondary | ICD-10-CM | POA: Diagnosis not present

## 2022-04-04 DIAGNOSIS — H0015 Chalazion left lower eyelid: Secondary | ICD-10-CM | POA: Diagnosis not present

## 2022-04-12 DIAGNOSIS — F32A Depression, unspecified: Secondary | ICD-10-CM | POA: Diagnosis not present

## 2022-04-12 DIAGNOSIS — N390 Urinary tract infection, site not specified: Secondary | ICD-10-CM | POA: Diagnosis not present

## 2022-04-12 DIAGNOSIS — Z87440 Personal history of urinary (tract) infections: Secondary | ICD-10-CM | POA: Diagnosis not present

## 2022-05-02 DIAGNOSIS — H0015 Chalazion left lower eyelid: Secondary | ICD-10-CM | POA: Diagnosis not present

## 2022-05-09 DIAGNOSIS — N390 Urinary tract infection, site not specified: Secondary | ICD-10-CM | POA: Diagnosis not present

## 2022-05-09 DIAGNOSIS — R8290 Unspecified abnormal findings in urine: Secondary | ICD-10-CM | POA: Diagnosis not present

## 2022-07-04 DIAGNOSIS — N390 Urinary tract infection, site not specified: Secondary | ICD-10-CM | POA: Diagnosis not present

## 2022-08-08 DIAGNOSIS — M722 Plantar fascial fibromatosis: Secondary | ICD-10-CM | POA: Diagnosis not present

## 2022-09-04 DIAGNOSIS — H16041 Marginal corneal ulcer, right eye: Secondary | ICD-10-CM | POA: Diagnosis not present

## 2022-09-18 DIAGNOSIS — H16041 Marginal corneal ulcer, right eye: Secondary | ICD-10-CM | POA: Diagnosis not present

## 2022-09-19 DIAGNOSIS — M722 Plantar fascial fibromatosis: Secondary | ICD-10-CM | POA: Diagnosis not present

## 2022-10-17 DIAGNOSIS — B009 Herpesviral infection, unspecified: Secondary | ICD-10-CM | POA: Diagnosis not present

## 2022-10-31 DIAGNOSIS — B009 Herpesviral infection, unspecified: Secondary | ICD-10-CM | POA: Diagnosis not present

## 2022-11-09 DIAGNOSIS — N390 Urinary tract infection, site not specified: Secondary | ICD-10-CM | POA: Diagnosis not present

## 2022-11-21 DIAGNOSIS — B009 Herpesviral infection, unspecified: Secondary | ICD-10-CM | POA: Diagnosis not present

## 2022-12-19 DIAGNOSIS — H0011 Chalazion right upper eyelid: Secondary | ICD-10-CM | POA: Diagnosis not present

## 2023-06-05 ENCOUNTER — Ambulatory Visit (INDEPENDENT_AMBULATORY_CARE_PROVIDER_SITE_OTHER): Admitting: Sports Medicine

## 2023-06-05 ENCOUNTER — Encounter: Payer: Self-pay | Admitting: Sports Medicine

## 2023-06-05 VITALS — BP 126/62 | HR 82 | Temp 98.8°F | Resp 20 | Wt 196.1 lb

## 2023-06-05 DIAGNOSIS — I517 Cardiomegaly: Secondary | ICD-10-CM | POA: Diagnosis not present

## 2023-06-05 DIAGNOSIS — J1008 Influenza due to other identified influenza virus with other specified pneumonia: Secondary | ICD-10-CM | POA: Diagnosis not present

## 2023-06-05 DIAGNOSIS — E860 Dehydration: Secondary | ICD-10-CM | POA: Diagnosis not present

## 2023-06-05 DIAGNOSIS — Z9884 Bariatric surgery status: Secondary | ICD-10-CM | POA: Diagnosis not present

## 2023-06-05 DIAGNOSIS — Z90710 Acquired absence of both cervix and uterus: Secondary | ICD-10-CM | POA: Diagnosis not present

## 2023-06-05 DIAGNOSIS — N179 Acute kidney failure, unspecified: Secondary | ICD-10-CM | POA: Diagnosis not present

## 2023-06-05 DIAGNOSIS — J101 Influenza due to other identified influenza virus with other respiratory manifestations: Secondary | ICD-10-CM | POA: Diagnosis not present

## 2023-06-05 DIAGNOSIS — Z883 Allergy status to other anti-infective agents status: Secondary | ICD-10-CM | POA: Diagnosis not present

## 2023-06-05 DIAGNOSIS — D509 Iron deficiency anemia, unspecified: Secondary | ICD-10-CM | POA: Diagnosis not present

## 2023-06-05 DIAGNOSIS — R918 Other nonspecific abnormal finding of lung field: Secondary | ICD-10-CM | POA: Diagnosis not present

## 2023-06-05 DIAGNOSIS — A419 Sepsis, unspecified organism: Secondary | ICD-10-CM | POA: Diagnosis not present

## 2023-06-05 DIAGNOSIS — Z79899 Other long term (current) drug therapy: Secondary | ICD-10-CM | POA: Diagnosis not present

## 2023-06-05 DIAGNOSIS — Z9049 Acquired absence of other specified parts of digestive tract: Secondary | ICD-10-CM | POA: Diagnosis not present

## 2023-06-05 DIAGNOSIS — Z9089 Acquired absence of other organs: Secondary | ICD-10-CM | POA: Diagnosis not present

## 2023-06-05 DIAGNOSIS — F32A Depression, unspecified: Secondary | ICD-10-CM | POA: Diagnosis not present

## 2023-06-05 DIAGNOSIS — Z91148 Patient's other noncompliance with medication regimen for other reason: Secondary | ICD-10-CM | POA: Diagnosis not present

## 2023-06-05 DIAGNOSIS — R6889 Other general symptoms and signs: Secondary | ICD-10-CM

## 2023-06-05 DIAGNOSIS — N390 Urinary tract infection, site not specified: Secondary | ICD-10-CM | POA: Diagnosis not present

## 2023-06-05 DIAGNOSIS — Z7982 Long term (current) use of aspirin: Secondary | ICD-10-CM | POA: Diagnosis not present

## 2023-06-05 DIAGNOSIS — J15212 Pneumonia due to Methicillin resistant Staphylococcus aureus: Secondary | ICD-10-CM | POA: Diagnosis not present

## 2023-06-05 DIAGNOSIS — J189 Pneumonia, unspecified organism: Secondary | ICD-10-CM | POA: Diagnosis not present

## 2023-06-05 DIAGNOSIS — A4189 Other specified sepsis: Secondary | ICD-10-CM | POA: Diagnosis not present

## 2023-06-05 DIAGNOSIS — Z881 Allergy status to other antibiotic agents status: Secondary | ICD-10-CM | POA: Diagnosis not present

## 2023-06-05 DIAGNOSIS — Z1152 Encounter for screening for COVID-19: Secondary | ICD-10-CM | POA: Diagnosis not present

## 2023-06-05 DIAGNOSIS — R7989 Other specified abnormal findings of blood chemistry: Secondary | ICD-10-CM | POA: Diagnosis not present

## 2023-06-05 DIAGNOSIS — A09 Infectious gastroenteritis and colitis, unspecified: Secondary | ICD-10-CM | POA: Diagnosis not present

## 2023-06-05 DIAGNOSIS — Z7409 Other reduced mobility: Secondary | ICD-10-CM | POA: Diagnosis not present

## 2023-06-05 DIAGNOSIS — R0602 Shortness of breath: Secondary | ICD-10-CM | POA: Diagnosis not present

## 2023-06-05 DIAGNOSIS — J9601 Acute respiratory failure with hypoxia: Secondary | ICD-10-CM | POA: Diagnosis not present

## 2023-06-05 DIAGNOSIS — R112 Nausea with vomiting, unspecified: Secondary | ICD-10-CM | POA: Diagnosis not present

## 2023-06-05 DIAGNOSIS — E785 Hyperlipidemia, unspecified: Secondary | ICD-10-CM | POA: Diagnosis not present

## 2023-06-05 DIAGNOSIS — R197 Diarrhea, unspecified: Secondary | ICD-10-CM | POA: Diagnosis not present

## 2023-06-05 DIAGNOSIS — R109 Unspecified abdominal pain: Secondary | ICD-10-CM | POA: Diagnosis not present

## 2023-06-05 DIAGNOSIS — Z888 Allergy status to other drugs, medicaments and biological substances status: Secondary | ICD-10-CM | POA: Diagnosis not present

## 2023-06-05 DIAGNOSIS — Z882 Allergy status to sulfonamides status: Secondary | ICD-10-CM | POA: Diagnosis not present

## 2023-06-05 DIAGNOSIS — J9811 Atelectasis: Secondary | ICD-10-CM | POA: Diagnosis not present

## 2023-06-05 LAB — POCT RAPID STREP A (OFFICE): Rapid Strep A Screen: NEGATIVE

## 2023-06-05 LAB — POCT INFLUENZA A/B
Influenza A, POC: POSITIVE — AB
Influenza B, POC: NEGATIVE

## 2023-06-05 LAB — POC COVID19 BINAXNOW: SARS Coronavirus 2 Ag: NEGATIVE

## 2023-06-05 NOTE — Assessment & Plan Note (Signed)
 Selena May is a 73 year old female, she does have a history of obesity, for the past 6 days she has had fevers of well over 102, cough, malaise, shortness of breath. Her son has similar symptoms. Vitals are today with an adequate pulse oximetry, 96% on room air. Respiratory rate is high, 22 breaths/min. She also has some nausea, vomiting, she does notice some bloody tinged and brown-tinged vomitus. In the exam room today she appears unwell, she does appear to be in respiratory distress, she is having trouble speaking full sentences, she does have visible accessory muscle use. She has a normal oropharyngeal exam, she does have coarse lung sounds predominant in the right lung field. We did a COVID, flu, strep swab, however considering her respiratory distress she really does need to present to the ED for more urgent evaluation and imaging.

## 2023-06-05 NOTE — Progress Notes (Signed)
    Procedures performed today:    None.  Independent interpretation of notes and tests performed by another provider:   None.  Brief History, Exam, Impression, and Recommendations:    Shortness of breath Selena May is a 73 year old female, she does have a history of obesity, for the past 6 days she has had fevers of well over 102, cough, malaise, shortness of breath. Her son has similar symptoms. Vitals are today with an adequate pulse oximetry, 96% on room air. Respiratory rate is high, 22 breaths/min. She also has some nausea, vomiting, she does notice some bloody tinged and brown-tinged vomitus. In the exam room today she appears unwell, she does appear to be in respiratory distress, she is having trouble speaking full sentences, she does have visible accessory muscle use. She has a normal oropharyngeal exam, she does have coarse lung sounds predominant in the right lung field. We did a COVID, flu, strep swab, however considering her respiratory distress she really does need to present to the ED for more urgent evaluation and imaging.  I spent 40 minutes of total time managing this patient today, this includes chart review, face to face, and non-face to face time.  ____________________________________________ Ihor Austin. Benjamin Stain, M.D., ABFM., CAQSM., AME. Primary Care and Sports Medicine [facility]  Adjunct Professor of Family Medicine  [employer] [employer] [facility] [employer]  Restaurant manager, fast food

## 2023-06-19 ENCOUNTER — Ambulatory Visit (INDEPENDENT_AMBULATORY_CARE_PROVIDER_SITE_OTHER): Admitting: Sports Medicine

## 2023-06-19 ENCOUNTER — Encounter: Payer: Self-pay | Admitting: Sports Medicine

## 2023-06-19 VITALS — BP 100/59 | HR 78 | Resp 20 | Ht 61.0 in | Wt 197.1 lb

## 2023-06-19 DIAGNOSIS — R300 Dysuria: Secondary | ICD-10-CM | POA: Diagnosis not present

## 2023-06-19 DIAGNOSIS — N1370 Vesicoureteral-reflux, unspecified: Secondary | ICD-10-CM | POA: Diagnosis not present

## 2023-06-19 DIAGNOSIS — J15212 Pneumonia due to Methicillin resistant Staphylococcus aureus: Secondary | ICD-10-CM

## 2023-06-19 LAB — POCT URINALYSIS DIP (CLINITEK)
Bilirubin, UA: NEGATIVE
Glucose, UA: NEGATIVE mg/dL
Ketones, POC UA: NEGATIVE mg/dL
Nitrite, UA: NEGATIVE
POC PROTEIN,UA: 30 — AB
Spec Grav, UA: 1.025 (ref 1.010–1.025)
Urobilinogen, UA: 0.2 U/dL
pH, UA: 5.5 (ref 5.0–8.0)

## 2023-06-19 MED ORDER — NITROFURANTOIN MONOHYD MACRO 100 MG PO CAPS: 100.0000 mg | ORAL_CAPSULE | Freq: Two times a day (BID) | ORAL | 0 refills | Status: DC

## 2023-06-19 NOTE — Assessment & Plan Note (Signed)
 Selena May returns, I saw her on the fourth of this month, she was seen with shortness of breath, she was in obvious respiratory distress, her flu test did come back positive, we sent her to the hospital due to hypoxia with pulse oximetry in the upper 80s. She was diagnosed and treated for sepsis and MRSA pneumonia. Currently finishing up doxycycline, prednisone. She is doing a lot better. We did discuss pneumococcal vaccination, she has not yet had this but I would like her to consider it.

## 2023-06-19 NOTE — Progress Notes (Signed)
    Procedures performed today:    None.  Independent interpretation of notes and tests performed by another provider:   None.  Brief History, Exam, Impression, and Recommendations:    VUR (vesicoureteric reflux) Selena May has frequent urinary tract infections, she has vesicoureteric reflux. She had a urinary tract infection during her hospitalization, improved to limit with doxycycline, I informed her that doxycycline was really not the best antibiotic for urinary tract bacteria. No urine cultures were done in the hospital, her last positive urine culture here grew out E. coli mostly pansensitive, we will recheck a urinalysis and culture today, and I am going to add Macrobid.  MRSA pneumonia (HCC) Selena May returns, I saw her on the fourth of this month, she was seen with shortness of breath, she was in obvious respiratory distress, her flu test did come back positive, we sent her to the hospital due to hypoxia with pulse oximetry in the upper 80s. She was diagnosed and treated for sepsis and MRSA pneumonia. Currently finishing up doxycycline, prednisone. She is doing a lot better. We did discuss pneumococcal vaccination, she has not yet had this but I would like her to consider it.    ____________________________________________ Ihor Austin. Benjamin Stain, M.D., ABFM., CAQSM., AME. Primary Care and Sports Medicine [facility]  Adjunct Professor of Family Medicine  [place] [employer] [facility] [employer]  Restaurant manager, fast food

## 2023-06-19 NOTE — Assessment & Plan Note (Signed)
 Olegario Messier has frequent urinary tract infections, she has vesicoureteric reflux. She had a urinary tract infection during her hospitalization, improved to limit with doxycycline, I informed her that doxycycline was really not the best antibiotic for urinary tract bacteria. No urine cultures were done in the hospital, her last positive urine culture here grew out E. coli mostly pansensitive, we will recheck a urinalysis and culture today, and I am going to add Macrobid.

## 2023-06-21 LAB — URINE CULTURE

## 2023-06-25 ENCOUNTER — Ambulatory Visit (INDEPENDENT_AMBULATORY_CARE_PROVIDER_SITE_OTHER)

## 2023-06-25 VITALS — Ht 61.5 in | Wt 189.0 lb

## 2023-06-25 DIAGNOSIS — Z13820 Encounter for screening for osteoporosis: Secondary | ICD-10-CM

## 2023-06-25 DIAGNOSIS — Z1211 Encounter for screening for malignant neoplasm of colon: Secondary | ICD-10-CM

## 2023-06-25 DIAGNOSIS — Z0000 Encounter for general adult medical examination without abnormal findings: Secondary | ICD-10-CM

## 2023-06-25 DIAGNOSIS — Z1231 Encounter for screening mammogram for malignant neoplasm of breast: Secondary | ICD-10-CM

## 2023-06-25 NOTE — Patient Instructions (Signed)
  Ms. Rocco , Thank you for taking time to come for your Medicare Wellness Visit. I appreciate your ongoing commitment to your health goals. Please review the following plan we discussed and let me know if I can assist you in the future.   These are the goals we discussed:  Goals      DIET - INCREASE WATER INTAKE     She would like to try and drink more water.         This is a list of the screening recommended for you and due dates:  Health Maintenance  Topic Date Due   Hepatitis C Screening  Never done   Cologuard (Stool DNA test)  Never done   Zoster (Shingles) Vaccine (1 of 2) Never done   Pneumonia Vaccine (1 of 1 - PCV) Never done   Mammogram  06/28/2019   Flu Shot  Never done   COVID-19 Vaccine (1 - 2024-25 season) Never done   DTaP/Tdap/Td vaccine (2 - Td or Tdap) 04/04/2023   Medicare Annual Wellness Visit  06/24/2024   DEXA scan (bone density measurement)  Completed   HPV Vaccine  Aged Out

## 2023-06-25 NOTE — Progress Notes (Signed)
 Subjective:   Selena May is a 73 y.o. female who presents for Medicare Annual (Subsequent) preventive examination.  Visit Complete: Virtual I connected with  Selena May on 06/25/23 by a audio enabled telemedicine application and verified that I am speaking with the correct person using two identifiers.  Patient Location: Home  Provider Location: Office/Clinic  I discussed the limitations of evaluation and management by telemedicine. The patient expressed understanding and agreed to proceed.  Vital Signs: Because this visit was a virtual/telehealth visit, some criteria may be missing or patient reported. Any vitals not documented were not able to be obtained and vitals that have been documented are patient reported.  Patient Medicare AWV questionnaire was completed by the patient on 06/06/2023; I have confirmed that all information answered by patient is correct and no changes since this date.  Cardiac Risk Factors include: advanced age (>74men, >67 women);obesity (BMI >30kg/m2)     Objective:    Today's Vitals   06/25/23 1550  Weight: 189 lb (85.7 kg)  Height: 5' 1.5" (1.562 m)   Body mass index is 35.13 kg/m.     06/25/2023    4:06 PM 11/25/2019    7:15 AM 11/18/2019    2:45 PM 04/24/2019    6:34 AM 04/18/2019   10:51 AM 03/23/2017    2:54 PM  Advanced Directives  Does Patient Have a Medical Advance Directive? Yes Yes Yes No No No  Type of Estate agent of [place];Living will Healthcare Power of [place];Living will Healthcare Power of [place];Living will     Does patient want to make changes to medical advance directive?  No - Patient declined      Copy of Healthcare Power of Attorney in Chart? No - copy requested No - copy requested      Would patient like information on creating a medical advance directive?    No - Patient declined No - Patient declined Yes (ED - Information included in AVS)    Current Medications (verified) Outpatient  Encounter Medications as of 06/25/2023  Medication Sig   cholecalciferol (VITAMIN D3) 25 MCG (1000 UNIT) tablet Take 1,000 Units by mouth daily.   ferrous sulfate 325 (65 FE) MG tablet Take 325 mg by mouth daily with breakfast.   traZODone (DESYREL) 50 MG tablet Take 1-2 tablets (50-100 mg total) by mouth at bedtime as needed for sleep.   BIOTIN PO Take 1 tablet by mouth daily. (Patient not taking: Reported on 06/25/2023)   diclofenac sodium (VOLTAREN) 1 % GEL Apply 4 g topically 4 (four) times daily. To affected joint. (Patient not taking: Reported on 11/07/2019)   gabapentin (NEURONTIN) 300 MG capsule Take 1 capsule (300 mg total) by mouth at bedtime. RESTLESS LEGS (Patient not taking: Reported on 01/31/2022)   promethazine (PHENERGAN) 25 MG tablet Take 1 tablet (25 mg total) by mouth every 6 (six) hours as needed for nausea or vomiting. (Patient not taking: Reported on 06/25/2023)   [DISCONTINUED] Cephalexin 250 MG tablet Take 1 tablet daily. (Patient not taking: Reported on 11/07/2019)   [DISCONTINUED] Cephalexin 500 MG tablet Take 1 tablet (500mg ) three times daily for 5 days then reduce to 1 tablet once daily until urology evaluation. (Patient not taking: Reported on 11/07/2019)   [DISCONTINUED] IRON PO Take by mouth.   [DISCONTINUED] nitrofurantoin, macrocrystal-monohydrate, (MACROBID) 100 MG capsule Take 1 capsule (100 mg total) by mouth 2 (two) times daily.   [DISCONTINUED] sertraline (ZOLOFT) 50 MG tablet TAKE 1 TABLET BY MOUTH EVERY  DAY (Patient not taking: Reported on 06/25/2023)   No facility-administered encounter medications on file as of 06/25/2023.    Allergies (verified) Ciprofloxacin, Oxybutynin, and Sulfa antibiotics   History: Past Medical History:  Diagnosis Date   Anxiety    Chronic kidney disease    Impingement syndrome of right shoulder    Iron deficiency anemia 12/15/2011   Leukocytosis 12/15/2011   Osteopenia determined by x-ray 06/27/2017   March 2019 T score -2.3    Pyelonephritis    Renal mass 11/30/2011   Sleep apnea    lost weight and does not use CPAP now   Thrombocytosis (? 2/2 Fe Deficiency) 12/15/2011   VUR (vesicoureteric reflux) 11/30/2011   Past Surgical History:  Procedure Laterality Date   ABDOMINAL HYSTERECTOMY     APPENDECTOMY     CHOLECYSTECTOMY     CYSTOSCOPY WITH URETEROSCOPY N/A 11/25/2019   Procedure: DIAGNOSTIC URETEROSCOPY;  Surgeon: Rene Paci, MD;  Location: [facility];  Service: Urology;  Laterality: N/A;   GASTRIC BYPASS     KIDNEY SURGERY     KNEE SURGERY     left x 2 right x 1   SHOULDER ARTHROSCOPY WITH ROTATOR CUFF REPAIR Right 04/24/2019   Procedure: RIGHT SHOULDER ARTHROSCOPY WITH ROTATOR CUFF REPAIR;  Surgeon: Bjorn Pippin, MD;  Location: [facility];  Service: Orthopedics;  Laterality: Right;   TONSILLECTOMY     Family History  Problem Relation Age of Onset   Alzheimer's disease Mother    COPD Mother    Hypertension Mother    Heart attack Mother    Cancer Father        Lung   Heart attack Sister    Diabetes Sister    Heart failure Sister    Heart attack Brother    Cancer Brother        Lung   Social History   Socioeconomic History   Marital status: Married    Spouse name: Not on file   Number of children: Not on file   Years of education: Not on file   Highest education level: Not on file  Occupational History   Not on file  Tobacco Use   Smoking status: Never   Smokeless tobacco: Never  Vaping Use   Vaping status: Never Used  Substance and Sexual Activity   Alcohol use: No   Drug use: No   Sexual activity: Not Currently    Birth control/protection: Post-menopausal  Other Topics Concern   Not on file  Social History Narrative   Ethal lives alone. She enjoys crafts, singing and dancing. She does a lot of traveling.    Social Drivers of Corporate investment banker Strain: Low Risk  (06/25/2023)   Overall Financial Resource Strain (CARDIA)    Difficulty of Paying  Living Expenses: Not hard at all  Food Insecurity: No Food Insecurity (06/25/2023)   Hunger Vital Sign    Worried About Running Out of Food in the Last Year: Never true    Ran Out of Food in the Last Year: Never true  Transportation Needs: No Transportation Needs (06/25/2023)   PRAPARE - Administrator, Civil Service (Medical): No    Lack of Transportation (Non-Medical): No  Physical Activity: Insufficiently Active (06/25/2023)   Exercise Vital Sign    Days of Exercise per Week: 2 days    Minutes of Exercise per Session: 60 min  Stress: No Stress Concern Present (06/25/2023)   [employer] of Occupational Health -  Occupational Stress Questionnaire    Feeling of Stress : Not at all  Social Connections: Moderately Integrated (06/25/2023)   Social Connection and Isolation Panel [NHANES]    Frequency of Communication with Friends and Family: More than three times a week    Frequency of Social Gatherings with Friends and Family: More than three times a week    Attends Religious Services: More than 4 times per year    Active Member of [employer] or Organizations: Yes    Attends Banker Meetings: More than 4 times per year    Marital Status: Widowed    Tobacco Counseling Counseling given: Not Answered   Clinical Intake:  Pre-visit preparation completed: Yes  Pain : No/denies pain     BMI - recorded: 35.13 Nutritional Status: BMI > 30  Obese Nutritional Risks: None Diabetes: No  How often do you need to have someone help you when you read instructions, pamphlets, or other written materials from your doctor or pharmacy?: 1 - Never What is the last grade level you completed in school?: 14  Interpreter Needed?: No      Activities of Daily Living    06/25/2023    3:52 PM  In your present state of health, do you have any difficulty performing the following activities:  Hearing? 1  Comment Left ear  Vision? 0  Difficulty concentrating or making  decisions? 0  Walking or climbing stairs? 1  Comment Some knee pain  Dressing or bathing? 0  Doing errands, shopping? 0  Preparing Food and eating ? N  Using the Toilet? N  In the past six months, have you accidently leaked urine? Y  Do you have problems with loss of bowel control? N  Managing your Medications? N  Managing your Finances? N  Housekeeping or managing your Housekeeping? N    Patient Care Team: Monica Becton, MD as PCP - General (Sports Medicine)  Indicate any recent Medical Services you may have received from other than [facility] providers in the past year (date may be approximate).     Assessment:   This is a routine wellness examination for Chauntelle.  Hearing/Vision screen No results found.   Goals Addressed             This Visit's Progress    DIET - INCREASE WATER INTAKE       She would like to try and drink more water.       Depression Screen    06/25/2023    4:04 PM 01/31/2022    1:25 PM 12/13/2020   10:52 AM 03/04/2018    1:14 PM 03/28/2017    2:12 PM 03/28/2017    1:11 PM  PHQ 2/9 Scores  PHQ - 2 Score 0 0 5 0 0 0  PHQ- 9 Score   13 8      Fall Risk     No data to display           MEDICARE RISK AT HOME: Medicare Risk at Home Any stairs in or around the home?: Yes If so, are there any without handrails?: No Home free of loose throw rugs in walkways, pet beds, electrical cords, etc?: Yes Adequate lighting in your home to reduce risk of falls?: Yes Life alert?: No Use of a cane, walker or w/c?: Yes Grab bars in the bathroom?: No Shower chair or bench in shower?: No Elevated toilet seat or a handicapped toilet?: No  TIMED UP AND GO:  Was the test performed?  No    Cognitive Function:        06/25/2023    4:08 PM  6CIT Screen  What Year? 0 points  What month? 0 points  What time? 0 points  Count back from 20 0 points  Months in reverse 0 points  Repeat phrase 0 points  Total Score 0 points     Immunizations Immunization History  Administered Date(s) Administered   Tdap 04/03/2013    TDAP status: Due, Education has been provided regarding the importance of this vaccine. Advised may receive this vaccine at local pharmacy or Health Dept. Aware to provide a copy of the vaccination record if obtained from local pharmacy or Health Dept. Verbalized acceptance and understanding.  Flu Vaccine status: Declined, Education has been provided regarding the importance of this vaccine but patient still declined. Advised may receive this vaccine at local pharmacy or Health Dept. Aware to provide a copy of the vaccination record if obtained from local pharmacy or Health Dept. Verbalized acceptance and understanding.  Pneumococcal vaccine status: Due, Education has been provided regarding the importance of this vaccine. Advised may receive this vaccine at local pharmacy or Health Dept. Aware to provide a copy of the vaccination record if obtained from local pharmacy or Health Dept. Verbalized acceptance and understanding.  Covid-19 vaccine status: Declined, Education has been provided regarding the importance of this vaccine but patient still declined. Advised may receive this vaccine at local pharmacy or Health Dept.or vaccine clinic. Aware to provide a copy of the vaccination record if obtained from local pharmacy or Health Dept. Verbalized acceptance and understanding.  Qualifies for Shingles Vaccine? Yes   Zostavax completed No   Shingrix Completed?: No.    Education has been provided regarding the importance of this vaccine. Patient has been advised to call insurance company to determine out of pocket expense if they have not yet received this vaccine. Advised may also receive vaccine at local pharmacy or Health Dept. Verbalized acceptance and understanding.  Screening Tests Health Maintenance  Topic Date Due   Hepatitis C Screening  Never done   Fecal DNA (Cologuard)  Never done   Zoster  Vaccines- Shingrix (1 of 2) Never done   Pneumonia Vaccine 78+ Years old (1 of 1 - PCV) Never done   MAMMOGRAM  06/28/2019   INFLUENZA VACCINE  Never done   COVID-19 Vaccine (1 - 2024-25 season) Never done   DTaP/Tdap/Td (2 - Td or Tdap) 04/04/2023   Medicare Annual Wellness (AWV)  06/24/2024   DEXA SCAN  Completed   HPV VACCINES  Aged Out    Health Maintenance  Health Maintenance Due  Topic Date Due   Hepatitis C Screening  Never done   Fecal DNA (Cologuard)  Never done   Zoster Vaccines- Shingrix (1 of 2) Never done   Pneumonia Vaccine 59+ Years old (1 of 1 - PCV) Never done   MAMMOGRAM  06/28/2019   INFLUENZA VACCINE  Never done   COVID-19 Vaccine (1 - 2024-25 season) Never done   DTaP/Tdap/Td (2 - Td or Tdap) 04/04/2023    Colorectal cancer screening: Referral to GI placed 06/25/2023. Pt aware the office will call re: appt.  Mammogram status: Ordered 06/25/2023. Pt provided with contact info and advised to call to schedule appt.   Bone Density status: Ordered 06/25/2023. Pt provided with contact info and advised to call to schedule appt.  Lung Cancer Screening: (Low Dose CT Chest recommended if Age 75-80 years, 20 pack-year currently smoking OR have  quit w/in 15years.) does not qualify.   Lung Cancer Screening Referral: n/a  Additional Screening:  Hepatitis C Screening: does qualify; Completed Not yet  Vision Screening: Recommended annual ophthalmology exams for early detection of glaucoma and other disorders of the eye. Is the patient up to date with their annual eye exam?  Yes  Who is the provider or what is the name of the office in which the patient attends annual eye exams? [facility] If pt is not established with a provider, would they like to be referred to a provider to establish care?  N/a .   Dental Screening: Recommended annual dental exams for proper oral hygiene    Community Resource Referral / Chronic Care Management: CRR required this visit?  No    CCM required this visit?  No     Plan:     I have personally reviewed and noted the following in the patient's chart:   Medical and social history Use of alcohol, tobacco or illicit drugs  Current medications and supplements including opioid prescriptions. Patient is not currently taking opioid prescriptions. Functional ability and status Nutritional status Physical activity Advanced directives List of other physicians Hospitalizations, surgeries, and ER visits in previous 12 months yes - 06/05/2023 - no surgeries Vitals Screenings to include cognitive, depression, and falls Referrals and appointments  In addition, I have reviewed and discussed with patient certain preventive protocols, quality metrics, and best practice recommendations. A written personalized care plan for preventive services as well as general preventive health recommendations were provided to patient.     Esmond Harps, CMA   06/25/2023   After Visit Summary: (Mail) Due to this being a telephonic visit, the after visit summary with patients personalized plan was offered to patient via mail   Nurse Notes:    KARRINA LYE is a 73 y.o. y.o. female patient of Rodney Langton, MD who had a Medicare Annual Wellness Visit today via telephone. She reports that he is socially active and does interact with friends/family regularly. She is moderately physically active. She enjoys crafts, singing and dancing. She travels a lot with her son and daughter-in-law.

## 2023-10-25 DIAGNOSIS — M722 Plantar fascial fibromatosis: Secondary | ICD-10-CM | POA: Diagnosis not present

## 2023-12-04 ENCOUNTER — Encounter: Payer: Self-pay | Admitting: Sports Medicine

## 2023-12-20 DIAGNOSIS — Z833 Family history of diabetes mellitus: Secondary | ICD-10-CM | POA: Diagnosis not present

## 2023-12-20 DIAGNOSIS — Z79899 Other long term (current) drug therapy: Secondary | ICD-10-CM | POA: Diagnosis not present

## 2023-12-20 DIAGNOSIS — N281 Cyst of kidney, acquired: Secondary | ICD-10-CM | POA: Diagnosis not present

## 2023-12-20 DIAGNOSIS — Z87440 Personal history of urinary (tract) infections: Secondary | ICD-10-CM | POA: Diagnosis not present

## 2023-12-20 DIAGNOSIS — K5730 Diverticulosis of large intestine without perforation or abscess without bleeding: Secondary | ICD-10-CM | POA: Diagnosis not present

## 2023-12-20 DIAGNOSIS — N323 Diverticulum of bladder: Secondary | ICD-10-CM | POA: Diagnosis not present

## 2023-12-20 DIAGNOSIS — F32A Depression, unspecified: Secondary | ICD-10-CM | POA: Diagnosis not present

## 2023-12-20 DIAGNOSIS — I080 Rheumatic disorders of both mitral and aortic valves: Secondary | ICD-10-CM | POA: Diagnosis not present

## 2023-12-20 DIAGNOSIS — S0101XA Laceration without foreign body of scalp, initial encounter: Secondary | ICD-10-CM | POA: Diagnosis not present

## 2023-12-20 DIAGNOSIS — Z881 Allergy status to other antibiotic agents status: Secondary | ICD-10-CM | POA: Diagnosis not present

## 2023-12-20 DIAGNOSIS — B9620 Unspecified Escherichia coli [E. coli] as the cause of diseases classified elsewhere: Secondary | ICD-10-CM | POA: Diagnosis not present

## 2023-12-20 DIAGNOSIS — Z6835 Body mass index (BMI) 35.0-35.9, adult: Secondary | ICD-10-CM | POA: Diagnosis not present

## 2023-12-20 DIAGNOSIS — I6523 Occlusion and stenosis of bilateral carotid arteries: Secondary | ICD-10-CM | POA: Diagnosis not present

## 2023-12-20 DIAGNOSIS — Z888 Allergy status to other drugs, medicaments and biological substances status: Secondary | ICD-10-CM | POA: Diagnosis not present

## 2023-12-20 DIAGNOSIS — Z9049 Acquired absence of other specified parts of digestive tract: Secondary | ICD-10-CM | POA: Diagnosis not present

## 2023-12-20 DIAGNOSIS — W19XXXA Unspecified fall, initial encounter: Secondary | ICD-10-CM | POA: Diagnosis not present

## 2023-12-20 DIAGNOSIS — B9689 Other specified bacterial agents as the cause of diseases classified elsewhere: Secondary | ICD-10-CM | POA: Diagnosis not present

## 2023-12-20 DIAGNOSIS — R410 Disorientation, unspecified: Secondary | ICD-10-CM | POA: Diagnosis not present

## 2023-12-20 DIAGNOSIS — R739 Hyperglycemia, unspecified: Secondary | ICD-10-CM | POA: Diagnosis not present

## 2023-12-20 DIAGNOSIS — R55 Syncope and collapse: Secondary | ICD-10-CM | POA: Diagnosis not present

## 2023-12-20 DIAGNOSIS — R079 Chest pain, unspecified: Secondary | ICD-10-CM | POA: Diagnosis not present

## 2023-12-20 DIAGNOSIS — I700 Atherosclerosis of aorta: Secondary | ICD-10-CM | POA: Diagnosis not present

## 2023-12-20 DIAGNOSIS — M79641 Pain in right hand: Secondary | ICD-10-CM | POA: Diagnosis not present

## 2023-12-20 DIAGNOSIS — W228XXA Striking against or struck by other objects, initial encounter: Secondary | ICD-10-CM | POA: Diagnosis not present

## 2023-12-20 DIAGNOSIS — R569 Unspecified convulsions: Secondary | ICD-10-CM | POA: Diagnosis not present

## 2023-12-20 DIAGNOSIS — Z9884 Bariatric surgery status: Secondary | ICD-10-CM | POA: Diagnosis not present

## 2023-12-20 DIAGNOSIS — N390 Urinary tract infection, site not specified: Secondary | ICD-10-CM | POA: Diagnosis not present

## 2023-12-20 DIAGNOSIS — Z882 Allergy status to sulfonamides status: Secondary | ICD-10-CM | POA: Diagnosis not present

## 2023-12-20 DIAGNOSIS — R58 Hemorrhage, not elsewhere classified: Secondary | ICD-10-CM | POA: Diagnosis not present

## 2023-12-20 DIAGNOSIS — A4151 Sepsis due to Escherichia coli [E. coli]: Secondary | ICD-10-CM | POA: Diagnosis not present

## 2023-12-20 DIAGNOSIS — R4182 Altered mental status, unspecified: Secondary | ICD-10-CM | POA: Diagnosis not present

## 2023-12-20 DIAGNOSIS — D509 Iron deficiency anemia, unspecified: Secondary | ICD-10-CM | POA: Diagnosis not present

## 2023-12-20 DIAGNOSIS — N10 Acute tubulo-interstitial nephritis: Secondary | ICD-10-CM | POA: Diagnosis not present

## 2023-12-20 DIAGNOSIS — A419 Sepsis, unspecified organism: Secondary | ICD-10-CM | POA: Diagnosis not present

## 2023-12-20 DIAGNOSIS — R7989 Other specified abnormal findings of blood chemistry: Secondary | ICD-10-CM | POA: Diagnosis not present

## 2023-12-20 DIAGNOSIS — I499 Cardiac arrhythmia, unspecified: Secondary | ICD-10-CM | POA: Diagnosis not present

## 2023-12-20 DIAGNOSIS — M47812 Spondylosis without myelopathy or radiculopathy, cervical region: Secondary | ICD-10-CM | POA: Diagnosis not present

## 2023-12-20 NOTE — ED Provider Notes (Signed)
 [facility]  ED Provider Note  Selena May 73 y.o. female DOB: 1950-05-09 MRN: [id number] History  No chief complaint on file.  Patient comes today via EMS after having a syncopal event.  She was in the grocery store and passed out.  She does not recall the events of this.  Apparently she hit her head.  Paramedics noted a possible left facial droop and route although they do have her in a c-collar which is ill-fitting.  She denies weakness numbness tingling arms or legs.  No chest pain or short of breath.  No vomiting or diarrhea.   History provided by:  Patient and EMS personnel Language interpreter used: No        Past Medical History:  Diagnosis Date  . Depression   . IDA (iron deficiency anemia) 12/15/2011  . 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 Seizures  (*)   . Thrombocytosis 12/15/2011  . VUR (vesicoureteric reflux) 11/30/2011    Past Surgical History:  Procedure Laterality Date  . Appendectomy    . Cholecystectomy    . Gastric bypass    . Heel spur surgery    . Hysterectomy    . Knee surgery Left    x2  . Left ureteral reimplant Left   . Pubovaginal sling    . Shoulder surgery    . Tonsillectomy      Social History   Substance and Sexual Activity  Alcohol Use No   Tobacco Use History[1] E-Cigarettes  . Vaping Use    . Start Date    . Cartridges/Day    . Quit Date     Social History   Substance and Sexual Activity  Drug Use No         Allergies[2]  Home Medications   ALBUTEROL SULFATE HFA (PROVENTIL,VENTOLIN,PROAIR) 108 (90 BASE) MCG/ACT INHALER    Inhale two puffs into the lungs every 2 (two) hours as needed for Shortness of Breath.   FERROUS GLUCONATE (IRON 27 PO)    Take 1 tablet by mouth daily.   NITROFURANTOIN  MACROCRYSTAL (MACRODANTIN ) 50 MG CAPSULE    Take one capsule (50 mg dose) by mouth at bedtime.   PANTOPRAZOLE  SODIUM (PROTONIX ) 40 MG TABLET    Take one tablet (40 mg dose) by mouth daily.   VITAMIN D,  CHOLECALCIFEROL, PO    Take 1 capsule by mouth daily.    Primary Survey  Primary Survey  Review of Systems   Review of Systems  Constitutional:  Negative for chills and fever.  Respiratory:  Negative for cough and shortness of breath.   Gastrointestinal:  Negative for abdominal pain, nausea and vomiting.  Genitourinary:  Negative for dysuria.  Musculoskeletal:  Negative for back pain and neck pain.  Skin:  Negative for rash.  Neurological:  Negative for syncope and headaches.  Psychiatric/Behavioral:  Positive for confusion.     Physical Exam   ED Triage Vitals  BP   Pulse   Resp   SpO2   Temp     Physical Exam  Nursing note and vitals reviewed. Constitutional: She appears well-developed and well-nourished.  HENT:  Head: Normocephalic.    Mouth/Throat: Voice normal.  Eyes: EOM are intact. Pupils are equal, round, and reactive to light.  Neck: Normal range of motion and voice normal. Neck supple.  Cardiovascular: Normal rate, regular rhythm, normal heart sounds and intact distal pulses.  Pulmonary/Chest: Respiratory effort normal and breath sounds normal.  Abdominal: Soft. There is no abdominal tenderness. There  is no guarding. Bowel sounds are normal.  Musculoskeletal: Normal range of motion.     Cervical back: Normal range of motion and neck supple. no edema.  Neurological: She is alert and oriented to person, place, and time. Moves all extremities equally. She has normal speech. Strength 5/5 bilateral upper and lower extremities.  Skin: Skin is warm. Skin is dry.  Psychiatric: She has a normal mood and affect.     ED Course   Lab results: No data to display  Imaging: No data to display   ECG: ECG Results          ECG 12 lead (In process)  Result time 12/20/23 16:52:26    In process             Narrative:   Diagnosis Class Abnormal Acquisition Device D3K Systolic BP 173 Diastolic BP 83 Ventricular Rate 111 Atrial Rate 111 P-R Interval  156 QRS Duration 98 Q-T Interval 338 QTC Calculation(Bazett) 459 Calculated P Axis 62 Calculated R Axis -7 Calculated T Axis 120  Diagnosis Sinus tachycardia Left ventricular hypertrophy with repolarization abnormality Abnormal ECG When compared with ECG of 24-Aug-2017 11:58, T wave inversion now evident in Lateral leads                                                                                          Pre-Sedation Lac Repair  Date/Time: 12/20/2023 5:33 PM  Performed by: Prentice Left, MD Authorized by: Prentice Left, MD   Consent:    Consent obtained:  Verbal   Consent given by:  Patient   Risks discussed:  Pain, need for additional repair, poor wound healing, poor cosmetic result and infection   Alternatives discussed:  No treatment, delayed treatment, observation and referral Universal protocol:    Procedure explained and questions answered to patient or proxy's satisfaction: YES     Relevant documents present and verified: YES     Site/side marked: YES     Immediately prior to procedure, a time out was called: YES   A time out verifies correct patient, procedure, equipment/supplies available, support staff, safety concerns (including but not limited to fire safety, estimated blood loss, appropriate positioning, etc.) and site/side marked as required.   Patient identity confirmed:  Verbally with patient and arm band Anesthesia (see MAR for exact dosages):    Anesthesia method:  Local infiltration   Local anesthetic:  Lidocaine  1% WITH epi Laceration details:    Location:  Scalp   Length (cm):  1 Repair type:    Repair type:  Simple Pre-procedure details:    Preparation:  Patient was prepped and draped in usual sterile fashion and imaging obtained to evaluate for foreign bodies Exploration:    Hemostasis achieved with:  Direct pressure and epinephrine    Wound extent: areolar tissue violated     Wound extent: no  foreign body, no nerve damage, no tendon damage and no underlying fracture     Contaminated: no   Treatment:    Area cleansed with:  Betadine   Amount of cleaning:  Standard   Irrigation solution:  Sterile saline and tap water    Irrigation method:  Tap   Visualized foreign bodies/material removed: no   Skin repair:    Repair method:  Staples Approximation:    Approximation:  Close Post-procedure details:  Post-procedure details:  Was the procedure successful? Yes. Post procedure diagnosis: Successful laceration repair. Estimated Blood loss: Minimal Specimen Collected? No.     Dressing:  Antibiotic ointment   Patient tolerance of procedure:  Tolerated well, no immediate complications   ED Course as of 12/20/23 2030  Elsie ORN Cupo's Documentation  Thu Dec 20, 2023  2026 Urinalysis w/ Reflex Microscopic; Reflex to Culture - Symptomatic(!)  2026 POCT Glucose(!)  2026 Culture, Urine Urine Urine, Clean Catch  2026 Comprehensive metabolic panel(!)  2026 CBC And Differential(!)   Medical Decision Making The patient states that she still felt quite lightheaded had a headache and was quite weak she has a urinary tract infection and elevated white count will be begun an course of ceftriaxone  and admitted to the hospital service for further management of syncope, closed head injury, urinary tract infection with possible sepsis  Problems Addressed: Closed head injury, initial encounter: acute illness or injury Fall, initial encounter: acute illness or injury  Amount and/or Complexity of Data Reviewed External Data Reviewed:     Details:   IMPRESSION:  CT HEAD 1.  No acute intracranial abnormality identified. 2.  No calvarial fracture identified.   CT SPINE CERVICAL  1.  No acute fracture or acute traumatic malalignment identified.  2.  No paraspinal hematoma identified.   XR CHEST No acute cardiopulmonary disease identified.    Labs: ordered. Decision-making details  documented in ED Course. Radiology: ordered and independent interpretation performed. Decision-making details documented in ED Course.    Details: I personally viewed and interpreted CT images of the head: ECG/medicine tests: ordered and independent interpretation performed. Decision-making details documented in ED Course.  Risk Prescription drug management. Decision regarding hospitalization.          Provider Communication  New Prescriptions   No medications on file    Modified Medications   No medications on file    Discontinued Medications   No medications on file    Clinical Impression Final diagnoses:  None    ED Disposition     None                 Electronically signed by:       [1] Social History Tobacco Use  Smoking Status Never  Smokeless Tobacco Never  [2] Allergies Allergen Reactions  . Ciprofloxacin Seizures  . Oxybutynin Other    seizure  . Sulfa Antibiotics Seizures and Unknown    Seizures  . Lamotrigine Unknown   Elsie ORN Peon, MD 12/21/23 KENITH

## 2023-12-21 DIAGNOSIS — R55 Syncope and collapse: Secondary | ICD-10-CM | POA: Diagnosis not present

## 2023-12-22 DIAGNOSIS — R55 Syncope and collapse: Secondary | ICD-10-CM | POA: Diagnosis not present

## 2023-12-23 NOTE — Discharge Summary (Addendum)
 [facility]  [facility] Inpatient Discharge Summary  PCP: Debby JINNY Petties, MD Discharge Details   Admit date:         12/20/2023 Discharge date:        12/23/2023  Hospital Days:    3 days  Code Status:   Full Code Advanced Directives on file: No Directive        Discharge Diagnoses:  Principal Problem:   Syncope and collapse Active Problems:   Severe obesity (*)   Acute pyelonephritis due to bacteria   Sepsis without acute organ dysfunction (*)   Hyperglycemia   Task list for follow-up: Follow-up with primary care physician 1 week.  Unresulted [employer] Current Status   Culture, Blood Blood Vein Preliminary result   Culture, Blood Blood Vein Preliminary result       Follow-Up Appointments Suggested: Debby JINNY Petties, MD [phone] [facility] [address] [place] [address] [phone]  Schedule an appointment as soon as possible for a visit in 1 week(s)   Follow-Up Appointments Already Scheduled: No future appointments.  Discharge Medications:   Current Discharge Medication List     START taking these medications      Details  cephALEXin  500 mg capsule Commonly known as: KEFLEX   Take one capsule (500 mg dose) by mouth 4 (four) times daily for 5 days. Quantity: 20 capsule       CONTINUE these medications which have NOT CHANGED      Details  BIOTIN PO  Take 1 capsule by mouth daily.   IRON 27 PO  Take 1 tablet by mouth daily.   VITAMIN D (CHOLECALCIFEROL) PO  Take 1 capsule by mouth daily.      * You might also be taking other medications not listed above. If you have questions about any of your other medications, talk to the person who prescribed them or your Primary Care Provider.          STOP taking these medications    nitrofurantoin  macrocrystal 50 mg capsule Commonly known as: MACRODANTIN        ASK your doctor about these medications      Details  albuterol sulfate HFA  108 (90 Base) MCG/ACT inhaler Commonly known as: PROVENTIL,VENTOLIN,PROAIR  Inhale two puffs into the lungs every 2 (two) hours as needed for Shortness of Breath. Quantity: 18 g   pantoprazole  sodium 40 mg tablet Commonly known as: PROTONIX   Take one tablet (40 mg dose) by mouth daily. Quantity: 30 tablet        Allergies: Allergies[1]  Consultations this Admission: None  Procedures/Imaging:     Echocardiogram Complete WO Enhancing Agent  Final Result  Left Ventricle: Systolic function is low normal. EF: 50-55%.   Quantitative analysis of left ventricular Global Longitudinal Strain (GLS)   imaging is -15.900%, which is abnormal. Ejection fraction measured by 3D   is 54%, which is normal. Wall motion is normal.  .  Aortic Valve: The aortic valve is tricuspid. The leaflets exhibit   normal excursion. Mild aortic valve regurgitation.  .  Mitral Valve: There is mild regurgitation.  .  Tricuspid Valve: There is trace regurgitation.      US  Carotid Bilateral  Final Result  IMPRESSION:  1.  Mild bilateral bifurcation plaque. No hemodynamically significant stenosis on either side.    2.  Both vertebral arteries are patent with antegrade flow.    Degree of stenosis is determined using NASCET measurement technique:  Severe:  70-90%  Moderate:  50-69%  Mild:  Less than 50%    Normal:  PSV < 125 cm/s with no visible plaque  Less than 50% stenosis: PSV < 180 cm/s   50-69% stenosis: PSV >= 180 cm/s  70-99% stenosis: PSV >= 230 cm/s AND EDV >= 100 cm/s  occluded: No visible flow in the ICA    For an internal carotid stenosis over 50%, a PSV >=180 cm/s has a positive predictive value of approximately 70% and a negative predictive value of approximately 96%.    For an internal carotid stenosis over 70%, a PSV >= 230 cm/s AND an EDV >= 100 cm/s has a positive predictive value of approximately 50% and a negative predictive value of approximately 96%.     Electronically Signed  by: Sheppard Charm, MD on 12/21/2023 11:23 AM    US  Venous Lower Extremity Bilateral  Final Result  IMPRESSION:  No evidence of deep venous thrombosis.    Electronically Signed by: Sheppard Charm, MD on 12/21/2023 8:16 AM    CT Abdomen Pelvis WO IV Contrast  Final Result  IMPRESSION:  CT CHEST  1.  No acute pulmonary embolus or thoracic aortic dissection identified.  2.  Negative for acute pulmonary infiltrate or pleural effusion.    CT ABDOMEN PELVIS  1.  No acute process identified.  2.  Negative for hydronephrosis or urinary calculus.  3.  Chronic nonemergent findings.            Electronically Signed by: Charlie Patch, MD on 12/20/2023 11:30 PM    CT Angio Chest Pulmonary  Final Result  IMPRESSION:  CT CHEST  1.  No acute pulmonary embolus or thoracic aortic dissection identified.  2.  Negative for acute pulmonary infiltrate or pleural effusion.    CT ABDOMEN PELVIS  1.  No acute process identified.  2.  Negative for hydronephrosis or urinary calculus.  3.  Chronic nonemergent findings.            Electronically Signed by: Charlie Patch, MD on 12/20/2023 11:30 PM    Xr Hand 3+ Views Right  Final Result  IMPRESSION:    No acute abnormality.    Electronically Signed by: Adine Fleeta Bang on 12/20/2023 9:57 PM    XR Chest Ap Portable  Final Result  IMPRESSION:   CT HEAD  1.  No acute intracranial abnormality identified.  2.  No calvarial fracture identified.    CT SPINE CERVICAL   1.  No acute fracture or acute traumatic malalignment identified.   2.  No paraspinal hematoma identified.    XR CHEST  No acute cardiopulmonary disease identified.        Electronically Signed by: Charlie Patch, MD on 12/20/2023 6:02 PM    CT Spine Cervical WO Contrast  Final Result  IMPRESSION:   CT HEAD  1.  No acute intracranial abnormality identified.  2.  No calvarial fracture identified.    CT SPINE CERVICAL   1.  No acute fracture or acute traumatic  malalignment identified.   2.  No paraspinal hematoma identified.    XR CHEST  No acute cardiopulmonary disease identified.        Electronically Signed by: Charlie Patch, MD on 12/20/2023 6:02 PM    CT Head WO Contrast  Final Result  IMPRESSION:   CT HEAD  1.  No acute intracranial abnormality identified.  2.  No calvarial fracture identified.    CT SPINE CERVICAL   1.  No acute fracture  or acute traumatic malalignment identified.   2.  No paraspinal hematoma identified.    XR CHEST  No acute cardiopulmonary disease identified.        Electronically Signed by: Charlie Patch, MD on 12/20/2023 6:02 PM      Pertinent Labs:  Cardiac Labs: No results for input(s): CK, CKMB, CTNI, BNP in the last 168 hours. CBC: Recent Labs    Units 12/23/23 0321 12/22/23 0459 12/21/23 0133  WBC thou/mcL 10.0 7.9 14.2*  HGB gm/dL 87.2 88.2 87.6  PLT thou/mcL 341 335 360   BMP: Recent Labs    Units 12/23/23 0321 12/22/23 0459 12/21/23 0133 12/20/23 1735  NA mmol/L 138 142 134* 137  K mmol/L 4.2 4.1 4.5 4.4  CL mmol/L 105 108 101 104  CO2 mmol/L 21 24 24 20   BUN mg/dL 18 17 19 23   CREATININE mg/dL 9.24 9.16 9.22 9.23   Lipid Panel: No results for input(s): CHOL, TRIG, HDL, LDL in the last 168 hours. Liver Enzymes: Recent Labs    Units 12/20/23 1735  AST U/L 21  ALT U/L 11  ALKPHOS U/L 126  BILITOT mg/dL 0.7   Endocrine Panels: Recent Labs    Units 12/23/23 0759 12/23/23 0321 12/22/23 1652 12/22/23 1217 12/22/23 0800 12/22/23 0459 12/21/23 2004 12/21/23 1725 12/21/23 0819 12/21/23 0133 12/20/23 2224 12/20/23 1735 12/20/23 1650  HGBA1C %  --   --   --   --   --   --   --   --   --   --  6.3*  --   --   GLUCOSE mg/dL 90 96 92 88 94 894* 899* 97 160* 105*  --  220* [facility]   Physicians involved in care during this hospitalization Attending Provider: Prentice Left, MD Attending Provider: Elsie LELON Peon, MD Attending  Provider: Yancy Sorrel, MD Attending Provider: Thayne GORMAN Blanch, MD Attending Provider: Atlee Abernethy, MD Attending Provider: Peggy JONETTA Cambric, MD Admitting Provider: Yancy Sorrel, MD Consulting Physician: Yancy Sorrel, MD Consulting Physician: Cathlean FORBES Lanius, MD Consulting Physician: Thayne GORMAN Blanch, MD  HPI per admitting provider: Kellee May is a 73 y.o. White or Caucasian [1] female with: A past medical history of recurrent UTIs who presented to the hospital after a syncopal episode.  On initial evaluation was found to have an acute urinary tract infection  Hospital Course:    No notes on file Urinary tract infection.  Imaging shows no complication or suggestion of pyelonephritis.  Urine culture shows E. coli.  Patient is currently hemodynamically stable.  Send disease noted.  Transition to oral antimicrobials on discharge.   Syncope and collapse.  No arrhythmias on telemetry.  Echocardiogram shows preserved ejection fraction, no wall motion abnormalities.  No orthostasis.  Orthostatic vital signs showed systolic blood pressure change right at the limit of  formal diagnosis of orthostatic hypotension.  Relatively asymptomatic.  Recommend and will provide compression stockings   Morbid obesity.  Recommend diet and lifestyle modification.   A1c of 6.3.  Recommend continued monitoring with primary care physician  BP (!) 137/54 (BP Location: Right Upper Arm, Patient Position: Lying)   Pulse 57   Temp 98 F (36.7 C) (Oral)   Resp 18   Wt 86.2 kg (190 lb)   SpO2 95%   BMI 35.67 kg/m   Physical Exam Gen: No acute distress HEENT: [place]/AT,EOMI,PERRLA, oral cavity is moist, no pharyngeal erythema or exudates. Neck: No carotid bruit, no cervical lymphadenopathy, no  JVD Lymphatics: No axillary or inguinal adenopathy Chest: Air entry is equal bilaterally.  No rubs, rales or rhonchi. CVS: S1-S2 is heard, regular rate rhythm, no murmurs rubs or gallops. Abdomen: Soft, nontender,  nondistended, no hepatosplenomegaly, bowel sounds heard in all 4 quadrants. Extremities: No pedal edema.  Peripheral pulses present and symmetric. Neurological, alert and oriented x3, sensory system is intact light touch, motor system is 5/5 in all 4 extremities. Post Hospital Care   Activity:   Weight Bearing Status:          Oxygen Orders for Discharge: O2 Device: None (Room air) SpO2: 95 % O2 Flow Rate (L/min): 0 L/min  Diet: Diet and Nourishment Orders (From admission, onward)     Start       12/20/23 2151  Regular Diet  Diet effective now                   Wound Care Recommendations:    Lines/Drains/Airways: Patient Lines/Drains/Airways Status     Active LDAs     Name Placement date Placement time Site Days   Peripheral IV 18 G Anterior;Left;Proximal Forearm 12/20/23  2108  Forearm  2            Therapy Recommendations:  PT:                OT:          SLP:              Home Health Orders: DME Orders (From admission, onward)    None      Home Health Agency     None       I spent 32 minutes performing discharge services.   Electronically signed: Peggy JONETTA Cambric, MD 12/23/2023 / 11:35 AM       [1] Allergies Allergen Reactions  . Ciprofloxacin Seizures  . Oxybutynin Other    seizure  . Sulfa Antibiotics Seizures and Unknown    Seizures  . Lamotrigine Unknown  *Some images could not be shown.

## 2023-12-24 ENCOUNTER — Telehealth: Payer: Self-pay | Admitting: Medical-Surgical

## 2023-12-24 ENCOUNTER — Telehealth: Payer: Self-pay

## 2023-12-24 NOTE — Telephone Encounter (Signed)
 Copied from CRM [id number]. Topic: Appointments - Scheduling Inquiry for Clinic >> Dec 24, 2023  1:10 PM Selena May wrote: Reason for CRM: Patient would like to make NP Selena May her new PCP, unable to schedule TOC appointment.[facility] states she will accept TOC patients of Dr. ONEIDA but [facility] is not allowing me to schedule appointment. Patient has hospital follow up appointment scheduled for this Wednesday, can [facility] appointment be set up during that time please?

## 2023-12-24 NOTE — Transitions of Care (Post Inpatient/ED Visit) (Signed)
 12/24/2023  Name: Selena May MRN: [id number] DOB: 10/14/1950  Today's TOC FU Call Status: Today's TOC FU Call Status:: Successful TOC FU Call Completed TOC FU Call Complete Date: 12/24/23 Patient's Name and Date of Birth confirmed.  Transition Care Management Follow-up Telephone Call Date of Discharge: 12/23/23 Discharge Facility: Other [facility]) Name of Other ([facility]) Discharge Facility: [facility] Type of Discharge: Inpatient Admission Primary Inpatient Discharge Diagnosis:: Syncope: UTI How have you been since you were released from the hospital?: Better Any questions or concerns?: No  Items Reviewed: Did you receive and understand the discharge instructions provided?: Yes Medications obtained,verified, and reconciled?: Yes (Medications Reviewed) Any new allergies since your discharge?: No Dietary orders reviewed?: NA Do you have support at home?: Yes People in Home [RPT]: alone Name of Support/Comfort Primary Source: [place]  Medications Reviewed Today: Medications Reviewed Today     Reviewed by Moises Reusing, RN (Case Manager) on 12/24/23 at 1440  Med List Status: <None>   Medication Order Taking? Sig Documenting Provider Last Dose Status Informant  BIOTIN PO [id number]  Take 1 tablet by mouth daily.  Patient not taking: Reported on 06/25/2023   [provider]  Active Self  BIOTIN PO [id number]  Take 1 capsule by mouth daily. [provider]  Active   cephALEXin  (KEFLEX ) 500 MG capsule [id number] Yes Take 500 mg by mouth 4 (four) times daily. For five days [provider]  Active   cholecalciferol (VITAMIN D3) 25 MCG (1000 UNIT) tablet [id number]  Take 1,000 Units by mouth daily. [provider]  Active   diclofenac  sodium (VOLTAREN ) 1 % GEL [id number]  Apply 4 g topically 4 (four) times daily. To affected joint.  Patient not taking: Reported on 11/07/2019   Corey, Evan S, MD  Active Self  ferrous sulfate  325 (65 FE)  MG tablet [id number]  Take 325 mg by mouth daily with breakfast. [provider]  Active Self  gabapentin  (NEURONTIN ) 300 MG capsule [id number]  Take 1 capsule (300 mg total) by mouth at bedtime. RESTLESS LEGS  Patient not taking: Reported on 01/31/2022   Alexander, Natalie, DO  Active Self  promethazine  (PHENERGAN ) 25 MG tablet [id number]  Take 1 tablet (25 mg total) by mouth every 6 (six) hours as needed for nausea or vomiting.  Patient not taking: Reported on 06/25/2023   Almarie Waddell NOVAK, NP  Active   traZODone  (DESYREL ) 50 MG tablet [id number]  Take 1-2 tablets (50-100 mg total) by mouth at bedtime as needed for sleep. Marsa Edelman, DO  Active Self            Home Care and Equipment/Supplies: Were Home Health Services Ordered?: NA Any new equipment or medical supplies ordered?: NA  Functional Questionnaire: Do you need assistance with bathing/showering or dressing?: No Do you need assistance with meal preparation?: No Do you need assistance with eating?: No Do you have difficulty maintaining continence: No Do you need assistance with getting out of bed/getting out of a chair/moving?: No Do you have difficulty managing or taking your medications?: No  Follow up appointments reviewed: PCP Follow-up appointment confirmed?: Yes Date of PCP follow-up appointment?: 12/26/23 Follow-up Provider: [facility] Follow-up appointment confirmed?: NA Do you need transportation to your follow-up appointment?: No Do you understand care options if your condition(s) worsen?: Yes-patient verbalized understanding  SDOH Interventions Today    Flowsheet Row Most Recent Value  SDOH Interventions   Food Insecurity Interventions Intervention Not Indicated  Housing Interventions Intervention Not  Indicated  Transportation Interventions Intervention Not Indicated  Utilities Interventions Intervention Not Indicated    Medford Balboa, BSN, RN [place]  VBCI  - Population Health RN Care Manager [phone]

## 2023-12-26 ENCOUNTER — Encounter: Payer: Self-pay | Admitting: Medical-Surgical

## 2023-12-26 ENCOUNTER — Ambulatory Visit (INDEPENDENT_AMBULATORY_CARE_PROVIDER_SITE_OTHER): Admitting: Medical-Surgical

## 2023-12-26 VITALS — BP 133/76 | HR 66 | Resp 20 | Ht 61.5 in | Wt 199.0 lb

## 2023-12-26 DIAGNOSIS — Z7689 Persons encountering health services in other specified circumstances: Secondary | ICD-10-CM | POA: Diagnosis not present

## 2023-12-26 DIAGNOSIS — N12 Tubulo-interstitial nephritis, not specified as acute or chronic: Secondary | ICD-10-CM | POA: Diagnosis not present

## 2023-12-26 DIAGNOSIS — R55 Syncope and collapse: Secondary | ICD-10-CM

## 2023-12-26 DIAGNOSIS — E66812 Obesity, class 2: Secondary | ICD-10-CM | POA: Insufficient documentation

## 2023-12-26 DIAGNOSIS — E611 Iron deficiency: Secondary | ICD-10-CM

## 2023-12-26 DIAGNOSIS — Z4802 Encounter for removal of sutures: Secondary | ICD-10-CM

## 2023-12-26 DIAGNOSIS — Z09 Encounter for follow-up examination after completed treatment for conditions other than malignant neoplasm: Secondary | ICD-10-CM | POA: Diagnosis not present

## 2023-12-26 MED ORDER — FERROUS SULFATE 325 (65 FE) MG PO TABS: 325.0000 mg | ORAL_TABLET | Freq: Every day | ORAL | Status: AC

## 2023-12-26 NOTE — Progress Notes (Signed)
        Established patient visit   History of Present Illness   Discussed the use of AI scribe software for clinical note transcription with the patient, who gave verbal consent to proceed.  History of Present Illness   Selena May is a 73 year old female who presents for staple removal from the back of her head and follow-up on a recent kidney infection.  She is also being seen to transfer care to a new PCP.  Head laceration and staple removal - Sustained a head injury after a a syncopal episode while at the grocery store - 3 Staples were placed in the back of the head following the injury - Presenting for staple removal  Pyelonephritis and syncope - Experienced a kidney infection for several weeks but was unable to get in with her nephrologist/urologist - Initial self-medication with leftover antibiotics was ineffective - Hospitalized for severe kidney infection with sepsis - Currently on a five-day course of Keflex , with four days remaining - Syncope occurred in the context of the kidney infection  Physical Exam   Physical Exam Vitals reviewed.  Constitutional:      General: She is not in acute distress.    Appearance: Normal appearance. She is not ill-appearing.  HENT:     Head: Normocephalic and atraumatic.      Comments: 3 intact staples at the area indicated.  Wound edges well-approximated with scabbing.  Staples removed without difficulty.  Patient tolerated well. Cardiovascular:     Rate and Rhythm: Normal rate and regular rhythm.     Pulses: Normal pulses.     Heart sounds: Normal heart sounds. No murmur heard.    No friction rub. No gallop.  Pulmonary:     Effort: Pulmonary effort is normal. No respiratory distress.     Breath sounds: Normal breath sounds. No wheezing.  Skin:    General: Skin is warm and dry.  Neurological:     Mental Status: She is alert and oriented to person, place, and time.  Psychiatric:        Mood and Affect: Mood normal.         Behavior: Behavior normal.        Thought Content: Thought content normal.        Judgment: Judgment normal.    Assessment & Plan     Encounter to establish care Previously seen by Dr. Curtis who is no longer at our practice.  Transferring care today.  Reviewed medical history and concerns and answered all questions at the time of the appointment.  Pyelonephritis/hospital discharge follow-up/syncope On 5-day Keflex  course for severe kidney infection. Concerns about antibiotic duration due to recurrent infections. - Complete current Keflex  course. - Order urinalysis with micro and reflex to culture post-antibiotic to assess for persistent infection. - Staples removed without difficulty.  Obesity Weight 199 lbs. Diet mainly TV dinners. Medicare does not cover weight loss medications. Discussed phentermine  and compounded injections; she is not interested in injections at this time. - Encourage dietary changes with increased protein. - Recommend strength training and isometric exercises.  Iron deficiency On iron supplementation. Dizziness if not taken regularly. Recent blood counts stable. - Continue current iron supplementation.     Follow up   Return in about 6 months (around 06/24/2024) for chronic disease follow up. __________________________________ Zada FREDRIK Palin, DNP, APRN, FNP-BC Primary Care and Sports Medicine [facility] [place]

## 2024-06-24 ENCOUNTER — Ambulatory Visit: Admitting: Medical-Surgical

## 2024-06-26 ENCOUNTER — Ambulatory Visit
